# Patient Record
Sex: Female | Born: 1970
Health system: Southern US, Community
[De-identification: ages and names within clinical notes are randomized; demographics above are authoritative.]

## PROBLEM LIST (undated history)

## (undated) DIAGNOSIS — M722 Plantar fascial fibromatosis: Secondary | ICD-10-CM

## (undated) DIAGNOSIS — E669 Obesity, unspecified: Secondary | ICD-10-CM

## (undated) DIAGNOSIS — E161 Other hypoglycemia: Secondary | ICD-10-CM

## (undated) DIAGNOSIS — I10 Essential (primary) hypertension: Secondary | ICD-10-CM

## (undated) DIAGNOSIS — Z8742 Personal history of other diseases of the female genital tract: Secondary | ICD-10-CM

## (undated) DIAGNOSIS — N96 Recurrent pregnancy loss: Secondary | ICD-10-CM

## (undated) HISTORY — DX: Recurrent pregnancy loss: N96

## (undated) HISTORY — DX: Obesity, unspecified: E66.9

## (undated) HISTORY — DX: Essential (primary) hypertension: I10

## (undated) HISTORY — DX: Personal history of other diseases of the female genital tract: Z87.42

## (undated) HISTORY — DX: Plantar fascial fibromatosis: M72.2

## (undated) HISTORY — DX: Other hypoglycemia: E16.1

## (undated) HISTORY — PX: GALLBLADDER SURGERY: SHX652

---

## 2005-06-10 HISTORY — PX: CHOLECYSTECTOMY: SHX55

## 2005-09-03 ENCOUNTER — Emergency Department: Payer: Self-pay | Admitting: Emergency Medicine

## 2005-09-03 ENCOUNTER — Other Ambulatory Visit: Payer: Self-pay

## 2005-09-26 ENCOUNTER — Ambulatory Visit: Payer: Self-pay | Admitting: General Surgery

## 2008-10-16 LAB — HM PAP SMEAR: HM Pap smear: NORMAL

## 2009-04-10 ENCOUNTER — Ambulatory Visit: Payer: Self-pay | Admitting: Internal Medicine

## 2009-06-10 DIAGNOSIS — E161 Other hypoglycemia: Secondary | ICD-10-CM

## 2009-06-10 HISTORY — DX: Other hypoglycemia: E16.1

## 2011-07-05 ENCOUNTER — Other Ambulatory Visit: Payer: Self-pay | Admitting: *Deleted

## 2011-07-05 NOTE — Telephone Encounter (Signed)
Patient has not been seen in the office yet, but she does have an appt scheduled in a couple of weeks. She is asking if she can get this refilled until she is seen.

## 2011-07-10 ENCOUNTER — Encounter: Payer: Self-pay | Admitting: Internal Medicine

## 2011-07-11 MED ORDER — ZOLPIDEM TARTRATE 5 MG PO TABS: 5.0000 mg | ORAL_TABLET | Freq: Every evening | ORAL | Status: DC | PRN

## 2011-07-11 NOTE — Telephone Encounter (Signed)
Called to [facility]. 

## 2011-07-11 NOTE — Telephone Encounter (Signed)
Refill authorized.

## 2011-07-17 ENCOUNTER — Encounter: Payer: Self-pay | Admitting: Internal Medicine

## 2011-07-17 ENCOUNTER — Telehealth: Payer: Self-pay | Admitting: *Deleted

## 2011-07-17 ENCOUNTER — Ambulatory Visit (INDEPENDENT_AMBULATORY_CARE_PROVIDER_SITE_OTHER): Payer: PRIVATE HEALTH INSURANCE | Admitting: Internal Medicine

## 2011-07-17 DIAGNOSIS — I10 Essential (primary) hypertension: Secondary | ICD-10-CM | POA: Insufficient documentation

## 2011-07-17 DIAGNOSIS — Z6841 Body Mass Index (BMI) 40.0 and over, adult: Secondary | ICD-10-CM | POA: Insufficient documentation

## 2011-07-17 DIAGNOSIS — E669 Obesity, unspecified: Secondary | ICD-10-CM

## 2011-07-17 DIAGNOSIS — H6690 Otitis media, unspecified, unspecified ear: Secondary | ICD-10-CM

## 2011-07-17 MED ORDER — HYDROCORTISONE 0.5 % EX OINT: TOPICAL_OINTMENT | Freq: Two times a day (BID) | CUTANEOUS | Status: AC

## 2011-07-17 NOTE — Progress Notes (Signed)
  Subjective:    Patient ID: Melanie Chavez, female    DOB: [birth year], 41 y.o.   MRN: [id number]  HPI  Melanie Chavez is a 41 year old African American female with a history of reactive hypoglycemia secondary to diet, morbid obesity, migraine headaches, and hypertension who presents for followup. She was last seen one year ago by me at [facility]. Her chief complaint today is obesity. She has made some effort to restrict her diet by cutting back on fast food however she continues to snack on pretzels and crackers albeit whole-wheat, and has not begun exercising regularly yet. She and her 17 year old mother are talking about starting an exercise program but had not actually started. She has lost 5 pounds over the last year.   Past Medical History  Diagnosis Date  . Hypertension   . Obesity (BMI 30-39.9)   . H/O pelvic inflammatory disease   . Plantar fasciitis   . Reactive hypoglycemia 2011  . History of recurrent miscarriages, not currently pregnant     Past Surgical History  Procedure Date  . Cholecystectomy 2007    [facility]nett    Review of Systems  Constitutional: Negative for fever, chills and unexpected weight change.  HENT: Negative for hearing loss, ear pain, nosebleeds, congestion, sore throat, facial swelling, rhinorrhea, sneezing, mouth sores, trouble swallowing, neck pain, neck stiffness, voice change, postnasal drip, sinus pressure, tinnitus and ear discharge.   Eyes: Negative for pain, discharge, redness and visual disturbance.  Respiratory: Negative for cough, chest tightness, shortness of breath, wheezing and stridor.   Cardiovascular: Negative for chest pain, palpitations and leg swelling.  Musculoskeletal: Negative for myalgias and arthralgias.  Skin: Negative for color change and rash.  Neurological: Negative for dizziness, weakness, light-headedness and headaches.  Hematological: Negative for adenopathy.    BP 146/100  Pulse 84  Temp(Src) 98.4 F  (36.9 C) (Oral)  Ht 5' 8.25" (1.734 m)  Wt 261 lb (118.389 kg)  BMI 39.39 kg/m2  SpO2 100%  LMP 06/30/2011      Objective:   Physical Exam  Constitutional: She is oriented to person, place, and time. She appears well-developed and well-nourished.  HENT:  Mouth/Throat: Oropharynx is clear and moist.  Eyes: EOM are normal. Pupils are equal, round, and reactive to light. No scleral icterus.  Neck: Normal range of motion. Neck supple. No JVD present. No thyromegaly present.  Cardiovascular: Normal rate, regular rhythm, normal heart sounds and intact distal pulses.   Pulmonary/Chest: Effort normal and breath sounds normal.  Abdominal: Soft. Bowel sounds are normal. She exhibits no mass. There is no tenderness.  Musculoskeletal: Normal range of motion. She exhibits no edema.  Lymphadenopathy:    She has no cervical adenopathy.  Neurological: She is alert and oriented to person, place, and time.  Skin: Skin is warm and dry.  Psychiatric: She has a normal mood and affect.      Assessment & Plan:

## 2011-07-17 NOTE — Patient Instructions (Signed)
Go out and get the Dr. Vickey Sages New Diet Revolution book to read about the low carbohydrate diet.   Eat every 3 hours while awake to keep your metabolism going.    Consider almonds or roasted peanuts as a snack    Consider a lower carb bread; here are some choices available at [facility] and [employer]  Double fiber whole Wheat Sandwich thins;  They toast well. Jamse Belfast makes a pita bread and a flat bread that are 4 net carbs Mission makes a 6 net carb whole wheat tortilla  Consider the Atkins or the EAS Carb Control protein shakes as a breakfast or snack choice. ( [employer])  Use the Atkins or Fiber  one or Special K bars as your 3 hr in  between snack.  (or 1or 2 oz of nuts)

## 2011-07-17 NOTE — Telephone Encounter (Signed)
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

## 2011-07-17 NOTE — Telephone Encounter (Signed)
Patient is asking if you want her to return for labs before her cpx in may. If so what labs would you like for me to draw.

## 2011-07-18 ENCOUNTER — Encounter: Payer: Self-pay | Admitting: Internal Medicine

## 2011-07-18 DIAGNOSIS — M7672 Peroneal tendinitis, left leg: Secondary | ICD-10-CM | POA: Insufficient documentation

## 2011-07-18 DIAGNOSIS — E161 Other hypoglycemia: Secondary | ICD-10-CM | POA: Insufficient documentation

## 2011-07-18 DIAGNOSIS — N96 Recurrent pregnancy loss: Secondary | ICD-10-CM | POA: Insufficient documentation

## 2011-07-18 DIAGNOSIS — Z8742 Personal history of other diseases of the female genital tract: Secondary | ICD-10-CM | POA: Insufficient documentation

## 2011-07-18 NOTE — Telephone Encounter (Signed)
Left message asking patient to call me back

## 2011-07-18 NOTE — Assessment & Plan Note (Signed)
Her major obstacles to managing her obesity are lack of education about proper diet and lack of daily exercise. She is clearly motivated. I spent 30 minutes today discussing her diet with her and helping her choose a low carbohydrate diet gave given her history of of reactive hypoglycemia and excess carbohydrate consumption. I recommended that she obtain the Akin's new diet resolution paper back book for guidance and meal  planning and we'll see her back in 3 months for her annual physical. We will repeat a hemoglobin A1c fasting lipids and a cement prior to that visit.

## 2011-07-18 NOTE — Assessment & Plan Note (Addendum)
No controlled on current regimen. She has been asked to keep your blood pressure checked by employee health several times over the next month and to fax me the record the results. She's currently taking metoprolol 50 mg twice daily and lisinopril 20 mg daily we will consider increasing either medication based on the results of her repeat testing. She has normal renal function.

## 2011-07-22 NOTE — Telephone Encounter (Signed)
Left another message asking patient to call me back. 

## 2011-08-01 NOTE — Telephone Encounter (Signed)
Left message asking patient to call the office for lab appt or if she wants order for the hospital.

## 2011-08-12 ENCOUNTER — Other Ambulatory Visit: Payer: Self-pay | Admitting: Internal Medicine

## 2011-08-13 MED ORDER — ZOLPIDEM TARTRATE 5 MG PO TABS: 5.0000 mg | ORAL_TABLET | Freq: Every evening | ORAL | Status: DC | PRN

## 2011-10-17 ENCOUNTER — Encounter: Payer: Self-pay | Admitting: Internal Medicine

## 2011-10-17 ENCOUNTER — Ambulatory Visit (INDEPENDENT_AMBULATORY_CARE_PROVIDER_SITE_OTHER): Payer: PRIVATE HEALTH INSURANCE | Admitting: Internal Medicine

## 2011-10-17 ENCOUNTER — Telehealth: Payer: Self-pay | Admitting: Internal Medicine

## 2011-10-17 VITALS — BP 136/86 | HR 75 | Temp 98.3°F | Resp 16 | Wt 254.8 lb

## 2011-10-17 DIAGNOSIS — Z0000 Encounter for general adult medical examination without abnormal findings: Secondary | ICD-10-CM

## 2011-10-17 DIAGNOSIS — E669 Obesity, unspecified: Secondary | ICD-10-CM

## 2011-10-17 DIAGNOSIS — E162 Hypoglycemia, unspecified: Secondary | ICD-10-CM

## 2011-10-17 DIAGNOSIS — E161 Other hypoglycemia: Secondary | ICD-10-CM

## 2011-10-17 DIAGNOSIS — I10 Essential (primary) hypertension: Secondary | ICD-10-CM

## 2011-10-17 DIAGNOSIS — Z124 Encounter for screening for malignant neoplasm of cervix: Secondary | ICD-10-CM

## 2011-10-17 MED ORDER — METOPROLOL TARTRATE 50 MG PO TABS: 50.0000 mg | ORAL_TABLET | Freq: Two times a day (BID) | ORAL | Status: DC

## 2011-10-17 MED ORDER — LISINOPRIL 20 MG PO TABS: 20.0000 mg | ORAL_TABLET | Freq: Two times a day (BID) | ORAL | Status: DC

## 2011-10-17 MED ORDER — NORETHIN-ETH ESTRAD-FE BIPHAS 1 MG-10 MCG / 10 MCG PO TABS: 1.0000 | ORAL_TABLET | Freq: Every day | ORAL | Status: DC

## 2011-10-17 MED ORDER — ZOLPIDEM TARTRATE 5 MG PO TABS: 5.0000 mg | ORAL_TABLET | Freq: Every evening | ORAL | Status: DC | PRN

## 2011-10-17 NOTE — Assessment & Plan Note (Signed)
Repeat a1c ordered.

## 2011-10-17 NOTE — Assessment & Plan Note (Addendum)
Breast , pelvic/PAP done.  Mammogram ordered,  Her first.  No first degrees realtives with CA.  Vaginal culture for yeast was sent as  she had white dc ,  No history of sexual encounters in years.

## 2011-10-17 NOTE — Telephone Encounter (Signed)
Mammogram scheduled for 10/24/11 @ 7:30 arrive @7 :15  Left message on phone # [phone]

## 2011-10-17 NOTE — Progress Notes (Signed)
Patient ID: Melanie Chavez, female   DOB: [birth year], 41 y.o.   MRN: [id number]  Patient Active Problem List  Diagnoses  . Obesity (BMI 30-39.9)  . Hypertension  . H/O pelvic inflammatory disease  . Plantar fasciitis  . Reactive hypoglycemia  . History of recurrent miscarriages, not currently pregnant  . Routine general medical examination at a health care facility    Subjective:  CC:   Chief Complaint  Patient presents with  . Gynecologic Exam    HPI:   Melanie Randolphis a 41 y.o. female who presents For her Annual PE.  Her Last PAP smear was normal in 2010 by [place] and she is not sexually active.  She has been losing weight intentionally by exercising and reducing her caloric intake. Her entire family is involved, Her menstrual periods are regular but heavy and lasting a full 7 days .  She is wearing her seatbelts whenever driving or at passenger in a car. She is getting 7-8 hours sleep per night. She does not smoke. She does not drink alcohol. She has no history of current physical or domestic violence   Past Medical History  Diagnosis Date  . Hypertension   . Obesity (BMI 30-39.9)   . H/O pelvic inflammatory disease   . Plantar fasciitis   . Reactive hypoglycemia 2011  . History of recurrent miscarriages, not currently pregnant     Past Surgical History  Procedure Date  . Cholecystectomy 2007    [facility]nett         The following portions of the patient's history were reviewed and updated as appropriate: Allergies, current medications, and problem list.    Review of Systems:   12 Pt  review of systems was negative except those addressed in the HPI,     History   Social History  . Marital Status: Single    Spouse Name: N/A    Number of Children: N/A  . Years of Education: N/A   Occupational History  . Not on file.   Social History Main Topics  . Smoking status: Never Smoker   . Smokeless tobacco: Never Used  . Alcohol Use: Not on file  .  Drug Use: Not on file  . Sexually Active: Not on file   Other Topics Concern  . Not on file   Social History Narrative  . No narrative on file    Objective:  BP 136/86  Pulse 75  Temp(Src) 98.3 F (36.8 C) (Oral)  Resp 16  Wt 254 lb 12 oz (115.554 kg)  SpO2 96%  LMP 08/26/2011  General appearance: alert, cooperative and appears stated age Ears: normal TM's and external ear canals both ears Throat: lips, mucosa, and tongue normal; teeth and gums normal Neck: no adenopathy, no carotid bruit, supple, symmetrical, trachea midline and thyroid not enlarged, symmetric, no tenderness/mass/nodules Back: symmetric, no curvature. ROM normal. No CVA tenderness. Lungs: clear to auscultation bilaterally Heart: regular rate and rhythm, S1, S2 normal, no murmur, click, rub or gallop Abdomen: soft, non-tender; bowel sounds normal; no masses,  no organomegaly Pulses: 2+ and symmetric Skin: Skin color, texture, turgor normal. No rashes or lesions Lymph nodes: Cervical, supraclavicular, and axillary nodes normal.  Assessment and Plan:  Reactive hypoglycemia Repeat a1c ordered.   Routine general medical examination at a health care facility Breast , pelvic/PAP done.  Mammogram ordered,  Her first.  No first degrees realtives with CA.  Vaginal culture for yeast was sent as  she had white dc ,  No  history of sexual encounters in years.   Obesity (BMI 30-39.9) I congratulated her on her success in losing a few pounds over the last several months. We reviewed a low glycemic index diet again especially in light of her episodes of hypoglycemia which were occurring as of last year.  Hypertension Well controlled on current medications.  No changes today.     Updated Medication List Outpatient Encounter Prescriptions as of 10/17/2011  Medication Sig Dispense Refill  . hydrocortisone ointment 0.5 % Apply topically 2 (two) times daily.  30 g  0  . lisinopril (PRINIVIL,ZESTRIL) 20 MG tablet Take 1  tablet (20 mg total) by mouth 2 (two) times daily.  60 tablet  6  . metoprolol (LOPRESSOR) 50 MG tablet Take 1 tablet (50 mg total) by mouth 2 (two) times daily.  60 tablet  6  . zolpidem (AMBIEN) 5 MG tablet Take 1 tablet (5 mg total) by mouth at bedtime as needed for sleep.  30 tablet  3  . DISCONTD: lisinopril (PRINIVIL,ZESTRIL) 20 MG tablet Take 20 mg by mouth 2 (two) times daily.      [name] DISCONTD: metoprolol (LOPRESSOR) 50 MG tablet Take 50 mg by mouth 2 (two) times daily.      [name] DISCONTD: zolpidem (AMBIEN) 5 MG tablet Take 1 tablet (5 mg total) by mouth at bedtime as needed for sleep.  30 tablet  3  . Norethindrone-Ethinyl Estradiol-Fe Biphas (LO LOESTRIN FE) 1 MG-10 MCG / 10 MCG tablet Take 1 tablet by mouth daily.  1 Package  11     Orders Placed This Encounter  Procedures  . HM PAP SMEAR    No Follow-up on file.

## 2011-10-18 ENCOUNTER — Other Ambulatory Visit (HOSPITAL_COMMUNITY)
Admission: RE | Admit: 2011-10-18 | Discharge: 2011-10-18 | Disposition: A | Discharge status: Home / Self Care | Payer: PRIVATE HEALTH INSURANCE | Source: Ambulatory Visit | Attending: Internal Medicine | Admitting: Internal Medicine

## 2011-10-18 DIAGNOSIS — Z01419 Encounter for gynecological examination (general) (routine) without abnormal findings: Secondary | ICD-10-CM | POA: Insufficient documentation

## 2011-10-18 DIAGNOSIS — Z1159 Encounter for screening for other viral diseases: Secondary | ICD-10-CM | POA: Insufficient documentation

## 2011-10-20 NOTE — Assessment & Plan Note (Signed)
I congratulated her on her success in losing a few pounds over the last several months. We reviewed a low glycemic index diet again especially in light of her episodes of hypoglycemia which were occurring as of last year.

## 2011-10-20 NOTE — Assessment & Plan Note (Signed)
Well controlled on current medications.  No changes today. 

## 2011-10-21 LAB — HM PAP SMEAR: HM Pap smear: NORMAL

## 2011-10-31 ENCOUNTER — Ambulatory Visit: Payer: Self-pay | Admitting: Internal Medicine

## 2011-11-01 ENCOUNTER — Telehealth: Payer: Self-pay | Admitting: *Deleted

## 2011-11-01 NOTE — Telephone Encounter (Signed)
opened in error

## 2011-11-18 ENCOUNTER — Telehealth: Payer: Self-pay | Admitting: Internal Medicine

## 2011-11-18 DIAGNOSIS — N924 Excessive bleeding in the premenopausal period: Secondary | ICD-10-CM

## 2011-11-18 NOTE — Telephone Encounter (Signed)
I would like her to see a gynecologist.  Does she have a preference?

## 2011-11-18 NOTE — Telephone Encounter (Signed)
Patient called and stated she has been on her period for 2 weeks now, she stated you prescribed her a birth control pill but it is not working.  She wanted to know what she needed to do now.  Please advise.

## 2011-11-19 NOTE — Telephone Encounter (Signed)
Patient notified, she stated she does not have a preference.  Please advise.

## 2011-11-19 NOTE — Telephone Encounter (Signed)
I have made a referral to Dr. Patton Salles at [facility]

## 2011-12-04 ENCOUNTER — Other Ambulatory Visit: Payer: Self-pay | Admitting: Internal Medicine

## 2011-12-05 MED ORDER — ZOLPIDEM TARTRATE 5 MG PO TABS: 5.0000 mg | ORAL_TABLET | Freq: Every evening | ORAL | Status: DC | PRN

## 2011-12-05 NOTE — Telephone Encounter (Signed)
Rx called to pharmacy

## 2012-04-08 ENCOUNTER — Telehealth: Payer: Self-pay | Admitting: Internal Medicine

## 2012-04-08 MED ORDER — NORETHIN ACE-ETH ESTRAD-FE 1.5-30 MG-MCG PO TABS: 1.0000 | ORAL_TABLET | Freq: Every day | ORAL | Status: DC

## 2012-04-08 NOTE — Telephone Encounter (Signed)
Done, sent to [facility]

## 2012-04-08 NOTE — Telephone Encounter (Signed)
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

## 2012-04-09 NOTE — Telephone Encounter (Signed)
Notified patient that Rx has been changed and sent to [facility] pharmacy.

## 2012-04-10 ENCOUNTER — Other Ambulatory Visit: Payer: Self-pay | Admitting: Internal Medicine

## 2012-04-10 ENCOUNTER — Telehealth: Payer: Self-pay | Admitting: Internal Medicine

## 2012-04-10 MED ORDER — ZOLPIDEM TARTRATE 5 MG PO TABS: 5.0000 mg | ORAL_TABLET | Freq: Every evening | ORAL | Status: DC | PRN

## 2012-04-10 NOTE — Telephone Encounter (Signed)
Keep it at 5 mg.  thanks

## 2012-04-10 NOTE — Telephone Encounter (Signed)
Approved, please call in.

## 2012-04-10 NOTE — Telephone Encounter (Signed)
Request for controlled substance zolpidemtartrate 10 mg  30 tab Sig: take one tablet by mouth at bedtime as needed

## 2012-04-10 NOTE — Telephone Encounter (Signed)
Do you want to change the dose to 10 mg or leave it at 5 mg. Because under her medication list it says 5 mg.

## 2012-04-13 ENCOUNTER — Telehealth: Payer: Self-pay | Admitting: Internal Medicine

## 2012-04-13 NOTE — Telephone Encounter (Signed)
Pt is calling back and saying that her pharmacy never received the refill.

## 2012-04-13 NOTE — Telephone Encounter (Signed)
Error

## 2012-04-16 NOTE — Telephone Encounter (Signed)
Called [facility] pharmacy 11/6 and gave prescription over phone. Patient was notified.

## 2012-04-16 NOTE — Telephone Encounter (Signed)
[facility] said they received the fax for the patient's rx and it was faxed to the wrong place and wrong number ???

## 2012-08-12 ENCOUNTER — Telehealth: Payer: Self-pay | Admitting: General Practice

## 2012-08-12 MED ORDER — ZOLPIDEM TARTRATE 5 MG PO TABS: 5.0000 mg | ORAL_TABLET | Freq: Every evening | ORAL | Status: DC | PRN

## 2012-08-12 NOTE — Telephone Encounter (Signed)
Called stating she needs her Remus Loffler refilled there ar no more refills on her Rx. Pt uses [facility].

## 2012-08-12 NOTE — Telephone Encounter (Signed)
Ok to refill,  Authorized in epic 

## 2012-08-13 NOTE — Telephone Encounter (Signed)
Medication was faxed on 08/13/12 to [facility].

## 2012-10-29 ENCOUNTER — Ambulatory Visit (INDEPENDENT_AMBULATORY_CARE_PROVIDER_SITE_OTHER): Payer: 59 | Admitting: Internal Medicine

## 2012-10-29 ENCOUNTER — Encounter: Payer: Self-pay | Admitting: Internal Medicine

## 2012-10-29 VITALS — BP 132/82 | HR 75 | Temp 98.9°F | Resp 16 | Ht 65.0 in | Wt 246.8 lb

## 2012-10-29 DIAGNOSIS — Z13220 Encounter for screening for lipoid disorders: Secondary | ICD-10-CM

## 2012-10-29 DIAGNOSIS — E669 Obesity, unspecified: Secondary | ICD-10-CM

## 2012-10-29 DIAGNOSIS — Z0000 Encounter for general adult medical examination without abnormal findings: Secondary | ICD-10-CM

## 2012-10-29 DIAGNOSIS — R5381 Other malaise: Secondary | ICD-10-CM

## 2012-10-29 NOTE — Patient Instructions (Signed)
Come by ASAP for fasting bloodwork  Nystatin ointment for the rash ,  Use twice daily until resolved,  Then ok to use gold bond in the groin but not the vaginal fold.   Refills on meds once i check  your kidney function,  Etc    This is  my version of a  "Low GI"  Diet:  It is not ultra low carb, but will still lower your blood sugars and allow you to lose 5 to 10 lbs per month if you follow it carefully. All of the foods can be found at grocery stores and in bulk at [employer].  The Atkins protein bars and shakes are available in more varieties at [employer], [employer] and [employer].     7 AM Breakfast:  Low carbohydrate Protein  Shakes (I recommend the EAS AdvantEdge "Carb Control" shakes  Or the low carb shakes by Atkins.   Both are available everywhere:  In  cases at [employer]Js  Or in 4 packs at grocery stores and pharmacies  2.5 carbs  (Alternative is  a toasted Arnold's Sandwhich Thin w/ peanut butter, a "Bagel Thin" with cream cheese and salmon) or  a scrambled egg burrito made with a low carb tortilla .  Avoid cereal and bananas, oatmeal too unless you are cooking the old fashioned kind that takes 30-40 minutes to prepare.  the rest is overly processed, has minimal fiber, and is loaded with carbohydrates!   10 AM: Protein bar by Atkins (the snack size, under 200 cal).  There are many varieties , available widely again or in bulk in limited varieties at [employer]Js)  Other so called "protein bars" tend to be loaded with carbohydrates.  Remember, in food advertising, the word "energy" is synonymous for " carbohydrate."  Lunch: sandwich of [place], (or any lunchmeat, grilled meat or canned tuna), fresh avocado, mayonnaise  and cheese on a lower carbohydrate pita bread, flatbread, or tortilla . Ok to use regular mayonnaise. The bread is the only source or carbohydrate that can be decreased (Joseph's makes a pita bread and a flat bread that are 50 cal and 4 net carbs ; Toufayan makes a low carb flatbread that's  100 cal and 9 net carbs  and  Mission makes a low carb whole wheat tortilla  That is 210 cal and 6 net carbs)  3 PM:  Mid day :  Another protein bar,  Or a  cheese stick (100 cal, 0 carbs),  Or 1 ounce of  almonds, walnuts, pistachios, pecans, peanuts,  Macadamia nuts. Or a Dannon light n Fit greek yogurt, 80 cal 8 net carbs . Avoid "granola"; the dried cranberries and raisins are loaded with carbohydrates. Mixed nuts ok if no raisins or cranberries or dried fruit.      6 PM  Dinner:  "mean and green:"  Meat/chicken/fish or a high protein legume; , with a green salad, and a low GI  Veggie (broccoli, cauliflower, green beans, spinach, brussel sprouts. Lima beans) : Avoid "Low fat dressings, as well as Reyne Dumas and [address]! They are loaded with sugar! Instead use ranch, vinagrette,  Blue cheese, etc.  There is a low carb pasta by Dreamfield's available at [facility] that is acceptable and tastes great. Try Michel Angel's chicken piccata over low carb pasta. The chicken dish is 0 carbs, and can be found in frozen section at BJs and [employer]'s. Also try [employer] "Carnitas" (pulled pork, no sauce,  0 carbs) and his  pot roast.   both are in the refrigerated section at BJs   Dreamfield's makes a low carb pasta only 5 g/serving.  Available at all grocery stores,  And tastes like normal pasta  9 PM snack : Breyer's "low carb" fudgsicle or  ice cream bar (Carb Smart line), or  Weight Watcher's ice cream bar , or another "no sugar added" ice cream;a serving of fresh berries/cherries with whipped cream (Avoid bananas, pineapple, grapes  and watermelon on a regular basis because they are high in sugar)   Remember that snack Substitutions should be less than 10 carbs per serving and meals < 20 carbs. Remember to subtract fiber grams and sugar alcohols to get the "net carbs."

## 2012-11-01 NOTE — Progress Notes (Signed)
Subjective:     Melanie Chavez is a 42 y.o. female and is here for a comprehensive physical exam. The patient reports no problems.  History   Social History  . Marital Status: Single    Spouse Name: N/A    Number of Children: N/A  . Years of Education: N/A   Occupational History  . Not on file.   Social History Main Topics  . Smoking status: Never Smoker   . Smokeless tobacco: Never Used  . Alcohol Use: Not on file  . Drug Use: Not on file  . Sexually Active: Not on file   Other Topics Concern  . Not on file   Social History Narrative  . No narrative on file   Health Maintenance  Topic Date Due  . Tetanus/tdap  03/16/1991  . Influenza Vaccine  02/08/2013  . Pap Smear  10/18/2014    The following portions of the patient's history were reviewed and updated as appropriate: allergies, current medications, past family history, past medical history, past social history, past surgical history and problem list.  Review of Systems A comprehensive review of systems was negative.   Objective:   BP 132/82  Pulse 75  Temp(Src) 98.9 F (37.2 C) (Oral)  Resp 16  Ht 5\' 5"  (1.651 m)  Wt 246 lb 12 oz (111.925 kg)  BMI 41.06 kg/m2  SpO2 99%  LMP 10/01/2012  General Appearance:    Alert, obese, no distress, appears stated age  Head:    Normocephalic, without obvious abnormality, atraumatic  Eyes:    PERRL, conjunctiva/corneas clear, EOM's intact, fundi    benign, both eyes  Ears:    Normal TM's and external ear canals, both ears  Nose:   Nares normal, septum midline, mucosa normal, no drainage    or sinus tenderness  Throat:   Lips, mucosa, and tongue normal; teeth and gums normal  Neck:   Supple, symmetrical, trachea midline, no adenopathy;    thyroid:  no enlargement/tenderness/nodules; no carotid   bruit or JVD  Back:     Symmetric, no curvature, ROM normal, no CVA tenderness  Lungs:     Clear to auscultation bilaterally, respirations unlabored  Chest Wall:    No  tenderness or deformity   Heart:    Regular rate and rhythm, S1 and S2 normal, no murmur, rub   or gallop  Breast Exam:    Pendulous without  tenderness, masses, or nipple abnormality  Abdomen:     Soft, non-tender, bowel sounds active all four quadrants,    no masses, no organomegaly  Genitalia:    Pelvic: cervix normal in appearance, external genitalia normal, no adnexal masses or tenderness, no cervical motion tenderness, rectovaginal septum normal, uterus normal size, shape, and consistency and vagina normal without discharge  Extremities:   Extremities normal, atraumatic, no cyanosis or edema  Pulses:   2+ and symmetric all extremities  Skin:   Skin color, texture, turgor normal, no rashes or lesions  Lymph nodes:   Cervical, supraclavicular, and axillary nodes normal  Neurologic:   CNII-XII intact, normal strength, sensation and reflexes    throughout    Assessment:   Routine general medical examination at a health care facility Annual comprehensive exam was done including breast, pelvic and PAP smear. All screenings have been addressed .   Obesity (BMI 30-39.9) I have addressed  BMI and recommended a low glycemic index diet utilizing smaller more frequent meals to increase metabolism.  I have also recommended that patient start  exercising with a goal of 30 minutes of aerobic exercise a minimum of 5 days per week.   Updated Medication List Outpatient Encounter Prescriptions as of 10/29/2012  Medication Sig Dispense Refill  . lisinopril (PRINIVIL,ZESTRIL) 20 MG tablet Take 1 tablet (20 mg total) by mouth 2 (two) times daily.  60 tablet  6  . metoprolol (LOPRESSOR) 50 MG tablet Take 1 tablet (50 mg total) by mouth 2 (two) times daily.  60 tablet  6  . norethindrone-ethinyl estradiol-iron (MICROGESTIN FE 1.5/30) 1.5-30 MG-MCG tablet Take 1 tablet by mouth daily.  1 Package  11  . zolpidem (AMBIEN) 5 MG tablet Take 1 tablet (5 mg total) by mouth at bedtime as needed for sleep.  30  tablet  3   No facility-administered encounter medications on file as of 10/29/2012.

## 2012-11-01 NOTE — Assessment & Plan Note (Signed)
I have addressed  BMI and recommended a low glycemic index diet utilizing smaller more frequent meals to increase metabolism.  I have also recommended that patient start exercising with a goal of 30 minutes of aerobic exercise a minimum of 5 days per week.  

## 2012-11-01 NOTE — Assessment & Plan Note (Signed)
Annual comprehensive exam was done including breast, pelvic and PAP smear. All screenings have been addressed .  

## 2012-11-03 ENCOUNTER — Telehealth: Payer: Self-pay

## 2012-11-03 MED ORDER — NYSTATIN 100000 UNIT/GM EX OINT: TOPICAL_OINTMENT | Freq: Two times a day (BID) | CUTANEOUS | Status: DC

## 2012-11-03 NOTE — Telephone Encounter (Signed)
Patient stated that she wanted to know was she suppose to get her Nystatin cream over the counter or will it be prescribed for her itching.

## 2012-11-03 NOTE — Telephone Encounter (Signed)
Patient notified that her prescription is sent to [facility]

## 2012-11-03 NOTE — Telephone Encounter (Signed)
My apologies.  It is prescribed and has been sent to [facility] this morning.

## 2012-11-12 ENCOUNTER — Other Ambulatory Visit: Payer: Self-pay | Admitting: *Deleted

## 2012-11-12 MED ORDER — METOPROLOL TARTRATE 50 MG PO TABS: 50.0000 mg | ORAL_TABLET | Freq: Two times a day (BID) | ORAL | Status: DC

## 2012-11-18 ENCOUNTER — Other Ambulatory Visit (INDEPENDENT_AMBULATORY_CARE_PROVIDER_SITE_OTHER): Payer: 59

## 2012-11-18 DIAGNOSIS — Z13220 Encounter for screening for lipoid disorders: Secondary | ICD-10-CM

## 2012-11-18 DIAGNOSIS — E669 Obesity, unspecified: Secondary | ICD-10-CM

## 2012-11-18 DIAGNOSIS — R5381 Other malaise: Secondary | ICD-10-CM

## 2012-11-18 LAB — CBC WITH DIFFERENTIAL/PLATELET
Basophils Absolute: 0 10*3/uL (ref 0.0–0.1)
Eosinophils Absolute: 0.1 10*3/uL (ref 0.0–0.7)
Lymphocytes Relative: 35.3 % (ref 12.0–46.0)
MCHC: 34.1 g/dL (ref 30.0–36.0)
Monocytes Absolute: 0.4 10*3/uL (ref 0.1–1.0)
Neutrophils Relative %: 53.7 % (ref 43.0–77.0)
Platelets: 317 10*3/uL (ref 150.0–400.0)
RBC: 3.95 Mil/uL (ref 3.87–5.11)
RDW: 12.9 % (ref 11.5–14.6)

## 2012-11-18 LAB — COMPREHENSIVE METABOLIC PANEL
ALT: 12 U/L (ref 0–35)
AST: 17 U/L (ref 0–37)
Albumin: 3.7 g/dL (ref 3.5–5.2)
Alkaline Phosphatase: 37 U/L — ABNORMAL LOW (ref 39–117)
Calcium: 8.8 mg/dL (ref 8.4–10.5)
Chloride: 108 mEq/L (ref 96–112)
Potassium: 3.8 mEq/L (ref 3.5–5.1)
Sodium: 138 mEq/L (ref 135–145)

## 2012-11-18 LAB — LIPID PANEL
Cholesterol: 125 mg/dL (ref 0–200)
HDL: 37.5 mg/dL — ABNORMAL LOW (ref >39.00)
LDL Cholesterol: 80 mg/dL (ref 0–99)
Triglycerides: 38 mg/dL (ref 0.0–149.0)
VLDL: 7.6 mg/dL (ref 0.0–40.0)

## 2012-12-07 ENCOUNTER — Other Ambulatory Visit: Payer: Self-pay | Admitting: *Deleted

## 2012-12-07 NOTE — Telephone Encounter (Signed)
Last OV 10/29/12, refill?

## 2012-12-08 MED ORDER — ZOLPIDEM TARTRATE 5 MG PO TABS: 5.0000 mg | ORAL_TABLET | Freq: Every evening | ORAL | Status: DC | PRN

## 2012-12-08 NOTE — Telephone Encounter (Signed)
Rx faxed to pharmacy  

## 2012-12-30 ENCOUNTER — Other Ambulatory Visit: Payer: Self-pay | Admitting: *Deleted

## 2012-12-30 MED ORDER — LISINOPRIL 20 MG PO TABS: 20.0000 mg | ORAL_TABLET | Freq: Two times a day (BID) | ORAL | Status: DC

## 2013-02-25 ENCOUNTER — Telehealth: Payer: Self-pay | Admitting: *Deleted

## 2013-02-25 NOTE — Telephone Encounter (Signed)
Refill Request  Junel FE 1.5-30 tablet   #84  Take one tablet by mouth daily

## 2013-03-05 MED ORDER — NORETHIN ACE-ETH ESTRAD-FE 1.5-30 MG-MCG PO TABS: 1.0000 | ORAL_TABLET | Freq: Every day | ORAL | Status: DC

## 2013-03-05 NOTE — Telephone Encounter (Signed)
Script sent  

## 2013-04-08 ENCOUNTER — Other Ambulatory Visit: Payer: Self-pay | Admitting: *Deleted

## 2013-04-08 MED ORDER — ZOLPIDEM TARTRATE 5 MG PO TABS: 5.0000 mg | ORAL_TABLET | Freq: Every evening | ORAL | Status: DC | PRN

## 2013-04-08 NOTE — Telephone Encounter (Signed)
Pt notified and verbalized understanding.

## 2013-04-08 NOTE — Telephone Encounter (Signed)
30 DAYS ONLY  SINCE SHE WILL BE DUE FOR 6 MONTH FOLLOW UP . PLEASE NOTOFY PATIENT

## 2013-04-08 NOTE — Telephone Encounter (Signed)
Last visit 10/29/12, refill?

## 2013-04-30 ENCOUNTER — Encounter: Payer: Self-pay | Admitting: *Deleted

## 2013-05-03 ENCOUNTER — Encounter: Payer: Self-pay | Admitting: Internal Medicine

## 2013-05-03 ENCOUNTER — Telehealth: Payer: Self-pay | Admitting: Internal Medicine

## 2013-05-03 ENCOUNTER — Ambulatory Visit (INDEPENDENT_AMBULATORY_CARE_PROVIDER_SITE_OTHER): Payer: 59 | Admitting: Internal Medicine

## 2013-05-03 ENCOUNTER — Encounter (INDEPENDENT_AMBULATORY_CARE_PROVIDER_SITE_OTHER): Payer: Self-pay

## 2013-05-03 VITALS — BP 150/92 | HR 68 | Temp 98.6°F | Resp 12 | Ht 65.0 in | Wt 250.8 lb

## 2013-05-03 DIAGNOSIS — Z6841 Body Mass Index (BMI) 40.0 and over, adult: Secondary | ICD-10-CM

## 2013-05-03 DIAGNOSIS — I10 Essential (primary) hypertension: Secondary | ICD-10-CM

## 2013-05-03 DIAGNOSIS — R0683 Snoring: Secondary | ICD-10-CM

## 2013-05-03 DIAGNOSIS — R0609 Other forms of dyspnea: Secondary | ICD-10-CM

## 2013-05-03 MED ORDER — LOSARTAN POTASSIUM 50 MG PO TABS: 50.0000 mg | ORAL_TABLET | Freq: Every day | ORAL | Status: DC

## 2013-05-03 MED ORDER — ZOLPIDEM TARTRATE 5 MG PO TABS: 5.0000 mg | ORAL_TABLET | Freq: Every evening | ORAL | Status: DC | PRN

## 2013-05-03 MED ORDER — METOPROLOL SUCCINATE ER 50 MG PO TB24: 50.0000 mg | ORAL_TABLET | Freq: Every day | ORAL | Status: DC

## 2013-05-03 NOTE — Progress Notes (Signed)
Pre-visit discussion using our clinic review tool. No additional management support is needed unless otherwise documented below in the visit note.  

## 2013-05-03 NOTE — Assessment & Plan Note (Addendum)
Not well controlled on current regimen. Changing meds to toprol XL and losartan 50 mg daily.

## 2013-05-03 NOTE — Patient Instructions (Signed)
Your blood pressure is elevated today I am changing your metoprolol to once daily , at  same dose, and I am changing your  lisinopril to once daily losartan 50 mg . (let me know if either is too expensive)  Please get your blood pressure rechecked after a week or so.  Goal is 130/80 or less   You need fasting labs done in December.  You can return here for them  I am referring you for a sleep study   Hypertension As your heart beats, it forces blood through your arteries. This force is your blood pressure. If the pressure is too high, it is called hypertension (HTN) or high blood pressure. HTN is dangerous because you may have it and not know it. High blood pressure may mean that your heart has to work harder to pump blood. Your arteries may be narrow or stiff. The extra work puts you at risk for heart disease, stroke, and other problems.  Blood pressure consists of two numbers, a higher number over a lower, 110/72, for example. It is stated as "110 over 72." The ideal is below 120 for the top number (systolic) and under 80 for the bottom (diastolic). Write down your blood pressure today. You should pay close attention to your blood pressure if you have certain conditions such as:  Heart failure.  Prior heart attack.  Diabetes  Chronic kidney disease.  Prior stroke.  Multiple risk factors for heart disease. To see if you have HTN, your blood pressure should be measured while you are seated with your arm held at the level of the heart. It should be measured at least twice. A one-time elevated blood pressure reading (especially in the Emergency Department) does not mean that you need treatment. There may be conditions in which the blood pressure is different between your right and left arms. It is important to see your caregiver soon for a recheck. Most people have essential hypertension which means that there is not a specific cause. This type of high blood pressure may be lowered by changing  lifestyle factors such as:  Stress.  Smoking.  Lack of exercise.  Excessive weight.  Drug/tobacco/alcohol use.  Eating less salt. Most people do not have symptoms from high blood pressure until it has caused damage to the body. Effective treatment can often prevent, delay or reduce that damage. TREATMENT  When a cause has been identified, treatment for high blood pressure is directed at the cause. There are a large number of medications to treat HTN. These fall into several categories, and your caregiver will help you select the medicines that are best for you. Medications may have side effects. You should review side effects with your caregiver. If your blood pressure stays high after you have made lifestyle changes or started on medicines,   Your medication(s) may need to be changed.  Other problems may need to be addressed.  Be certain you understand your prescriptions, and know how and when to take your medicine.  Be sure to follow up with your caregiver within the time frame advised (usually within two weeks) to have your blood pressure rechecked and to review your medications.  If you are taking more than one medicine to lower your blood pressure, make sure you know how and at what times they should be taken. Taking two medicines at the same time can result in blood pressure that is too low. SEEK IMMEDIATE MEDICAL CARE IF:  You develop a severe headache, blurred or  changing vision, or confusion.  You have unusual weakness or numbness, or a faint feeling.  You have severe chest or abdominal pain, vomiting, or breathing problems. MAKE SURE YOU:   Understand these instructions.  Will watch your condition.  Will get help right away if you are not doing well or get worse. Document Released: 05/27/2005 Document Revised: 08/19/2011 Document Reviewed: 01/15/2008 [facility] Patient Information 2014 [employer] [place].

## 2013-05-03 NOTE — Progress Notes (Signed)
Patient ID: Melanie Chavez, female   DOB: [birth year], 42 y.o.   MRN: [id number]  Patient Active Problem List   Diagnosis Date Noted  . Snoring disorder 05/03/2013  . Routine general medical examination at a health care facility 10/17/2011  . H/O pelvic inflammatory disease   . Plantar fasciitis   . Reactive hypoglycemia   . History of recurrent miscarriages, not currently pregnant   . Morbid obesity with BMI of 40.0-44.9, adult 07/17/2011  . Hypertension 07/17/2011    Subjective:  CC:   Chief Complaint  Patient presents with  . Follow-up    medication refills    HPI:   Melanie Chavez a 42 y.o. female who presents for a  6 month follow up on hypertension, obesity and snoring disorder.  Taking her medications as directed.  Walking regularly but has gained 4 lb since May .  Reports loud snoring reported by daughter . Some daytime hypersomnolence in the mid afternoon.      Past Medical History  Diagnosis Date  . Hypertension   . Obesity (BMI 30-39.9)   . H/O pelvic inflammatory disease   . Plantar fasciitis   . Reactive hypoglycemia 2011  . History of recurrent miscarriages, not currently pregnant     Past Surgical History  Procedure Laterality Date  . Cholecystectomy  2007    [place]       The following portions of the patient's history were reviewed and updated as appropriate: Allergies, current medications, and problem list.    Review of Systems:   12 Pt  review of systems was negative except those addressed in the HPI,     History   Social History  . Marital Status: Single    Spouse Name: N/A    Number of Children: N/A  . Years of Education: N/A   Occupational History  . Not on file.   Social History Main Topics  . Smoking status: Never Smoker   . Smokeless tobacco: Never Used  . Alcohol Use: Not on file  . Drug Use: Not on file  . Sexual Activity: Not on file   Other Topics Concern  . Not on file   Social History Narrative  . No  narrative on file    Objective:  Filed Vitals:   05/03/13 0820  BP: 150/92  Pulse: 68  Temp: 98.6 F (37 C)  Resp: 12     General appearance: alert, cooperative and appears stated age Ears: normal TM's and external ear canals both ears Throat: lips, mucosa, and tongue normal; teeth and gums normal Neck: no adenopathy, no carotid bruit, supple, symmetrical, trachea midline and thyroid not enlarged, symmetric, no tenderness/mass/nodules Back: symmetric, no curvature. ROM normal. No CVA tenderness. Lungs: clear to auscultation bilaterally Heart: regular rate and rhythm, S1, S2 normal, no murmur, click, rub or gallop Abdomen: soft, non-tender; bowel sounds normal; no masses,  no organomegaly Pulses: 2+ and symmetric Skin: Skin color, texture, turgor normal. No rashes or lesions Lymph nodes: Cervical, supraclavicular, and axillary nodes normal.  Assessment and Plan:  Hypertension Not well controlled on current regimen. Changing meds to toprol XL and losartan 50 mg daily.   Morbid obesity with BMI of 40.0-44.9, adult I have addressed  BMI and recommended wt loss of 10% of body weigh over the next 6 months using a low glycemic index diet and regular exercise a minimum of 5 days per week.    Snoring disorder Given her morbid obesity, snoring disorder and worsening hypertension,  Referral to  Sleep Center to rule out OSA.   Updated Medication List Outpatient Encounter Prescriptions as of 05/03/2013  Medication Sig  . norethindrone-ethinyl estradiol-iron (MICROGESTIN FE 1.5/30) 1.5-30 MG-MCG tablet Take 1 tablet by mouth daily.  [name] nystatin ointment (MYCOSTATIN) Apply topically 2 (two) times daily. Until rash resolves  . zolpidem (AMBIEN) 5 MG tablet Take 1 tablet (5 mg total) by mouth at bedtime as needed for sleep.  . [DISCONTINUED] lisinopril (PRINIVIL,ZESTRIL) 20 MG tablet Take 1 tablet (20 mg total) by mouth 2 (two) times daily.  . [DISCONTINUED] metoprolol (LOPRESSOR) 50 MG  tablet Take 1 tablet (50 mg total) by mouth 2 (two) times daily.  . [DISCONTINUED] zolpidem (AMBIEN) 5 MG tablet Take 1 tablet (5 mg total) by mouth at bedtime as needed for sleep.  [name] losartan (COZAAR) 50 MG tablet Take 1 tablet (50 mg total) by mouth daily.  . metoprolol succinate (TOPROL-XL) 50 MG 24 hr tablet Take 1 tablet (50 mg total) by mouth daily. Take with or immediately following a meal.     Orders Placed This Encounter  Procedures  . Ambulatory referral to Sleep Studies    No Follow-up on file.

## 2013-05-03 NOTE — Assessment & Plan Note (Signed)
Given her morbid obesity, snoring disorder and worsening hypertension,  Referral to Sleep Center to rule out OSA.

## 2013-05-03 NOTE — Assessment & Plan Note (Signed)
I have addressed  BMI and recommended wt loss of 10% of body weigh over the next 6 months using a low glycemic index diet and regular exercise a minimum of 5 days per week.   

## 2013-05-18 ENCOUNTER — Encounter: Payer: Self-pay | Admitting: Emergency Medicine

## 2013-05-21 ENCOUNTER — Telehealth: Payer: Self-pay | Admitting: Internal Medicine

## 2013-05-21 MED ORDER — LOSARTAN POTASSIUM-HCTZ 50-12.5 MG PO TABS: 1.0000 | ORAL_TABLET | Freq: Every day | ORAL | Status: DC

## 2013-05-21 NOTE — Telephone Encounter (Signed)
Last week in the afternoon 150/90 and patient takes Bp meds in the am and Losartan 50 mg was added in November. Please advise if changes needed,

## 2013-05-21 NOTE — Telephone Encounter (Signed)
Yes stop the losartan 50 mg and start the losartan hct that I just Called in to pharmacy .  If bp is still > 130/80 after one week on new pill,  Add the old losartan50 mg to the new one (take at same time)

## 2013-05-21 NOTE — Telephone Encounter (Signed)
Wanting to make Dr. Darrick Huntsman aware that she is still having high BP with the recent medication change made in November.

## 2013-05-24 NOTE — Telephone Encounter (Signed)
Left message for patient to call office.  

## 2013-05-27 ENCOUNTER — Encounter: Payer: Self-pay | Admitting: Internal Medicine

## 2013-05-28 NOTE — Telephone Encounter (Signed)
Pt notified and verbalized understanding.

## 2013-06-24 ENCOUNTER — Telehealth: Payer: Self-pay | Admitting: Internal Medicine

## 2013-06-24 DIAGNOSIS — E669 Obesity, unspecified: Secondary | ICD-10-CM

## 2013-06-24 DIAGNOSIS — R5383 Other fatigue: Secondary | ICD-10-CM

## 2013-06-24 DIAGNOSIS — E785 Hyperlipidemia, unspecified: Secondary | ICD-10-CM

## 2013-06-24 DIAGNOSIS — Z8639 Personal history of other endocrine, nutritional and metabolic disease: Secondary | ICD-10-CM

## 2013-06-24 DIAGNOSIS — R5381 Other malaise: Secondary | ICD-10-CM

## 2013-06-24 NOTE — Telephone Encounter (Signed)
Pt states she needs to come in for fasting blood work.  No orders in.  Pt would like a call when orders are in so she can schedule.  States she was to have come in December for fasting blood work, so is late.  No lab appt scheduled at this time.

## 2013-06-24 NOTE — Telephone Encounter (Signed)
Labs ordered,  Make appt

## 2013-06-24 NOTE — Telephone Encounter (Signed)
Please advise 

## 2013-06-25 NOTE — Telephone Encounter (Signed)
Patient notified and stated she will call back for an appointment for labs and follow up.

## 2013-11-03 ENCOUNTER — Other Ambulatory Visit: Payer: Self-pay | Admitting: Internal Medicine

## 2013-11-03 NOTE — Telephone Encounter (Signed)
Last visit 05/03/13

## 2013-11-03 NOTE — Telephone Encounter (Signed)
Ok to refill,  printed rx  

## 2013-11-18 ENCOUNTER — Encounter: Payer: Self-pay | Admitting: Internal Medicine

## 2013-12-17 ENCOUNTER — Other Ambulatory Visit: Payer: 59

## 2013-12-20 ENCOUNTER — Encounter: Payer: Self-pay | Admitting: Internal Medicine

## 2013-12-20 ENCOUNTER — Other Ambulatory Visit (INDEPENDENT_AMBULATORY_CARE_PROVIDER_SITE_OTHER): Payer: 59

## 2013-12-20 ENCOUNTER — Ambulatory Visit (INDEPENDENT_AMBULATORY_CARE_PROVIDER_SITE_OTHER): Payer: 59 | Admitting: Internal Medicine

## 2013-12-20 VITALS — BP 148/90 | HR 99 | Temp 98.2°F | Resp 18 | Ht 65.0 in | Wt 267.8 lb

## 2013-12-20 DIAGNOSIS — R5383 Other fatigue: Principal | ICD-10-CM

## 2013-12-20 DIAGNOSIS — E785 Hyperlipidemia, unspecified: Secondary | ICD-10-CM

## 2013-12-20 DIAGNOSIS — Z8639 Personal history of other endocrine, nutritional and metabolic disease: Secondary | ICD-10-CM

## 2013-12-20 DIAGNOSIS — Z1239 Encounter for other screening for malignant neoplasm of breast: Secondary | ICD-10-CM

## 2013-12-20 DIAGNOSIS — R5381 Other malaise: Secondary | ICD-10-CM

## 2013-12-20 DIAGNOSIS — Z0000 Encounter for general adult medical examination without abnormal findings: Secondary | ICD-10-CM

## 2013-12-20 DIAGNOSIS — R0989 Other specified symptoms and signs involving the circulatory and respiratory systems: Secondary | ICD-10-CM

## 2013-12-20 DIAGNOSIS — I10 Essential (primary) hypertension: Secondary | ICD-10-CM

## 2013-12-20 DIAGNOSIS — R0683 Snoring: Secondary | ICD-10-CM

## 2013-12-20 DIAGNOSIS — Z862 Personal history of diseases of the blood and blood-forming organs and certain disorders involving the immune mechanism: Secondary | ICD-10-CM

## 2013-12-20 DIAGNOSIS — Z6841 Body Mass Index (BMI) 40.0 and over, adult: Secondary | ICD-10-CM

## 2013-12-20 DIAGNOSIS — R0609 Other forms of dyspnea: Secondary | ICD-10-CM

## 2013-12-20 LAB — CBC WITH DIFFERENTIAL/PLATELET
BASOS PCT: 1.4 % (ref 0.0–3.0)
Basophils Absolute: 0.1 10*3/uL (ref 0.0–0.1)
EOS PCT: 3 % (ref 0.0–5.0)
Eosinophils Absolute: 0.1 10*3/uL (ref 0.0–0.7)
HEMATOCRIT: 38 % (ref 36.0–46.0)
Hemoglobin: 12.3 g/dL (ref 12.0–15.0)
LYMPHS ABS: 1 10*3/uL (ref 0.7–4.0)
Lymphocytes Relative: 20.5 % (ref 12.0–46.0)
MCHC: 32.3 g/dL (ref 30.0–36.0)
MCV: 95 fl (ref 78.0–100.0)
MONO ABS: 0.5 10*3/uL (ref 0.1–1.0)
Monocytes Relative: 10.2 % (ref 3.0–12.0)
Neutro Abs: 3.2 10*3/uL (ref 1.4–7.7)
Neutrophils Relative %: 64.9 % (ref 43.0–77.0)
Platelets: 348 10*3/uL (ref 150.0–400.0)
RBC: 3.99 Mil/uL (ref 3.87–5.11)
RDW: 13.4 % (ref 11.5–15.5)
WBC: 5 10*3/uL (ref 4.0–10.5)

## 2013-12-20 LAB — COMPREHENSIVE METABOLIC PANEL
ALK PHOS: 39 U/L (ref 39–117)
ALT: 14 U/L (ref 0–35)
AST: 27 U/L (ref 0–37)
Albumin: 3.6 g/dL (ref 3.5–5.2)
BILIRUBIN TOTAL: 0.5 mg/dL (ref 0.2–1.2)
BUN: 10 mg/dL (ref 6–23)
CO2: 24 mEq/L (ref 19–32)
Calcium: 8.6 mg/dL (ref 8.4–10.5)
Chloride: 110 mEq/L (ref 96–112)
Creatinine, Ser: 0.7 mg/dL (ref 0.4–1.2)
GFR: 122.17 mL/min (ref >60.00)
GLUCOSE: 84 mg/dL (ref 70–99)
Potassium: 4.1 mEq/L (ref 3.5–5.1)
Sodium: 139 mEq/L (ref 135–145)
Total Protein: 7.2 g/dL (ref 6.0–8.3)

## 2013-12-20 LAB — LIPID PANEL
Cholesterol: 112 mg/dL (ref 0–200)
HDL: 36.8 mg/dL — AB (ref >39.00)
LDL Cholesterol: 67 mg/dL (ref 0–99)
NonHDL: 75.2
Total CHOL/HDL Ratio: 3
Triglycerides: 43 mg/dL (ref 0.0–149.0)
VLDL: 8.6 mg/dL (ref 0.0–40.0)

## 2013-12-20 LAB — TSH: TSH: 0.99 u[IU]/mL (ref 0.35–4.50)

## 2013-12-20 LAB — HEMOGLOBIN A1C: Hgb A1c MFr Bld: 5.5 % (ref 4.6–6.5)

## 2013-12-20 MED ORDER — METOPROLOL SUCCINATE ER 50 MG PO TB24: 50.0000 mg | ORAL_TABLET | Freq: Every day | ORAL | Status: DC

## 2013-12-20 MED ORDER — ZOLPIDEM TARTRATE 5 MG PO TABS: ORAL_TABLET | ORAL | Status: DC

## 2013-12-20 NOTE — Progress Notes (Signed)
Patient ID: Melanie Chavez, female   DOB: 11/01/1970, 43 y.o.   MRN: [id number]  Annual exam with CPE   Obesity;  She has had a Wt gain :  17 lbs since last visit   Started a new job working for  [employer] .  Happy to be no longer commuting to [place].  Works  In their office from 8 TO 5 ,  Then works another part time job from 5 to 48.  Daughter mallory is 23, patient's mother watches her after school.  Patient averages 8 hrs sleep , in bed by 10,  Out by 6.   Diet reviewed.  Haphazard, not following any particular regimen,.  Skips meals a lot.  Has reduced sweet tea consumption  . Skips two meals on Sunday ,  Skips breakfast often.  Doesn't have a big appetite but still drinking a lot of sodas and tea.   Insomnia:  Uses ambien every other night.,   Subjective:     Melanie Chavez is a 43 y.o. female and is here for a comprehensive physical exam. The patient reports problems - as follows:[name]  History   Social History  . Marital Status: Single    Spouse Name: N/A    Number of Children: N/A  . Years of Education: N/A   Occupational History  . Not on file.   Social History Main Topics  . Smoking status: Never Smoker   . Smokeless tobacco: Never Used  . Alcohol Use: Not on file  . Drug Use: Not on file  . Sexual Activity: Not on file   Other Topics Concern  . Not on file   Social History Narrative  . No narrative on file   Health Maintenance  Topic Date Due  . Tetanus/tdap  03/16/1991  . Influenza Vaccine  01/08/2014  . Pap Smear  10/21/2014    The following portions of the patient's history were reviewed and updated as appropriate: allergies, current medications, past family history, past medical history, past social history, past surgical history and problem list.  Review of Systems A comprehensive review of systems was negative.   Objective:   BP 148/90  Pulse 99  Temp(Src) 98.2 F (36.8 C) (Oral)  Resp 18  Ht 5\' 5"  (1.651 m)  Wt 267 lb 12 oz  (121.451 kg)  BMI 44.56 kg/m2  SpO2 98%  LMP 12/06/2013  General appearance: alert, cooperative and appears stated age Head: Normocephalic, without obvious abnormality, atraumatic Eyes: conjunctivae/corneas clear. PERRL, EOM's intact. Fundi benign. Ears: normal TM's and external ear canals both ears Nose: Nares normal. Septum midline. Mucosa normal. No drainage or sinus tenderness. Throat: lips, mucosa, and tongue normal; teeth and gums normal Neck: no adenopathy, no carotid bruit, no JVD, supple, symmetrical, trachea midline and thyroid not enlarged, symmetric, no tenderness/mass/nodules Lungs: clear to auscultation bilaterally Breasts: very pendulous breasts,  no masses or tenderness Heart: regular rate and rhythm, S1, S2 normal, no murmur, click, rub or gallop Abdomen: soft, non-tender; bowel sounds normal; no masses,  no organomegaly Extremities: extremities normal, atraumatic, no cyanosis or edema Pulses: 2+ and symmetric Skin: Skin color, texture, turgor normal ecept for hypopigemented areas under both breasts.  Neurologic: Alert and oriented X 3, normal strength and tone. Normal symmetric reflexes. Normal coordination and gait.   Assessment and Plan:   Routine general medical examination at a health care facility Annual comprehensive exam was done including breast, excluding pelvic and PAP smear. She has not been sexually active  for years.  All screenings have been addressed .   Morbid obesity with BMI of 40.0-44.9, adult I have addressed  BMI and recommended wt loss of 10% of body weigh over the next 6 months using a low glycemic index diet and regular exercise a minimum of 5 days per week. Discussed trial of phentermine when she has resumed a diet and exercise regimen.    Snoring Given her morbid obesity, snoring disorder and worsening hypertension,  She was referred to Sleep Center to rule out OSA last November but failed to keep the appt for sleep study.  She declines  rescheduling at this time    Hypertension Elevated today.  Reviewed list of meds, patient is not taking OTC meds that could be causing,. It.  Have asked patient to recheck bp at work of 5 and call reading to office for adjustment of medications.  Given her resting HR of 99 would recommend increasing metoprolol first.     Updated Medication List Outpatient Encounter Prescriptions as of 12/20/2013  Medication Sig  . losartan-hydrochlorothiazide (HYZAAR) 50-12.5 MG per tablet Take 1 tablet by mouth daily.  . metoprolol succinate (TOPROL-XL) 50 MG 24 hr tablet Take 1 tablet (50 mg total) by mouth daily. Take with or immediately following a meal.  . norethindrone-ethinyl estradiol-iron (MICROGESTIN FE 1.5/30) 1.5-30 MG-MCG tablet Take 1 tablet by mouth daily.  [name] zolpidem (AMBIEN) 5 MG tablet TAKE ONE TABLET BY MOUTH AT BEDTIME AS NEEDED FOR SLEEP  . [DISCONTINUED] metoprolol succinate (TOPROL-XL) 50 MG 24 hr tablet Take 1 tablet (50 mg total) by mouth daily. Take with or immediately following a meal.  . [DISCONTINUED] zolpidem (AMBIEN) 5 MG tablet TAKE ONE TABLET BY MOUTH AT BEDTIME AS NEEDED FOR SLEEP  . nystatin ointment (MYCOSTATIN) Apply topically 2 (two) times daily. Until rash resolves  . [DISCONTINUED] losartan (COZAAR) 50 MG tablet Take 1 tablet (50 mg total) by mouth daily.

## 2013-12-20 NOTE — Progress Notes (Signed)
Pre-visit discussion using our clinic review tool. No additional management support is needed unless otherwise documented below in the visit note.  

## 2013-12-20 NOTE — Patient Instructions (Signed)
Please get your BP rechecked at work ASAP and let me know the results  We may have to increase the metoprolol or the losartan dose.  I want you to start dancing or exercising for 30 minutes  Days per week and following a low glycemic index diet  You need to eat 6 smaller meals daily to stimulate your metabolism  This is  MY version of a  "Low GI"  Diet:  It will  allow you to lose 4 to 8  lbs  per month if you follow it carefully.  Your goal with exercise is a minimum of 30 minutes of aerobic exercise 5 days per week (Walking does not count once it becomes easy!)    All of the foods can be found at grocery stores and in bulk at [employer].  The Atkins protein bars and shakes are available in more varieties at Target, [employer] and [place].     7 AM Breakfast:  Choose from the following:  Low carbohydrate Protein  Shakes (I recommend the EAS AdvantEdge "Carb Control" shakes  Or the low carb shakes by Atkins.    2.5 carbs   Arnold's "Sandwhich Thin"toasted  w/ peanut butter (no jelly: about 20 net carbs  "Bagel Thin" with cream cheese and salmon: about 20 carbs   a scrambled egg/bacon/cheese burrito made with Mission's "carb balance" whole wheat tortilla  (about 10 net carbs )   Avoid cereal and bananas, oatmeal and cream of wheat and grits. They are loaded with carbohydrates!   10 AM: high protein snack  Protein bar by Atkins (the snack size, under 200 cal, usually < 6 net carbs).    A stick of cheese:  Around 1 carb,  100 cal     Dannon Light n Fit [place] Yogurt  (80 cal, 8 carbs)  Other so called "protein bars" and Greek yogurts tend to be loaded with carbohydrates.  Remember, in food advertising, the word "energy" is synonymous for " carbohydrate."  Lunch:   A Sandwich using the bread choices listed, Can use any  Eggs,  lunchmeat, grilled meat or canned tuna), avocado, regular mayo/mustard  and cheese.  A Salad using blue cheese, ranch,  Goddess or vinagrette,  No croutons or  "confetti" and no "candied nuts" but regular nuts OK.   No pretzels or chips.  Pickles and miniature sweet peppers are a good low carb alternative that provide a "crunch"  The bread is the only source of carbohydrate in a sandwich and  can be decreased by trying some of these alternatives to traditional loaf bread  Joseph's makes a pita bread and a flat bread that are 50 cal and 4 net carbs available at [place] and [place].  This can be toasted to use with hummous as well  Toufayan makes a low carb flatbread that's 100 cal and 9 net carbs available at [employer] and [employer]J's makes 2 sizes of  Low carb whole wheat tortilla  (The large one is 210 cal and 6 net carbs) Avoid "Low fat dressings, as well as Barry Brunner and Elkins dressings They are loaded with sugar!   3 PM/ Mid day  Snack:  Consider  1 ounce of  almonds, walnuts, pistachios, pecans, peanuts,  Macadamia nuts or a nut medley.  Avoid "granola"; the dried cranberries and raisins are loaded with carbohydrates. Mixed nuts as long as there are no raisins,  cranberries or dried fruit.    Try the prosciutto/mozzarella cheese  sticks by Fiorruci  In deli /backery section   High protein      6 PM  Dinner:     Meat/fowl/fish with a green salad, and either broccoli, cauliflower, green beans, spinach, brussel sprouts or  Lima beans. DO NOT BREAD THE PROTEIN!!      There is a low carb pasta by Dreamfield's that is acceptable and tastes great: only 5 digestible carbs/serving.( All grocery stores but BJs carry it )  Try Hurley Cisco Angelo's chicken piccata or chicken or eggplant parm over low carb pasta.(Lowes and BJs)   Marjory Lies Sanchez's "Carnitas" (pulled pork, no sauce,  0 carbs) or his beef pot roast to make a dinner burrito (at BJ's)  Pesto over low carb pasta (bj's sells a good quality pesto in the center refrigerated section of the deli   Try satueeing  Cheral Marker with mushroooms  Whole wheat pasta is still full of digestible carbs and  Not as  low in glycemic index as Dreamfield's.   Brown rice is still rice,  So skip the rice and noodles if you eat [place] or [place] (or at least limit to 1/2 cup)  9 PM snack :   Breyer's "low carb" fudgsicle or  ice cream bar (Carb Smart line), or  Weight Watcher's ice cream bar , or another "no sugar added" ice cream;  a serving of fresh berries/cherries with whipped cream   Cheese or DANNON'S LlGHT N FIT G[place] YOGURT  8 ounces of [place] unsweetened almond/cococunut milk    Avoid bananas, pineapple, grapes  and watermelon on a regular basis because they are high in sugar.  THINK OF THEM AS DESSERT  Remember that snack Substitutions should be less than 10 NET carbs per serving and meals < 20 carbs. Remember to subtract fiber grams to get the "net carbs."

## 2013-12-21 ENCOUNTER — Telehealth: Payer: Self-pay | Admitting: Internal Medicine

## 2013-12-21 NOTE — Telephone Encounter (Signed)
Relevant patient education mailed to patient.  

## 2013-12-21 NOTE — Assessment & Plan Note (Signed)
Elevated today.  Reviewed list of meds, patient is not taking OTC meds that could be causing,. It.  Have asked patient to recheck bp at work of 5 and call reading to office for adjustment of medications.  Given her resting HR of 99 would recommend increasing metoprolol first.

## 2013-12-21 NOTE — Assessment & Plan Note (Signed)
Annual comprehensive exam was done including breast, excluding pelvic and PAP smear. She has not been sexually active for years.  All screenings have been addressed .

## 2013-12-21 NOTE — Assessment & Plan Note (Signed)
I have addressed  BMI and recommended wt loss of 10% of body weigh over the next 6 months using a low glycemic index diet and regular exercise a minimum of 5 days per week. Discussed trial of phentermine when she has resumed a diet and exercise regimen.

## 2013-12-21 NOTE — Assessment & Plan Note (Addendum)
Given her morbid obesity, snoring disorder and worsening hypertension,  She was referred to Sleep Center to rule out OSA last November but failed to keep the appt for sleep study.  She declines rescheduling at this time

## 2013-12-22 ENCOUNTER — Encounter: Payer: Self-pay | Admitting: Internal Medicine

## 2013-12-26 ENCOUNTER — Telehealth: Payer: Self-pay | Admitting: Internal Medicine

## 2013-12-26 NOTE — Telephone Encounter (Signed)
I received her immunization record from [facility] .  She got her TDap in 2012.  She got the first Hep B vaccine dose October 02 2013,  Needs to finish that schedule this year

## 2013-12-27 NOTE — Telephone Encounter (Signed)
Left message for patient to return call to office. 

## 2013-12-28 NOTE — Telephone Encounter (Signed)
Left patient detailed message per DPR  

## 2014-01-25 ENCOUNTER — Other Ambulatory Visit: Payer: Self-pay | Admitting: Internal Medicine

## 2014-06-14 ENCOUNTER — Other Ambulatory Visit: Payer: Self-pay | Admitting: Internal Medicine

## 2014-06-14 NOTE — Telephone Encounter (Signed)
rx faxed

## 2014-06-14 NOTE — Telephone Encounter (Signed)
Needs office visit in February , refill until then printed

## 2014-06-14 NOTE — Telephone Encounter (Signed)
Last OV 7.31.15.  Please advise refill

## 2014-06-14 NOTE — Telephone Encounter (Signed)
Faxed to pharmacy. Sent mychart on need for appt in February

## 2014-07-20 ENCOUNTER — Ambulatory Visit (INDEPENDENT_AMBULATORY_CARE_PROVIDER_SITE_OTHER): Payer: 59 | Admitting: Internal Medicine

## 2014-07-20 ENCOUNTER — Encounter: Payer: Self-pay | Admitting: Internal Medicine

## 2014-07-20 VITALS — BP 128/78 | HR 85 | Temp 98.6°F | Resp 16 | Ht 65.75 in | Wt 270.2 lb

## 2014-07-20 DIAGNOSIS — E161 Other hypoglycemia: Secondary | ICD-10-CM

## 2014-07-20 DIAGNOSIS — I10 Essential (primary) hypertension: Secondary | ICD-10-CM

## 2014-07-20 DIAGNOSIS — G4700 Insomnia, unspecified: Secondary | ICD-10-CM

## 2014-07-20 DIAGNOSIS — E162 Hypoglycemia, unspecified: Secondary | ICD-10-CM

## 2014-07-20 DIAGNOSIS — Z6841 Body Mass Index (BMI) 40.0 and over, adult: Secondary | ICD-10-CM

## 2014-07-20 DIAGNOSIS — K5900 Constipation, unspecified: Secondary | ICD-10-CM

## 2014-07-20 LAB — COMPREHENSIVE METABOLIC PANEL
ALBUMIN: 4.2 g/dL (ref 3.5–5.2)
ALT: 32 U/L (ref 0–35)
AST: 39 U/L — AB (ref 0–37)
Alkaline Phosphatase: 46 U/L (ref 39–117)
BUN: 13 mg/dL (ref 6–23)
CO2: 26 mEq/L (ref 19–32)
CREATININE: 0.82 mg/dL (ref 0.40–1.20)
Calcium: 9.4 mg/dL (ref 8.4–10.5)
Chloride: 101 mEq/L (ref 96–112)
GFR: 98.16 mL/min (ref >60.00)
GLUCOSE: 106 mg/dL — AB (ref 70–99)
POTASSIUM: 3.5 meq/L (ref 3.5–5.1)
SODIUM: 136 meq/L (ref 135–145)
Total Bilirubin: 0.6 mg/dL (ref 0.2–1.2)
Total Protein: 7.8 g/dL (ref 6.0–8.3)

## 2014-07-20 MED ORDER — NORETHIN ACE-ETH ESTRAD-FE 1.5-30 MG-MCG PO TABS: 1.0000 | ORAL_TABLET | Freq: Every day | ORAL | Status: DC

## 2014-07-20 MED ORDER — METOPROLOL SUCCINATE ER 50 MG PO TB24: 50.0000 mg | ORAL_TABLET | Freq: Every day | ORAL | Status: DC

## 2014-07-20 MED ORDER — LOSARTAN POTASSIUM-HCTZ 50-12.5 MG PO TABS: 1.0000 | ORAL_TABLET | Freq: Every day | ORAL | Status: DC

## 2014-07-20 MED ORDER — ZOLPIDEM TARTRATE 5 MG PO TABS: 5.0000 mg | ORAL_TABLET | Freq: Every evening | ORAL | Status: DC | PRN

## 2014-07-20 NOTE — Patient Instructions (Addendum)
You are having some constipation issues due to change in diet.   Miralax,  Metamucil, citrucel or benefiber or Fibercon are laxatives that can be used daily  Save the sennakot or dulcolax to not more than every 3  Days, only if you have not moved bowels in 3 days  Drink 3 10 ounce servings of water on noncaffeinated beverage daily    Goal is 30 minutes of exercise 5 days per week ( work your way up to this )   Goal wt  Loss 4- 5 lbs/month with our without phentermine

## 2014-07-22 ENCOUNTER — Encounter: Payer: Self-pay | Admitting: Internal Medicine

## 2014-07-22 DIAGNOSIS — G4700 Insomnia, unspecified: Secondary | ICD-10-CM | POA: Insufficient documentation

## 2014-07-22 DIAGNOSIS — K5900 Constipation, unspecified: Secondary | ICD-10-CM | POA: Insufficient documentation

## 2014-07-22 NOTE — Assessment & Plan Note (Signed)
Secondary to carbohydrate rich diet,  Now modified    Lab Results  Component Value Date   HGBA1C 5.5 12/20/2013

## 2014-07-22 NOTE — Assessment & Plan Note (Signed)
managed with ambien prn.

## 2014-07-22 NOTE — Assessment & Plan Note (Signed)
Recommended an increase in fiber intake to 25 to 35 grams daily.  Made several dietary suggestions of high fiber content including Mission low carb whole wheat tortillas which have 26 g fiber/serving, Toufayan flatbread which has around 10 g fiber,   Daily serving of any type of nuts,  and Atkins protein bars which have 8 to 10 g fiber.   Dispelled the popularly held notion that Quaker oats oatmeal, whole grain bread and most commericially made cereals have adequate fiber.  Finally I recommended daily use of Miralax., metamucil, citrucel, benefiber  or fibercon.  

## 2014-07-22 NOTE — Progress Notes (Signed)
Patient ID: Melanie Chavez, female   DOB: [birth year], 44 y.o.   MRN: [id number]  Patient Active Problem List   Diagnosis Date Noted  . Constipation 07/22/2014  . Insomnia 07/22/2014  . Snoring 05/03/2013  . Routine general medical examination at a health care facility 10/17/2011  . H/O pelvic inflammatory disease   . Plantar fasciitis   . Reactive hypoglycemia   . History of recurrent miscarriages, not currently pregnant   . Morbid obesity with BMI of 40.0-44.9, adult 07/17/2011  . Hypertension 07/17/2011    Subjective:  CC:   Chief Complaint  Patient presents with  . Follow-up    medication refills    HPI:   Melanie Chavez is a 44 y.o. female who presents for follow up on hypertension,  Impaired fasting glucose,  Obesity and insomnia.  [name] sHe feels generally well, was walking several times per week until the holidays,  And has made a plan to resume using a gym.  She has eliminated sodas from her diet.  Denies any recent hypoglyemic events.  Taking her medications as directed. Following a carbohydrate modified diet  Most days per week. Denies numbness, burning and tingling of extremities. Appetite is good.      Past Medical History  Diagnosis Date  . Hypertension   . Obesity (BMI 30-39.9)   . H/O pelvic inflammatory disease   . Plantar fasciitis   . Reactive hypoglycemia 2011  . History of recurrent miscarriages, not currently pregnant     Past Surgical History  Procedure Laterality Date  . Cholecystectomy  2007    [place]       The following portions of the patient's history were reviewed and updated as appropriate: Allergies, current medications, and problem list.    Review of Systems:   Patient denies headache, fevers, malaise, unintentional weight loss, skin rash, eye pain, sinus congestion and sinus pain, sore throat, dysphagia,  hemoptysis , cough, dyspnea, wheezing, chest pain, palpitations, orthopnea, edema, abdominal pain, nausea, melena,  diarrhea, constipation, flank pain, dysuria, hematuria, urinary  Frequency, nocturia, numbness, tingling, seizures,  Focal weakness, Loss of consciousness,  Tremor, insomnia, depression, anxiety, and suicidal ideation.     History   Social History  . Marital Status: Single    Spouse Name: N/A  . Number of Children: N/A  . Years of Education: N/A   Occupational History  . Not on file.   Social History Main Topics  . Smoking status: Never Smoker   . Smokeless tobacco: Never Used  . Alcohol Use: Not on file  . Drug Use: Not on file  . Sexual Activity: Not on file   Other Topics Concern  . Not on file   Social History Narrative    Objective:  Filed Vitals:   07/20/14 1329  BP: 128/78  Pulse: 85  Temp: 98.6 F (37 C)  Resp: 16     General appearance: alert, cooperative and appears stated age Ears: normal TM's and external ear canals both ears Throat: lips, mucosa, and tongue normal; teeth and gums normal Neck: no adenopathy, no carotid bruit, supple, symmetrical, trachea midline and thyroid not enlarged, symmetric, no tenderness/mass/nodules Back: symmetric, no curvature. ROM normal. No CVA tenderness. Lungs: clear to auscultation bilaterally Heart: regular rate and rhythm, S1, S2 normal, no murmur, click, rub or gallop Abdomen: soft, non-tender; bowel sounds normal; no masses,  no organomegaly Pulses: 2+ and symmetric Skin: Skin color, texture, turgor normal. No rashes or lesions Lymph nodes: Cervical, supraclavicular, and axillary  nodes normal.  Assessment and Plan:  Constipation Recommended an increase in fiber intake to 25 to 35 grams daily.  Made several dietary suggestions of high fiber content including [place] low carb whole wheat tortillas which have 26 g fiber/serving, [place] flatbread which has around 10 g fiber,   Daily serving of any type of nuts,  and Atkins protein bars which have 8 to 10 g fiber.   Dispelled the popularly held notion that [employer]  oatmeal, whole grain bread and most commericially made cereals have adequate fiber.  Finally I recommended daily use of Miralax., metamucil, citrucel, benefiber  or fibercon.    Morbid obesity with BMI of 40.0-44.9, adult I have addressed  BMI and recommended wt loss of 10% of body weigh over the next 6 months using a low glycemic index diet and regular exercise a minimum of 5 days per week.     Reactive hypoglycemia  Secondary to carbohydrate rich diet,  Now modified    Lab Results  Component Value Date   HGBA1C 5.5 12/20/2013       Insomnia managed with ambien prn.     Updated Medication List Outpatient Encounter Prescriptions as of 07/20/2014  Medication Sig  . losartan-hydrochlorothiazide (HYZAAR) 50-12.5 MG per tablet Take 1 tablet by mouth daily.  . metoprolol succinate (TOPROL-XL) 50 MG 24 hr tablet Take 1 tablet (50 mg total) by mouth daily. Take with or immediately following a meal.  . norethindrone-ethinyl estradiol-iron (LARIN FE 1.5/30) 1.5-30 MG-MCG tablet Take 1 tablet by mouth daily.  [name] nystatin ointment (MYCOSTATIN) Apply topically 2 (two) times daily. Until rash resolves  . zolpidem (AMBIEN) 5 MG tablet Take 1 tablet (5 mg total) by mouth at bedtime as needed. for sleep  . [DISCONTINUED] LARIN FE 1.5/30 1.5-30 MG-MCG tablet Take 1 tablet by mouth daily.  . [DISCONTINUED] losartan-hydrochlorothiazide (HYZAAR) 50-12.5 MG per tablet Take 1 tablet by mouth daily.  . [DISCONTINUED] metoprolol succinate (TOPROL-XL) 50 MG 24 hr tablet Take 1 tablet (50 mg total) by mouth daily. Take with or immediately following a meal.  . [DISCONTINUED] zolpidem (AMBIEN) 5 MG tablet TAKE ONE TABLET BY MOUTH AT BEDTIME AS NEEDED FOR SLEEP     Orders Placed This Encounter  Procedures  . Comp Met (CMET)    No Follow-up on file.

## 2014-07-22 NOTE — Assessment & Plan Note (Signed)
I have addressed  BMI and recommended wt loss of 10% of body weigh over the next 6 months using a low glycemic index diet and regular exercise a minimum of 5 days per week.   

## 2014-09-01 NOTE — Telephone Encounter (Signed)
Mailed unread message to pt  

## 2014-10-12 ENCOUNTER — Other Ambulatory Visit: Payer: Self-pay | Admitting: Internal Medicine

## 2014-10-12 NOTE — Telephone Encounter (Signed)
Last visit 07/20/14, ok refill?

## 2014-10-13 NOTE — Telephone Encounter (Signed)
Ok to refill,  Authorized in epic can phone in

## 2014-10-17 NOTE — Telephone Encounter (Signed)
Rx phoned into pharmacy.

## 2014-12-28 ENCOUNTER — Encounter: Payer: Self-pay | Admitting: Internal Medicine

## 2014-12-28 ENCOUNTER — Other Ambulatory Visit (HOSPITAL_COMMUNITY)
Admission: RE | Admit: 2014-12-28 | Discharge: 2014-12-28 | Disposition: A | Discharge status: Home / Self Care | Payer: 59 | Source: Ambulatory Visit | Attending: Internal Medicine | Admitting: Internal Medicine

## 2014-12-28 ENCOUNTER — Ambulatory Visit (INDEPENDENT_AMBULATORY_CARE_PROVIDER_SITE_OTHER): Payer: 59 | Admitting: Internal Medicine

## 2014-12-28 VITALS — BP 128/88 | HR 70 | Temp 98.4°F | Resp 14 | Ht 66.0 in | Wt 266.0 lb

## 2014-12-28 DIAGNOSIS — Z6841 Body Mass Index (BMI) 40.0 and over, adult: Secondary | ICD-10-CM

## 2014-12-28 DIAGNOSIS — Z1151 Encounter for screening for human papillomavirus (HPV): Secondary | ICD-10-CM | POA: Diagnosis present

## 2014-12-28 DIAGNOSIS — G4700 Insomnia, unspecified: Secondary | ICD-10-CM

## 2014-12-28 DIAGNOSIS — Z01419 Encounter for gynecological examination (general) (routine) without abnormal findings: Secondary | ICD-10-CM | POA: Diagnosis present

## 2014-12-28 DIAGNOSIS — E161 Other hypoglycemia: Secondary | ICD-10-CM

## 2014-12-28 DIAGNOSIS — I10 Essential (primary) hypertension: Secondary | ICD-10-CM

## 2014-12-28 DIAGNOSIS — Z1159 Encounter for screening for other viral diseases: Secondary | ICD-10-CM

## 2014-12-28 DIAGNOSIS — E162 Hypoglycemia, unspecified: Secondary | ICD-10-CM

## 2014-12-28 DIAGNOSIS — Z124 Encounter for screening for malignant neoplasm of cervix: Secondary | ICD-10-CM

## 2014-12-28 DIAGNOSIS — Z0000 Encounter for general adult medical examination without abnormal findings: Secondary | ICD-10-CM | POA: Diagnosis not present

## 2014-12-28 DIAGNOSIS — Z1239 Encounter for other screening for malignant neoplasm of breast: Secondary | ICD-10-CM

## 2014-12-28 DIAGNOSIS — E786 Lipoprotein deficiency: Secondary | ICD-10-CM | POA: Diagnosis not present

## 2014-12-28 LAB — COMPREHENSIVE METABOLIC PANEL
ALBUMIN: 4.1 g/dL (ref 3.5–5.2)
ALT: 29 U/L (ref 0–35)
AST: 28 U/L (ref 0–37)
Alkaline Phosphatase: 38 U/L — ABNORMAL LOW (ref 39–117)
BUN: 12 mg/dL (ref 6–23)
CO2: 27 meq/L (ref 19–32)
Calcium: 9.2 mg/dL (ref 8.4–10.5)
Chloride: 105 mEq/L (ref 96–112)
Creatinine, Ser: 0.77 mg/dL (ref 0.40–1.20)
GFR: 105.33 mL/min (ref >60.00)
Glucose, Bld: 83 mg/dL (ref 70–99)
Potassium: 4 mEq/L (ref 3.5–5.1)
SODIUM: 139 meq/L (ref 135–145)
Total Bilirubin: 0.7 mg/dL (ref 0.2–1.2)
Total Protein: 7.3 g/dL (ref 6.0–8.3)

## 2014-12-28 LAB — HEMOGLOBIN A1C: HEMOGLOBIN A1C: 5.3 % (ref 4.6–6.5)

## 2014-12-28 LAB — LIPID PANEL
CHOL/HDL RATIO: 3
Cholesterol: 109 mg/dL (ref 0–200)
HDL: 42.1 mg/dL (ref >39.00)
LDL CALC: 56 mg/dL (ref 0–99)
NonHDL: 66.9
Triglycerides: 55 mg/dL (ref 0.0–149.0)
VLDL: 11 mg/dL (ref 0.0–40.0)

## 2014-12-28 LAB — TSH: TSH: 1.33 u[IU]/mL (ref 0.35–4.50)

## 2014-12-28 NOTE — Progress Notes (Signed)
Pre-visit discussion using our clinic review tool. No additional management support is needed unless otherwise documented below in the visit note.  

## 2014-12-28 NOTE — Patient Instructions (Signed)
The  diet I discussed with you today is the 10 day Green Smoothie Cleansing /Detox Diet by Linden Dolin . available on [place] for around $10.  This is not a low carb or a weight loss diet,  It is fundamentally a "cleansing" low fat diet that eliminates sugar, gluten, caffeine, alcohol and dairy for 10 days .  What you add back after the initial ten days is entirely up to  you!  You can expect to lose 5 to 10 lbs depending on how strict you are.   I suggest drinking 2 smoothies daily and keeping one chewable meal (but keep it simple, like baked fish and salad, rice or bok choy) .  You snack primarily on fresh  fruit, egg whites and judicious quantities of nuts. You can add vegetable based protein powder (nothing with whey , since whey is dairy) in it.  [employer] has a [employer], political party .   It does require some form of a nutrient extractor (Vita Mix, a electric juicer,  Or a Nutribullet Rx) but you can try using your blender if you use frozen fruits   i have found that using frozen fruits is much more convenient and cost effective. You can even find plenty of organic fruit in the frozen fruit section of [employer].  Just thaw what you need for the following day the night before in the refrigerator (to avoid jamming up your machine)   Health Maintenance Adopting a healthy lifestyle and getting preventive care can go a long way to promote health and wellness. Talk with your health care provider about what schedule of regular examinations is right for you. This is a good chance for you to check in with your provider about disease prevention and staying healthy. In between checkups, there are plenty of things you can do on your own. Experts have done a lot of research about which lifestyle changes and preventive measures are most likely to keep you healthy. Ask your health care provider for more information. WEIGHT AND DIET  Eat a healthy diet  Be sure to include plenty of vegetables, fruits, low-fat dairy products,  and lean protein.  Do not eat a lot of foods high in solid fats, added sugars, or salt.  Get regular exercise. This is one of the most important things you can do for your health.  Most adults should exercise for at least 150 minutes each week. The exercise should increase your heart rate and make you sweat (moderate-intensity exercise).  Most adults should also do strengthening exercises at least twice a week. This is in addition to the moderate-intensity exercise.  Maintain a healthy weight  Body mass index (BMI) is a measurement that can be used to identify possible weight problems. It estimates body fat based on height and weight. Your health care provider can help determine your BMI and help you achieve or maintain a healthy weight.  For females 44 years of age and older:   A BMI below 18.5 is considered underweight.  A BMI of 18.5 to 24.9 is normal.  A BMI of 25 to 29.9 is considered overweight.  A BMI of 30 and above is considered obese.  Watch levels of cholesterol and blood lipids  You should start having your blood tested for lipids and cholesterol at 43 years of age, then have this test every 5 years.  You may need to have your cholesterol levels checked more often if:  Your lipid or cholesterol levels are high.  You are  older than 43 years of age.  You are at high risk for heart disease.  CANCER SCREENING   Lung Cancer  Lung cancer screening is recommended for adults 23-33 years old who are at high risk for lung cancer because of a history of smoking.  A yearly low-dose CT scan of the lungs is recommended for people who:  Currently smoke.  Have quit within the past 15 years.  Have at least a 30-pack-year history of smoking. A pack year is smoking an average of one pack of cigarettes a day for 1 year.  Yearly screening should continue until it has been 15 years since you quit.  Yearly screening should stop if you develop a health problem that would  prevent you from having lung cancer treatment.  Breast Cancer  Practice breast self-awareness. This means understanding how your breasts normally appear and feel.  It also means doing regular breast self-exams. Let your health care provider know about any changes, no matter how small.  If you are in your 20s or 30s, you should have a clinical breast exam (CBE) by a health care provider every 1-3 years as part of a regular health exam.  If you are 21 or older, have a CBE every year. Also consider having a breast X-ray (mammogram) every year.  If you have a family history of breast cancer, talk to your health care provider about genetic screening.  If you are at high risk for breast cancer, talk to your health care provider about having an MRI and a mammogram every year.  Breast cancer gene (BRCA) assessment is recommended for women who have family members with BRCA-related cancers. BRCA-related cancers include:  Breast.  Ovarian.  Tubal.  Peritoneal cancers.  Results of the assessment will determine the need for genetic counseling and BRCA1 and BRCA2 testing. Cervical Cancer Routine pelvic examinations to screen for cervical cancer are no longer recommended for nonpregnant women who are considered low risk for cancer of the pelvic organs (ovaries, uterus, and vagina) and who do not have symptoms. A pelvic examination may be necessary if you have symptoms including those associated with pelvic infections. Ask your health care provider if a screening pelvic exam is right for you.   The Pap test is the screening test for cervical cancer for women who are considered at risk.  If you had a hysterectomy for a problem that was not cancer or a condition that could lead to cancer, then you no longer need Pap tests.  If you are older than 65 years, and you have had normal Pap tests for the past 10 years, you no longer need to have Pap tests.  If you have had past treatment for cervical  cancer or a condition that could lead to cancer, you need Pap tests and screening for cancer for at least 20 years after your treatment.  If you no longer get a Pap test, assess your risk factors if they change (such as having a new sexual partner). This can affect whether you should start being screened again.  Some women have medical problems that increase their chance of getting cervical cancer. If this is the case for you, your health care provider may recommend more frequent screening and Pap tests.  The human papillomavirus (HPV) test is another test that may be used for cervical cancer screening. The HPV test looks for the virus that can cause cell changes in the cervix. The cells collected during the Pap test can be tested  for HPV.  The HPV test can be used to screen women 82 years of age and older. Getting tested for HPV can extend the interval between normal Pap tests from three to five years.  An HPV test also should be used to screen women of any age who have unclear Pap test results.  After 43 years of age, women should have HPV testing as often as Pap tests.  Colorectal Cancer  This type of cancer can be detected and often prevented.  Routine colorectal cancer screening usually begins at 43 years of age and continues through 43 years of age.  Your health care provider may recommend screening at an earlier age if you have risk factors for colon cancer.  Your health care provider may also recommend using home test kits to check for hidden blood in the stool.  A small camera at the end of a tube can be used to examine your colon directly (sigmoidoscopy or colonoscopy). This is done to check for the earliest forms of colorectal cancer.  Routine screening usually begins at age 15.  Direct examination of the colon should be repeated every 5-10 years through 43 years of age. However, you may need to be screened more often if early forms of precancerous polyps or small growths are  found. Skin Cancer  Check your skin from head to toe regularly.  Tell your health care provider about any new moles or changes in moles, especially if there is a change in a mole's shape or color.  Also tell your health care provider if you have a mole that is larger than the size of a pencil eraser.  Always use sunscreen. Apply sunscreen liberally and repeatedly throughout the day.  Protect yourself by wearing long sleeves, pants, a wide-brimmed hat, and sunglasses whenever you are outside. HEART DISEASE, DIABETES, AND HIGH BLOOD PRESSURE   Have your blood pressure checked at least every 1-2 years. High blood pressure causes heart disease and increases the risk of stroke.  If you are between 6 years and 29 years old, ask your health care provider if you should take aspirin to prevent strokes.  Have regular diabetes screenings. This involves taking a blood sample to check your fasting blood sugar level.  If you are at a normal weight and have a low risk for diabetes, have this test once every three years after 43 years of age.  If you are overweight and have a high risk for diabetes, consider being tested at a younger age or more often. PREVENTING INFECTION  Hepatitis B  If you have a higher risk for hepatitis B, you should be screened for this virus. You are considered at high risk for hepatitis B if:  You were born in a country where hepatitis B is common. Ask your health care provider which countries are considered high risk.  Your parents were born in a high-risk country, and you have not been immunized against hepatitis B (hepatitis B vaccine).  You have HIV or AIDS.  You use needles to inject street drugs.  You live with someone who has hepatitis B.  You have had sex with someone who has hepatitis B.  You get hemodialysis treatment.  You take certain medicines for conditions, including cancer, organ transplantation, and autoimmune conditions. Hepatitis C  Blood  testing is recommended for:  Everyone born from [birth year] through 1964.  Anyone with known risk factors for hepatitis C. Sexually transmitted infections (STIs)  You should be screened for sexually transmitted infections (  STIs) including gonorrhea and chlamydia if:  You are sexually active and are younger than 43 years of age.  You are older than 43 years of age and your health care provider tells you that you are at risk for this type of infection.  Your sexual activity has changed since you were last screened and you are at an increased risk for chlamydia or gonorrhea. Ask your health care provider if you are at risk.  If you do not have HIV, but are at risk, it may be recommended that you take a prescription medicine daily to prevent HIV infection. This is called pre-exposure prophylaxis (PrEP). You are considered at risk if:  You are sexually active and do not regularly use condoms or know the HIV status of your partner(s).  You take drugs by injection.  You are sexually active with a partner who has HIV. Talk with your health care provider about whether you are at high risk of being infected with HIV. If you choose to begin PrEP, you should first be tested for HIV. You should then be tested every 3 months for as long as you are taking PrEP.  PREGNANCY   If you are premenopausal and you may become pregnant, ask your health care provider about preconception counseling.  If you may become pregnant, take 400 to 800 micrograms (mcg) of folic acid every day.  If you want to prevent pregnancy, talk to your health care provider about birth control (contraception). OSTEOPOROSIS AND MENOPAUSE   Osteoporosis is a disease in which the bones lose minerals and strength with aging. This can result in serious bone fractures. Your risk for osteoporosis can be identified using a bone density scan.  If you are 49 years of age or older, or if you are at risk for osteoporosis and fractures, ask your  health care provider if you should be screened.  Ask your health care provider whether you should take a calcium or vitamin D supplement to lower your risk for osteoporosis.  Menopause may have certain physical symptoms and risks.  Hormone replacement therapy may reduce some of these symptoms and risks. Talk to your health care provider about whether hormone replacement therapy is right for you.  HOME CARE INSTRUCTIONS   Schedule regular health, dental, and eye exams.  Stay current with your immunizations.   Do not use any tobacco products including cigarettes, chewing tobacco, or electronic cigarettes.  If you are pregnant, do not drink alcohol.  If you are breastfeeding, limit how much and how often you drink alcohol.  Limit alcohol intake to no more than 1 drink per day for nonpregnant women. One drink equals 12 ounces of beer, 5 ounces of wine, or 1 ounces of hard liquor.  Do not use street drugs.  Do not share needles.  Ask your health care provider for help if you need support or information about quitting drugs.  Tell your health care provider if you often feel depressed.  Tell your health care provider if you have ever been abused or do not feel safe at home. Document Released: 12/10/2010 Document Revised: 10/11/2013 Document Reviewed: 04/28/2013 [facility] Patient Information 2015 [place], [place]. This information is not intended to replace advice given to you by your health care provider. Make sure you discuss any questions you have with your health care provider.

## 2014-12-28 NOTE — Progress Notes (Signed)
Patient ID: Melanie Chavez, female    DOB: [birth year]  Age: 44 y.o. MRN: [id number]  The patient is here for annual  wellness examination and management of other chronic and acute problems.   The risk factors are reflected in the social history.  The roster of all physicians providing medical care to patient - is listed in the Snapshot section of the chart.  Home safety : The patient has smoke detectors in the home. They wear seatbelts.  There are no firearms at home. There is no violence in the home.   There is no risks for hepatitis, STDs or HIV. There is no   history of blood transfusion. They have no travel history to infectious disease endemic areas of the world.  The patient has seen their dentist in the last six month. They have not  seen their eye doctor in the last year.  She does  not  have excessive sun exposure. Discussed the need for sun protection: hats, long sleeves and use of sunscreen if there is significant sun exposure.   Diet: the importance of a healthy diet is discussed. She does  have a healthy diet.  The benefits of regular aerobic exercise were discussed. She walks 4 times per week ,  20 minutes.   Depression screen: there are no signs or vegative symptoms of depression- irritability, change in appetite, anhedonia, sadness/tearfullness. The following portions of the patient's history were reviewed and updated as appropriate: allergies, current medications, past family history, past medical history,  past surgical history, past social history  and problem list.  Visual acuity was not assessed per patient preference since she has regular follow up with her ophthalmologist. Hearing and body mass index were assessed and reviewed.   During the course of the visit the patient was educated and counseled about appropriate screening and preventive services including : fall prevention , diabetes screening, nutrition counseling, colorectal cancer screening, and recommended  immunizations.    CC: The primary encounter diagnosis was Essential hypertension. Diagnoses of Morbid obesity with BMI of 40.0-44.9, adult, Reactive hypoglycemia, Screening for breast cancer, Need for hepatitis C screening test, Low HDL (under 40), Screening for cervical cancer, Visit for preventive health examination, and Insomnia were also pertinent to this visit.  History Melanie Chavez has a past medical history of Hypertension; Obesity (BMI 30-39.9); H/O pelvic inflammatory disease; Plantar fasciitis; Reactive hypoglycemia (2011); and History of recurrent miscarriages, not currently pregnant.   She has past surgical history that includes Cholecystectomy (2007).   Her family history includes Birth defects in her paternal uncle; Cancer in her maternal grandmother; Diabetes in her mother; Heart disease in her father, paternal aunt, paternal grandfather, and paternal uncle.She reports that she has never smoked. She has never used smokeless tobacco. Her alcohol and drug histories are not on file.  Outpatient Prescriptions Prior to Visit  Medication Sig Dispense Refill  . losartan-hydrochlorothiazide (HYZAAR) 50-12.5 MG per tablet Take 1 tablet by mouth daily. 90 tablet 3  . metoprolol succinate (TOPROL-XL) 50 MG 24 hr tablet Take 1 tablet (50 mg total) by mouth daily. Take with or immediately following a meal. 90 tablet 3  . norethindrone-ethinyl estradiol-iron (LARIN FE 1.5/30) 1.5-30 MG-MCG tablet Take 1 tablet by mouth daily. 84 tablet 3  . nystatin ointment (MYCOSTATIN) Apply topically 2 (two) times daily. Until rash resolves 30 g 1  . zolpidem (AMBIEN) 5 MG tablet TAKE 1 TABLET BY MOUTH NIGHTLY AT BEDTIME AS NEEDED FOR SLEEP 30 tablet 2   No  facility-administered medications prior to visit.    Review of Systems   Patient denies headache, fevers, malaise, unintentional weight loss, skin rash, eye pain, sinus congestion and sinus pain, sore throat, dysphagia,  hemoptysis , cough, dyspnea,  wheezing, chest pain, palpitations, orthopnea, edema, abdominal pain, nausea, melena, diarrhea, constipation, flank pain, dysuria, hematuria, urinary  Frequency, nocturia, numbness, tingling, seizures,  Focal weakness, Loss of consciousness,  Tremor, insomnia, depression, anxiety, and suicidal ideation.     Objective:  BP 128/88 mmHg  Pulse 70  Temp(Src) 98.4 F (36.9 C) (Oral)  Resp 14  Ht 5\' 6"  (1.676 m)  Wt 266 lb (120.657 kg)  BMI 42.95 kg/m2  SpO2 98%  LMP 12/07/2014 (Approximate)  Physical Exam    General Appearance:    Alert, cooperative, no distress, appears stated age  Head:    Normocephalic, without obvious abnormality, atraumatic  Eyes:    PERRL, conjunctiva/corneas clear, EOM's intact, fundi    benign, both eyes  Ears:    Normal TM's and external ear canals, both ears  Nose:   Nares normal, septum midline, mucosa normal, no drainage    or sinus tenderness  Throat:   Lips, mucosa, and tongue normal; teeth and gums normal  Neck:   Supple, symmetrical, trachea midline, no adenopathy;    thyroid:  no enlargement/tenderness/nodules; no carotid   bruit or JVD  Back:     Symmetric, no curvature, ROM normal, no CVA tenderness  Lungs:     Clear to auscultation bilaterally, respirations unlabored  Chest Wall:    No tenderness or deformity   Heart:    Regular rate and rhythm, S1 and S2 normal, no murmur, rub   or gallop  Breast Exam:    Pendulous, No tenderness, masses, or nipple abnormality  Abdomen:     Soft, non-tender, bowel sounds active all four quadrants,    no masses, no organomegaly  Genitalia:    Pelvic: cervix normal in appearance, external genitalia normal, no adnexal masses or tenderness, no cervical motion tenderness, rectovaginal septum normal, uterus normal size, shape, and consistency and vagina normal without discharge  Extremities:   Extremities normal, atraumatic, no cyanosis or edema  Pulses:   2+ and symmetric all extremities  Skin:   Skin color,  texture, turgor normal, no rashes or lesions  Lymph nodes:   Cervical, supraclavicular, and axillary nodes normal  Neurologic:   CNII-XII intact, normal strength, sensation and reflexes    throughout        Assessment & Plan:   Problem List Items Addressed This Visit      Unprioritized   Morbid obesity with BMI of 40.0-44.9, adult    I have addressed  BMI and recommended wt loss of 10% of body weigh over the next 6 months using a low glycemic index diet and regular exercise a minimum of 5 days per week.        Relevant Orders   TSH (Completed)   Hypertension - Primary    Well controlled on current regimen. Renal function stable, no changes today..  Recheck in 6 months.   Lab Results  Component Value Date   CREATININE 0.77 12/28/2014   Lab Results  Component Value Date   NA 139 12/28/2014   K 4.0 12/28/2014   CL 105 12/28/2014   CO2 27 12/28/2014         Relevant Orders   Comprehensive metabolic panel (Completed)   Reactive hypoglycemia     Secondary to carbohydrate rich diet,  Now modified   .  She has no signs of DM by A1c  Lab Results  Component Value Date   HGBA1C 5.3 12/28/2014            Relevant Orders   Hemoglobin A1c (Completed)   Visit for preventive health examination    Annual wellness  exam was done as well as a comprehensive physical exam and management of acute and chronic conditions .  During the course of the visit the patient was educated and counseled about appropriate screening and preventive services including :  diabetes screening, lipid analysis with projected  10 year  risk for CAD , nutrition counseling, colorectal cancer screening, and recommended immunizations.  Printed recommendations for health maintenance screenings was given.       Insomnia    managed with ambien prn. The risks and benefits of hypnotics were reviewed with patient today including excessive sedation leading to respiratory depression,  impaired thinking/driving,  and addiction.  Patient was advised to avoid concurrent use with alcohol, to use medication only as needed and not to share with others  .          Other Visit Diagnoses    Screening for breast cancer        Relevant Orders    MM DIGITAL SCREENING BILATERAL    Need for hepatitis C screening test        Relevant Orders    Hepatitis C antibody (Completed)    Low HDL (under 40)        Relevant Orders    Lipid panel (Completed)    Screening for cervical cancer        Relevant Orders    Cytology - PAP (Completed)       I am having Ms. Shingledecker maintain her nystatin ointment, norethindrone-ethinyl estradiol-iron, losartan-hydrochlorothiazide, metoprolol succinate, and zolpidem.  No orders of the defined types were placed in this encounter.    There are no discontinued medications.  Follow-up: Return in about 6 months (around 06/30/2015).   [name], MD

## 2014-12-29 LAB — HEPATITIS C ANTIBODY: HCV AB: NEGATIVE

## 2014-12-29 LAB — CYTOLOGY - PAP

## 2014-12-30 ENCOUNTER — Encounter: Payer: Self-pay | Admitting: Internal Medicine

## 2014-12-31 NOTE — Assessment & Plan Note (Signed)
I have addressed  BMI and recommended wt loss of 10% of body weigh over the next 6 months using a low glycemic index diet and regular exercise a minimum of 5 days per week.   

## 2014-12-31 NOTE — Assessment & Plan Note (Signed)
Secondary to carbohydrate rich diet,  Now modified   .  She has no signs of DM by A1c  Lab Results  Component Value Date   HGBA1C 5.3 12/28/2014

## 2014-12-31 NOTE — Addendum Note (Signed)
Addended by: [name] on: 12/31/2014 03:54 PM   Modules accepted: Miquel Dunn

## 2014-12-31 NOTE — Assessment & Plan Note (Signed)
Well controlled on current regimen. Renal function stable, no changes today..  Recheck in 6 months.   Lab Results  Component Value Date   CREATININE 0.77 12/28/2014   Lab Results  Component Value Date   NA 139 12/28/2014   K 4.0 12/28/2014   CL 105 12/28/2014   CO2 27 12/28/2014

## 2014-12-31 NOTE — Assessment & Plan Note (Signed)

## 2014-12-31 NOTE — Assessment & Plan Note (Signed)
managed with ambien prn. The risks and benefits of hypnotics were reviewed with patient today including excessive sedation leading to respiratory depression,  impaired thinking/driving, and addiction.  Patient was advised to avoid concurrent use with alcohol, to use medication only as needed and not to share with others  .      

## 2015-01-17 ENCOUNTER — Other Ambulatory Visit: Payer: Self-pay | Admitting: Internal Medicine

## 2015-01-17 NOTE — Telephone Encounter (Signed)
Last OV 7.20.16.  Please advise refill

## 2015-01-19 NOTE — Telephone Encounter (Signed)
Ok to refill,  Authorized in epic 

## 2015-01-23 NOTE — Telephone Encounter (Signed)
Rx faxed by Kathy 

## 2015-03-20 ENCOUNTER — Ambulatory Visit: Payer: Self-pay | Admitting: Physician Assistant

## 2015-03-20 ENCOUNTER — Encounter: Payer: Self-pay | Admitting: Physician Assistant

## 2015-03-20 VITALS — BP 134/100 | Temp 98.3°F

## 2015-03-20 DIAGNOSIS — H01006 Unspecified blepharitis left eye, unspecified eyelid: Secondary | ICD-10-CM

## 2015-03-20 MED ORDER — SULFACETAMIDE SODIUM 10 % OP SOLN: 1.0000 [drp] | OPHTHALMIC | Status: DC

## 2015-03-20 NOTE — Patient Instructions (Signed)
Blepharitis Blepharitis is inflammation of the eyelids. Blepharitis may happen with:  Reddish, scaly skin around the scalp and eyebrows.  Burning or itching of the eyelids.  Eye discharge at night that causes the eyelashes to stick together in the morning.  Eyelashes that fall out.  Sensitivity to light. HOME CARE INSTRUCTIONS Pay attention to any changes in how you look or feel. Follow these instructions to help with your condition: Keeping Clean  Wash your hands often.  Wash your eyelids with warm water or with warm water that is mixed with a small amount of baby shampoo. Do this two times per day or as often as needed.  Wash your face and eyebrows at least once a day.  Use a clean towel each time you dry your eyelids. Do not use this towel to clean or dry other areas of your body. Do not share your towel with anyone. General Instructions  Avoid wearing makeup until you get better. Do not share makeup with anyone.  Avoid rubbing your eyes.  Apply warm compresses to your eyes 2 times per day for 10 minutes at a time, or as told by your health care provider.  If you were prescribed an antibiotic ointment or steroid drops, apply or use the medicine as told by your health care provider. Do not stop using the medicine even if you feel better.  Keep all follow-up visits as told by your health care provider. This is important. SEEK MEDICAL CARE IF:  Your eyelids feel hot.  You have blisters or a rash on your eyelids.  The condition does not go away in 2-4 days.  The inflammation gets worse. SEEK IMMEDIATE MEDICAL CARE IF:  You have pain or redness that gets worse or spreads to other parts of your face.  Your vision changes.  You have pain when looking at lights or moving objects.  You have a fever.   This information is not intended to replace advice given to you by your health care provider. Make sure you discuss any questions you have with your health care  provider.   Document Released: 05/24/2000 Document Revised: 02/15/2015 Document Reviewed: 09/19/2014 Elsevier Interactive Patient Education 2016 [employer] Blepharitis Blepharitis is inflammation of the eyelids. Blepharitis may happen with:  Reddish, scaly skin around the scalp and eyebrows.  Burning or itching of the eyelids.  Eye discharge at night that causes the eyelashes to stick together in the morning.  Eyelashes that fall out.  Sensitivity to light. HOME CARE INSTRUCTIONS Pay attention to any changes in how you look or feel. Follow these instructions to help with your condition: Keeping Clean  Wash your hands often.  Wash your eyelids with warm water or with warm water that is mixed with a small amount of baby shampoo. Do this two times per day or as often as needed.  Wash your face and eyebrows at least once a day.  Use a clean towel each time you dry your eyelids. Do not use this towel to clean or dry other areas of your body. Do not share your towel with anyone. General Instructions  Avoid wearing makeup until you get better. Do not share makeup with anyone.  Avoid rubbing your eyes.  Apply warm compresses to your eyes 2 times per day for 10 minutes at a time, or as told by your health care provider.  If you were prescribed an antibiotic ointment or steroid drops, apply or use the medicine as told by your health care provider.  Do not stop using the medicine even if you feel better.  Keep all follow-up visits as told by your health care provider. This is important. SEEK MEDICAL CARE IF:  Your eyelids feel hot.  You have blisters or a rash on your eyelids.  The condition does not go away in 2-4 days.  The inflammation gets worse. SEEK IMMEDIATE MEDICAL CARE IF:  You have pain or redness that gets worse or spreads to other parts of your face.  Your vision changes.  You have pain when looking at lights or moving objects.  You have a fever.   This  information is not intended to replace advice given to you by your health care provider. Make sure you discuss any questions you have with your health care provider.   Document Released: 05/24/2000 Document Revised: 02/15/2015 Document Reviewed: 09/19/2014 Elsevier Interactive Patient Education 2016 Else[employer] Inc. Blepharitis Blepharitis is inflammation of the eyelids. Blepharitis may happen with:  Reddish, scaly skin around the scalp and eyebrows.  Burning or itching of the eyelids.  Eye discharge at night that causes the eyelashes to stick together in the morning.  Eyelashes that fall out.  Sensitivity to light. HOME CARE INSTRUCTIONS Pay attention to any changes in how you look or feel. Follow these instructions to help with your condition: Keeping Clean  Wash your hands often.  Wash your eyelids with warm water or with warm water that is mixed with a small amount of baby shampoo. Do this two times per day or as often as needed.  Wash your face and eyebrows at least once a day.  Use a clean towel each time you dry your eyelids. Do not use this towel to clean or dry other areas of your body. Do not share your towel with anyone. General Instructions  Avoid wearing makeup until you get better. Do not share makeup with anyone.  Avoid rubbing your eyes.  Apply warm compresses to your eyes 2 times per day for 10 minutes at a time, or as told by your health care provider.  If you were prescribed an antibiotic ointment or steroid drops, apply or use the medicine as told by your health care provider. Do not stop using the medicine even if you feel better.  Keep all follow-up visits as told by your health care provider. This is important. SEEK MEDICAL CARE IF:  Your eyelids feel hot.  You have blisters or a rash on your eyelids.  The condition does not go away in 2-4 days.  The inflammation gets worse. SEEK IMMEDIATE MEDICAL CARE IF:  You have pain or redness that gets worse  or spreads to other parts of your face.  Your vision changes.  You have pain when looking at lights or moving objects.  You have a fever.   This information is not intended to replace advice given to you by your health care provider. Make sure you discuss any questions you have with your health care provider.   Document Released: 05/24/2000 Document Revised: 02/15/2015 Document Reviewed: 09/19/2014 Elsevier Interactive Patient Education [employer].

## 2015-03-20 NOTE — Progress Notes (Signed)
S/ left upper eyelid swollen, tender and itching, was seen for viral illness last week , denies fever , Loss of vision, or drainage  O/ alert pleasant NAD,  Eyes Perrla , eoms full , left upper lid everted and with localised redness centrally with assoc tenderness and swelling  A/ Blepharitis  P/ sodium sulamyd 10 % one gtt to left eye qid x 5 days .Warm compresses. She has a zpack on hand from viral illness. If sxs not improving with topical she will start zpack  F/u prn any concerns or not improving.

## 2015-06-19 ENCOUNTER — Other Ambulatory Visit: Payer: Self-pay | Admitting: Internal Medicine

## 2015-07-10 ENCOUNTER — Encounter: Payer: Self-pay | Admitting: Physician Assistant

## 2015-07-10 ENCOUNTER — Ambulatory Visit: Payer: Self-pay | Admitting: Physician Assistant

## 2015-07-10 VITALS — BP 140/92 | HR 60 | Temp 98.8°F

## 2015-07-10 DIAGNOSIS — H6502 Acute serous otitis media, left ear: Secondary | ICD-10-CM

## 2015-07-10 MED ORDER — AMOXICILLIN 875 MG PO TABS: 875.0000 mg | ORAL_TABLET | Freq: Two times a day (BID) | ORAL | Status: DC

## 2015-07-10 MED ORDER — FLUCONAZOLE 150 MG PO TABS
150.0000 mg | ORAL_TABLET | Freq: Once | ORAL | Status: DC
Start: 2015-07-10 — End: 2015-08-07

## 2015-07-10 MED ORDER — PREDNISONE 10 MG PO TABS: 30.0000 mg | ORAL_TABLET | Freq: Every day | ORAL | Status: DC

## 2015-07-10 NOTE — Progress Notes (Signed)
S: c/o left ear pain, last week node on left side of neck was swollen, denies fever/chills/drainage/decreased hearing, sx for 3d  O: vitals wnl, nad, left tm dull and pink with serous fluid, nasal mucosa red and swollen, throat wnl, neck supple no lymph noted, lungs c t a, cv rrr  A: aom  P: amoxil , diflucan, pred 30mg  qd x 3d

## 2015-07-17 ENCOUNTER — Other Ambulatory Visit: Payer: Self-pay | Admitting: Internal Medicine

## 2015-07-17 NOTE — Telephone Encounter (Signed)
Last OV 7/16 please advise to refill.

## 2015-07-17 NOTE — Telephone Encounter (Signed)
Refill for 30 days only.  OFFICE VISIT NEEDED prior to any more refills 

## 2015-07-17 NOTE — Telephone Encounter (Signed)
Refill faxed patient aware of need for Appointment.

## 2015-08-07 ENCOUNTER — Ambulatory Visit (INDEPENDENT_AMBULATORY_CARE_PROVIDER_SITE_OTHER): Payer: 59 | Admitting: Internal Medicine

## 2015-08-07 ENCOUNTER — Encounter: Payer: Self-pay | Admitting: Internal Medicine

## 2015-08-07 VITALS — BP 140/88 | HR 68 | Temp 97.9°F | Resp 12 | Ht 66.0 in | Wt 274.5 lb

## 2015-08-07 DIAGNOSIS — G4700 Insomnia, unspecified: Secondary | ICD-10-CM | POA: Diagnosis not present

## 2015-08-07 DIAGNOSIS — I10 Essential (primary) hypertension: Secondary | ICD-10-CM

## 2015-08-07 DIAGNOSIS — E161 Other hypoglycemia: Secondary | ICD-10-CM

## 2015-08-07 DIAGNOSIS — Z6841 Body Mass Index (BMI) 40.0 and over, adult: Secondary | ICD-10-CM | POA: Diagnosis not present

## 2015-08-07 DIAGNOSIS — E162 Hypoglycemia, unspecified: Secondary | ICD-10-CM

## 2015-08-07 LAB — COMPREHENSIVE METABOLIC PANEL
ALT: 41 U/L — ABNORMAL HIGH (ref 0–35)
AST: 36 U/L (ref 0–37)
Albumin: 4.2 g/dL (ref 3.5–5.2)
Alkaline Phosphatase: 43 U/L (ref 39–117)
BILIRUBIN TOTAL: 0.6 mg/dL (ref 0.2–1.2)
BUN: 7 mg/dL (ref 6–23)
CALCIUM: 8.9 mg/dL (ref 8.4–10.5)
CHLORIDE: 108 meq/L (ref 96–112)
CO2: 23 meq/L (ref 19–32)
Creatinine, Ser: 0.61 mg/dL (ref 0.40–1.20)
GFR: 137.41 mL/min (ref >60.00)
GLUCOSE: 101 mg/dL — AB (ref 70–99)
Potassium: 3.7 mEq/L (ref 3.5–5.1)
Sodium: 138 mEq/L (ref 135–145)
Total Protein: 7.3 g/dL (ref 6.0–8.3)

## 2015-08-07 LAB — MICROALBUMIN / CREATININE URINE RATIO
CREATININE, U: 41 mg/dL
Microalb Creat Ratio: 1.7 mg/g (ref 0.0–30.0)
Microalb, Ur: <0.7 mg/dL (ref 0.0–1.9)

## 2015-08-07 LAB — HEMOGLOBIN A1C: HEMOGLOBIN A1C: 5.6 % (ref 4.6–6.5)

## 2015-08-07 LAB — TSH: TSH: 1.04 u[IU]/mL (ref 0.35–4.50)

## 2015-08-07 LAB — LDL CHOLESTEROL, DIRECT: Direct LDL: 54 mg/dL

## 2015-08-07 NOTE — Progress Notes (Signed)
Subjective:  Patient ID: [name], female    DOB: 1970-12-30  Age: 45 y.o. MRN: [id number]  CC: The primary encounter diagnosis was Reactive hypoglycemia. Diagnoses of Morbid obesity with BMI of 40.0-44.9, adult [facility]), Essential hypertension, and Insomnia were also pertinent to this visit.  HPI Melanie Chavez presents for follow up on hypertension,  Reactive hypoglycemia secondary to obesity and diet, insomnia and history of snoring . Last seen in July for annual exam. 8 lb weight gain noted since then.    Disgusted with her weight gain despite feeling as though she  has been  "Depriving herself"  Of potatoes,  Biscuits, etc.  Diet reviewe:   Oatmeal and boiled egg  In the morning Lunch is either a salad with grilled chicken .  Drinks a sweet tea  Dinner:  Bowl of cereal corn flakes with a spoon of sugar .  With reduced fat milk. Snacks on a cup of yogurt : regular food lion  Exercising 2 days a week to music with daughter.     Outpatient Prescriptions Prior to Visit  Medication Sig Dispense Refill  . LARIN FE 1.5/30 1.5-30 MG-MCG tablet TAKE 1 TABLET BY MOUTH DAILY. 84 tablet 3  . losartan-hydrochlorothiazide (HYZAAR) 50-12.5 MG per tablet Take 1 tablet by mouth daily. 90 tablet 3  . metoprolol succinate (TOPROL-XL) 50 MG 24 hr tablet Take 1 tablet (50 mg total) by mouth daily. Take with or immediately following a meal. 90 tablet 3  . zolpidem (AMBIEN) 5 MG tablet TAKE ONE TABLET BY MOUTH AT BEDTIME AS NEEDED 30 tablet 0  . nystatin ointment (MYCOSTATIN) Apply topically 2 (two) times daily. Until rash resolves (Patient not taking: Reported on 08/07/2015) 30 g 1  . sulfacetamide (BLEPH-10) 10 % ophthalmic solution Place 1 drop into the left eye every 4 (four) hours. X 5 days (Patient not taking: Reported on 07/10/2015) 15 mL 0  . amoxicillin (AMOXIL) 875 MG tablet Take 1 tablet (875 mg total) by mouth 2 (two) times daily. 20 tablet 0  . fluconazole (DIFLUCAN) 150 MG tablet  Take 1 tablet (150 mg total) by mouth once. 1 tablet 0  . predniSONE (DELTASONE) 10 MG tablet Take 3 tablets (30 mg total) by mouth daily with breakfast. 9 tablet 0   No facility-administered medications prior to visit.    Review of Systems;  Patient denies headache, fevers, malaise, unintentional weight loss, skin rash, eye pain, sinus congestion and sinus pain, sore throat, dysphagia,  hemoptysis , cough, dyspnea, wheezing, chest pain, palpitations, orthopnea, edema, abdominal pain, nausea, melena, diarrhea, constipation, flank pain, dysuria, hematuria, urinary  Frequency, nocturia, numbness, tingling, seizures,  Focal weakness, Loss of consciousness,  Tremor, insomnia, depression, anxiety, and suicidal ideation.      Objective:  BP 140/88 mmHg  Pulse 68  Temp(Src) 97.9 F (36.6 C) (Oral)  Resp 12  Ht 5\' 6"  (1.676 m)  Wt 274 lb 8 oz (124.512 kg)  BMI 44.33 kg/m2  SpO2 98%  BP Readings from Last 3 Encounters:  08/07/15 140/88  07/10/15 140/92  03/20/15 134/100    Wt Readings from Last 3 Encounters:  08/07/15 274 lb 8 oz (124.512 kg)  12/28/14 266 lb (120.657 kg)  07/20/14 270 lb 4 oz (122.585 kg)    General appearance: alert, cooperative and appears stated age Ears: normal TM's and external ear canals both ears Throat: lips, mucosa, and tongue normal; teeth and gums normal Neck: no adenopathy, no carotid bruit, supple, symmetrical, trachea midline  and thyroid not enlarged, symmetric, no tenderness/mass/nodules Back: symmetric, no curvature. ROM normal. No CVA tenderness. Lungs: clear to auscultation bilaterally Heart: regular rate and rhythm, S1, S2 normal, no murmur, click, rub or gallop Abdomen: soft, non-tender; bowel sounds normal; no masses,  no organomegaly Pulses: 2+ and symmetric Skin: Skin color, texture, turgor normal. No rashes or lesions Lymph nodes: Cervical, supraclavicular, and axillary nodes normal.  Lab Results  Component Value Date   HGBA1C 5.6  08/07/2015   HGBA1C 5.3 12/28/2014   HGBA1C 5.5 12/20/2013    Lab Results  Component Value Date   CREATININE 0.61 08/07/2015   CREATININE 0.77 12/28/2014   CREATININE 0.82 07/20/2014    Lab Results  Component Value Date   WBC 5.0 12/20/2013   HGB 12.3 12/20/2013   HCT 38.0 12/20/2013   PLT 348.0 12/20/2013   GLUCOSE 101* 08/07/2015   CHOL 109 12/28/2014   TRIG 55.0 12/28/2014   HDL 42.10 12/28/2014   LDLDIRECT 54.0 08/07/2015   LDLCALC 56 12/28/2014   ALT 41* 08/07/2015   AST 36 08/07/2015   NA 138 08/07/2015   K 3.7 08/07/2015   CL 108 08/07/2015   CREATININE 0.61 08/07/2015   BUN 7 08/07/2015   CO2 23 08/07/2015   TSH 1.04 08/07/2015   HGBA1C 5.6 08/07/2015   MICROALBUR <0.7 08/07/2015    No results found.  Assessment & Plan:   Problem List Items Addressed This Visit    Morbid obesity with BMI of 40.0-44.9, adult ([place])    I have addressed  BMI, reviewed her diet in detail  and recommended wt loss of 10% of body weight over the next 6 months using a low glycemic index diet and increasing her  exercise to a minimum of 5 days per week.        Relevant Orders   LDL cholesterol, direct (Completed)   TSH (Completed)   Hypertension    Well controlled on current regimen. Renal function stable, no changes today.  Lab Results  Component Value Date   CREATININE 0.61 08/07/2015   Lab Results  Component Value Date   NA 138 08/07/2015   K 3.7 08/07/2015   CL 108 08/07/2015   CO2 23 08/07/2015         Relevant Orders   Microalbumin / creatinine urine ratio (Completed)   Comprehensive metabolic panel (Completed)   Insomnia    managed with ambien prn. The risks and benefits of hypnotics were reviewed with patient today including excessive sedation leading to respiratory depression,  impaired thinking/driving, and addiction.  Patient was advised to avoid concurrent use with alcohol, to use medication only as needed and not to share with others  .             Reactive hypoglycemia - Primary   Relevant Orders   Hemoglobin A1c (Completed)      A total of 25 minutes of face to face time was spent with patient more than half of which was spent in counselling about the above mentioned conditions  and coordination of care   I have discontinued Ms. Swendsen's amoxicillin, predniSONE, and fluconazole. I am also having her maintain her nystatin ointment, losartan-hydrochlorothiazide, metoprolol succinate, sulfacetamide, LARIN FE 1.5/30, and zolpidem.  No orders of the defined types were placed in this encounter.    Medications Discontinued During This Encounter  Medication Reason  . amoxicillin (AMOXIL) 875 MG tablet Completed Course  . fluconazole (DIFLUCAN) 150 MG tablet Completed Course  . predniSONE (DELTASONE) 10  MG tablet Completed Course    Follow-up: Return in about 4 weeks (around 09/04/2015).   [name], MD

## 2015-08-07 NOTE — Patient Instructions (Signed)
I am increasing your losartan to 2 tablets daily  Your blood pressure will improve when you start exercising 5 days per week  I want you to dance for 30 minutes 5 DAYS PER WEEK!   DO NOT SKIP BREAKFAST!  HERE ARE A FEW BETTER CHOICES FOR BREAKFAST  1) Premier Protein low carb shake 2) [employer] breakfast frittata that can be microwaved in 2 minutes and is very low carb. Frittatas are similar to quiches without the crust   AVOID GRANOLA!   Use chopped nuts and whipped cream with your yogurt Try the [employer] Glightn n Fit Greek yogurt: less sugar,  More protein   CHANGE YOUR CEREAL! Try the Heritage Flakes cereal available at [facility] on the bottom shelf,  Try using vanilla flavored almond milk

## 2015-08-07 NOTE — Progress Notes (Signed)
Pre-visit discussion using our clinic review tool. No additional management support is needed unless otherwise documented below in the visit note.  

## 2015-08-08 ENCOUNTER — Encounter: Payer: Self-pay | Admitting: Internal Medicine

## 2015-08-08 NOTE — Assessment & Plan Note (Signed)
I have addressed  BMI, reviewed her diet in detail  and recommended wt loss of 10% of body weight over the next 6 months using a low glycemic index diet and increasing her  exercise to a minimum of 5 days per week.

## 2015-08-08 NOTE — Assessment & Plan Note (Signed)
Well controlled on current regimen. Renal function stable, no changes today.  Lab Results  Component Value Date   CREATININE 0.61 08/07/2015   Lab Results  Component Value Date   NA 138 08/07/2015   K 3.7 08/07/2015   CL 108 08/07/2015   CO2 23 08/07/2015

## 2015-08-08 NOTE — Assessment & Plan Note (Signed)
managed with ambien prn. The risks and benefits of hypnotics were reviewed with patient today including excessive sedation leading to respiratory depression,  impaired thinking/driving, and addiction.  Patient was advised to avoid concurrent use with alcohol, to use medication only as needed and not to share with others  .      

## 2015-08-14 ENCOUNTER — Other Ambulatory Visit: Payer: Self-pay | Admitting: Internal Medicine

## 2015-08-14 NOTE — Telephone Encounter (Signed)
Refilled 07/17/15. Last seen 08/07/15. Please advise?

## 2015-08-17 NOTE — Telephone Encounter (Signed)
refilled 

## 2015-08-18 ENCOUNTER — Other Ambulatory Visit: Payer: Self-pay | Admitting: Internal Medicine

## 2015-08-21 NOTE — Telephone Encounter (Signed)
Pt called to check the status of her refill.zolpidem (AMBIEN) 5 MG tablet Call pt @ [phone]. Thank you!

## 2015-08-22 NOTE — Telephone Encounter (Signed)
Mailed unread message

## 2015-09-25 ENCOUNTER — Encounter: Payer: Self-pay | Admitting: Physician Assistant

## 2015-09-25 ENCOUNTER — Ambulatory Visit: Payer: Self-pay | Admitting: Physician Assistant

## 2015-09-25 VITALS — BP 132/80 | HR 76 | Temp 98.5°F

## 2015-09-25 DIAGNOSIS — H6981 Other specified disorders of Eustachian tube, right ear: Secondary | ICD-10-CM

## 2015-09-25 MED ORDER — AMOXICILLIN 875 MG PO TABS
875.0000 mg | ORAL_TABLET | Freq: Two times a day (BID) | ORAL | Status: DC
Start: 2015-09-25 — End: 2015-11-28

## 2015-09-25 MED ORDER — FLUTICASONE PROPIONATE 50 MCG/ACT NA SUSP: 2.0000 | Freq: Every day | NASAL | Status: DC

## 2015-09-25 MED ORDER — PREDNISONE 10 MG PO TABS: 30.0000 mg | ORAL_TABLET | Freq: Every day | ORAL | Status: DC

## 2015-09-25 NOTE — Progress Notes (Signed)
S:  C/o ears popping and being stopped up, can't  Hear like normal, is muffled; no drainage from ears, no fever/chills, no cough or congestion, some sinus pressure, remainder ros neg Using otc meds without relief  O:  Vitals wnl, nad, tms dull b/l ; r tm is much more swollen and has some redness when compared to left; nasal mucosa swollen, throat wnl, neck supple no lymph, lungs c t a, cv rrr, neuro intact  A: acute eustachean tube dysfunction, AOM  P: flonase,  prednisone 30mg  qd x 3d, amoxil 875mg  bid x 10d, return if not improving in 3 to 5 days, return earlier if worsening

## 2015-11-13 ENCOUNTER — Encounter: Payer: Self-pay | Admitting: Internal Medicine

## 2015-11-28 ENCOUNTER — Ambulatory Visit: Payer: Self-pay | Admitting: Family

## 2015-11-28 ENCOUNTER — Encounter: Payer: Self-pay | Admitting: Family

## 2015-11-28 ENCOUNTER — Ambulatory Visit (INDEPENDENT_AMBULATORY_CARE_PROVIDER_SITE_OTHER): Payer: 59 | Admitting: Family

## 2015-11-28 ENCOUNTER — Ambulatory Visit (INDEPENDENT_AMBULATORY_CARE_PROVIDER_SITE_OTHER): Payer: 59

## 2015-11-28 VITALS — BP 150/100 | HR 64 | Temp 98.5°F | Ht 66.0 in | Wt 274.8 lb

## 2015-11-28 DIAGNOSIS — M79672 Pain in left foot: Secondary | ICD-10-CM | POA: Diagnosis not present

## 2015-11-28 DIAGNOSIS — M25572 Pain in left ankle and joints of left foot: Secondary | ICD-10-CM | POA: Diagnosis not present

## 2015-11-28 DIAGNOSIS — M7989 Other specified soft tissue disorders: Secondary | ICD-10-CM | POA: Diagnosis not present

## 2015-11-28 NOTE — Progress Notes (Signed)
Subjective:    Patient ID: Melanie Chavez, female    DOB: March 16, 1971, 45 y.o.   MRN: [id number]   Melanie Chavez is a 45 y.o. female who presents today for an acute visit.    HPI Comments: Patient here for evaluation of left lateral foot pain for the past couple weeks, worsening. Describes pain as crampy pain. Radiates up left calf. Pain worse with walking. Pain improves with rest and elevation. Tried tylenol and ibuprofen with some relief. No injury. Endorses ankle swelling and top of foot. No h/o gout, no h/o blood clots and no recent long car rides. No h/o fracture. Doesn't feel like plantar fasciitis. Wearing good shoe support.   Past Medical History  Diagnosis Date  . Hypertension   . Obesity (BMI 30-39.9)   . H/O pelvic inflammatory disease   . Plantar fasciitis   . Reactive hypoglycemia 2011  . History of recurrent miscarriages, not currently pregnant    Allergies: Review of patient's allergies indicates no known allergies. Current Outpatient Prescriptions on File Prior to Visit  Medication Sig Dispense Refill  . fluticasone (FLONASE) 50 MCG/ACT nasal spray Place 2 sprays into both nostrils daily. 16 g 6  . LARIN FE 1.5/30 1.5-30 MG-MCG tablet TAKE 1 TABLET BY MOUTH DAILY. 84 tablet 3  . losartan-hydrochlorothiazide (HYZAAR) 50-12.5 MG tablet TAKE 1 TABLET BY MOUTH DAILY. 90 tablet 2  . metoprolol succinate (TOPROL-XL) 50 MG 24 hr tablet TAKE 1 TABLET BY MOUTH DAILY. TAKE WITH OR IMMEDIATELY FOLLOWING A MEAL. 90 tablet 2  . nystatin ointment (MYCOSTATIN) Apply topically 2 (two) times daily. Until rash resolves 30 g 1  . sulfacetamide (BLEPH-10) 10 % ophthalmic solution Place 1 drop into the left eye every 4 (four) hours. X 5 days 15 mL 0  . zolpidem (AMBIEN) 5 MG tablet TAKE 1 TABLET BY MOUTH NIGHTLY AT BEDTIME AS NEEDED 30 tablet 5   No current facility-administered medications on file prior to visit.    Social History  Substance Use Topics  . Smoking status: Never  Smoker   . Smokeless tobacco: Never Used  . Alcohol Use: None    Review of Systems  Constitutional: Negative for fever and chills.  Respiratory: Negative for cough.   Cardiovascular: Negative for chest pain and palpitations.  Gastrointestinal: Negative for nausea and vomiting.  Musculoskeletal: Positive for joint swelling. Negative for arthralgias.      Objective:    BP 150/100 mmHg  Pulse 64  Temp(Src) 98.5 F (36.9 C)  Ht 5\' 6"  (1.676 m)  Wt 274 lb 12.8 oz (124.648 kg)  BMI 44.37 kg/m2  SpO2 98%  LMP 11/11/2015   Physical Exam  Constitutional: She appears well-developed and well-nourished.  Eyes: Conjunctivae are normal.  Cardiovascular: Normal rate, regular rhythm, normal heart sounds and normal pulses.   No palpable cords or masses. No swelling of calves. No erythema or increased warmth. Negative Homan sign bilaterally.  LE hair growth symmetric and present. No discoloration of varicosities noted. LE warm and palpable pedal pulses.   Pulmonary/Chest: Effort normal and breath sounds normal. She has no wheezes. She has no rhonchi. She has no rales.  Musculoskeletal:       Left ankle: She exhibits swelling. She exhibits normal range of motion and normal pulse. Tenderness. Lateral malleolus tenderness found.       Left foot: There is tenderness and swelling. There is normal range of motion, no deformity and no laceration.       Feet:  Trace swelling and tenderness with palpation over left lateral malleolus and left lateral foot. No increased warmth, rash. No pain with palpation of the plantar fascia. No pain with dorsiflexion or plantar flexion. No pain with palpation of great toe.  Neurological: She is alert.  Skin: Skin is warm and dry.  Psychiatric: She has a normal mood and affect. Her speech is normal and behavior is normal. Thought content normal.  Vitals reviewed.      Assessment & Plan:   1. Left ankle pain Suspect ankle sprain although patient does not  recall any injury. Low suspicion for gout as pain has improved with rest and worse after walking, standing. Patient and I agreed on conservative therapy using the Rice method. Pending x-rays for further evaluation for gouty arthritis, arthritis, or occult fracture.  - DG Ankle Complete Left; Future - DG Foot Complete Left; Future    I have discontinued Ms. Cornforth's amoxicillin and predniSONE. I am also having her maintain her nystatin ointment, sulfacetamide, LARIN FE 1.5/30, metoprolol succinate, losartan-hydrochlorothiazide, zolpidem, and fluticasone.   No orders of the defined types were placed in this encounter.     Start medications as prescribed and explained to patient on After Visit Summary ( AVS). Risks, benefits, and alternatives of the medications and treatment plan prescribed today were discussed, and patient expressed understanding.   Education regarding symptom management and diagnosis given to patient.   Follow-up:Plan follow-up and return precautions given if any worsening symptoms or change in condition.   Continue to follow with [name], MD for routine health maintenance.   Melanie Chavez and I agreed with plan.   Mable Paris, FNP

## 2015-11-28 NOTE — Patient Instructions (Signed)
Rest, ice, compression, elevation ( RICE).   If there is no improvement in your symptoms, or if there is any worsening of symptoms, or if you have any additional concerns, please return for re-evaluation; or, if we are closed, consider going to the Emergency Room for evaluation if symptoms urgent.

## 2015-11-29 ENCOUNTER — Other Ambulatory Visit: Payer: Self-pay | Admitting: Family

## 2015-11-29 DIAGNOSIS — M79672 Pain in left foot: Secondary | ICD-10-CM

## 2016-01-09 ENCOUNTER — Ambulatory Visit (INDEPENDENT_AMBULATORY_CARE_PROVIDER_SITE_OTHER): Payer: 59 | Admitting: Sports Medicine

## 2016-01-09 ENCOUNTER — Ambulatory Visit: Payer: Self-pay

## 2016-01-09 ENCOUNTER — Encounter: Payer: Self-pay | Admitting: Sports Medicine

## 2016-01-09 DIAGNOSIS — M779 Enthesopathy, unspecified: Secondary | ICD-10-CM

## 2016-01-09 DIAGNOSIS — R52 Pain, unspecified: Secondary | ICD-10-CM

## 2016-01-09 DIAGNOSIS — M2142 Flat foot [pes planus] (acquired), left foot: Secondary | ICD-10-CM | POA: Diagnosis not present

## 2016-01-09 DIAGNOSIS — M2141 Flat foot [pes planus] (acquired), right foot: Secondary | ICD-10-CM

## 2016-01-09 MED ORDER — METHYLPREDNISOLONE 4 MG PO TBPK: ORAL_TABLET | ORAL | 0 refills | Status: DC

## 2016-01-09 MED ORDER — DICLOFENAC SODIUM 75 MG PO TBEC: 75.0000 mg | DELAYED_RELEASE_TABLET | Freq: Two times a day (BID) | ORAL | 0 refills | Status: DC

## 2016-01-09 MED ORDER — TRIAMCINOLONE ACETONIDE 10 MG/ML IJ SUSP: 10.0000 mg | Freq: Once | INTRAMUSCULAR | Status: DC

## 2016-01-09 NOTE — Progress Notes (Signed)
Subjective: Melanie Chavez is a 45 y.o. female patient who presents to office for evaluation of Left foot pain. Patient complains of progressive pain especially over the last month in the left foot at the lateral foot that is worse with working or after being on her feet for long periods of time. Patient has tried icing and advil with no relief in symptoms. Went to PCP who xray the foot and recommended anti-inflammatories. Patient denies any other pedal complaints. Denies injury/trip/fall/sprain/any causative factors.   Patient Active Problem List   Diagnosis Date Noted  . Insomnia 07/22/2014  . Snoring 05/03/2013  . Visit for preventive health examination 10/17/2011  . H/O pelvic inflammatory disease   . Plantar fasciitis   . Reactive hypoglycemia   . History of recurrent miscarriages, not currently pregnant   . Morbid obesity with BMI of 40.0-44.9, adult ([place]) 07/17/2011  . Hypertension 07/17/2011    Current Outpatient Prescriptions on File Prior to Visit  Medication Sig Dispense Refill  . fluticasone (FLONASE) 50 MCG/ACT nasal spray Place 2 sprays into both nostrils daily. 16 g 6  . LARIN FE 1.5/30 1.5-30 MG-MCG tablet TAKE 1 TABLET BY MOUTH DAILY. 84 tablet 3  . losartan-hydrochlorothiazide (HYZAAR) 50-12.5 MG tablet TAKE 1 TABLET BY MOUTH DAILY. 90 tablet 2  . metoprolol succinate (TOPROL-XL) 50 MG 24 hr tablet TAKE 1 TABLET BY MOUTH DAILY. TAKE WITH OR IMMEDIATELY FOLLOWING A MEAL. 90 tablet 2  . nystatin ointment (MYCOSTATIN) Apply topically 2 (two) times daily. Until rash resolves 30 g 1  . sulfacetamide (BLEPH-10) 10 % ophthalmic solution Place 1 drop into the left eye every 4 (four) hours. X 5 days 15 mL 0  . zolpidem (AMBIEN) 5 MG tablet TAKE 1 TABLET BY MOUTH NIGHTLY AT BEDTIME AS NEEDED 30 tablet 5   No current facility-administered medications on file prior to visit.     No Known Allergies  Objective:  General: Alert and oriented x3 in no acute  distress  Dermatology: No open lesions bilateral lower extremities, no webspace macerations, no ecchymosis bilateral, all nails x 10 are well manicured.  Vascular: Dorsalis Pedis and Posterior Tibial pedal pulses palpable, Capillary Fill Time 3 seconds,(+) pedal hair growth bilateral, no edema bilateral lower extremities, Temperature gradient within normal limits.  Neurology: Johney Maine sensation intact via light touch bilateral. (- )Tinels sign bilateral.   Musculoskeletal: Mild tenderness with palpation at peroneal tendon at 5th met base worse with stressing midtarsal joint left foot,No pain with calf compression bilateral. There is decreased ankle rom with knee extending  vs flexed resembling gastroc equnius bilateral, Subtalar joint range of motion is within normal limits, there is no 1st ray hypermobility noted bilateral, Pes planus foot type. Strength within normal limits in all groups bilateral.    Assessment and Plan: Problem List Items Addressed This Visit    None    Visit Diagnoses    Pain    -  Primary   Relevant Medications   methylPREDNISolone (MEDROL DOSEPAK) 4 MG TBPK tablet   diclofenac (VOLTAREN) 75 MG EC tablet   Tendonitis       Peroneal   Relevant Medications   methylPREDNISolone (MEDROL DOSEPAK) 4 MG TBPK tablet   diclofenac (VOLTAREN) 75 MG EC tablet   triamcinolone acetonide (KENALOG) 10 MG/ML injection 10 mg (Start on 01/09/2016  9:00 AM)   Pes planus of both feet       Relevant Medications   methylPREDNISolone (MEDROL DOSEPAK) 4 MG TBPK tablet   diclofenac (VOLTAREN)  75 MG EC tablet     -Complete examination performed -Previous Xrays reviewed -Discussed treatement options for likely tendonitis secondary to foot type -After oral consent and aseptic prep, injected a mixture containing 1 ml of 2%  plain lidocaine, 1 ml 0.5% plain marcaine, 0.5 ml of kenalog 10 and 0.5 ml of dexamethasone phosphate into Left peroneal tendon sheath without complication. Post-injection  care discussed with patient.  -Rx Medrol dose pack and diclofenac and instructed on use -Dispensed fascial brace strap from medial to lateral -Recommend good supportive shoes and inserts for foot type -Recommend icing 1-2x daily  -Patient to return to office in 3 weeks or sooner if condition worsens.  Landis Martins, DPM

## 2016-01-30 ENCOUNTER — Ambulatory Visit (INDEPENDENT_AMBULATORY_CARE_PROVIDER_SITE_OTHER): Payer: 59 | Admitting: Internal Medicine

## 2016-01-30 ENCOUNTER — Encounter: Payer: Self-pay | Admitting: Internal Medicine

## 2016-01-30 VITALS — BP 140/90 | HR 67 | Temp 98.1°F | Resp 14 | Ht 65.5 in | Wt 277.5 lb

## 2016-01-30 DIAGNOSIS — Z6841 Body Mass Index (BMI) 40.0 and over, adult: Secondary | ICD-10-CM

## 2016-01-30 DIAGNOSIS — G4700 Insomnia, unspecified: Secondary | ICD-10-CM

## 2016-01-30 DIAGNOSIS — E162 Hypoglycemia, unspecified: Secondary | ICD-10-CM | POA: Diagnosis not present

## 2016-01-30 DIAGNOSIS — Z23 Encounter for immunization: Secondary | ICD-10-CM | POA: Diagnosis not present

## 2016-01-30 DIAGNOSIS — Z0000 Encounter for general adult medical examination without abnormal findings: Secondary | ICD-10-CM

## 2016-01-30 DIAGNOSIS — I10 Essential (primary) hypertension: Secondary | ICD-10-CM | POA: Diagnosis not present

## 2016-01-30 DIAGNOSIS — E161 Other hypoglycemia: Secondary | ICD-10-CM

## 2016-01-30 DIAGNOSIS — D62 Acute posthemorrhagic anemia: Secondary | ICD-10-CM

## 2016-01-30 MED ORDER — LOSARTAN POTASSIUM-HCTZ 100-12.5 MG PO TABS: 1.0000 | ORAL_TABLET | Freq: Every day | ORAL | 3 refills | Status: DC

## 2016-01-30 MED ORDER — ZOLPIDEM TARTRATE 5 MG PO TABS: ORAL_TABLET | ORAL | 5 refills | Status: DC

## 2016-01-30 NOTE — Progress Notes (Signed)
Pre-visit discussion using our clinic review tool. No additional management support is needed unless otherwise documented below in the visit note.  

## 2016-01-30 NOTE — Patient Instructions (Signed)
Stop eating oatmeal!  It is loaded with carbohydrates  Drink a protein shake in the morning:  Atkins,  EAS Advantedge or Premier Protein,  Or SlimFast  Eat something every 3 hours to stimulate your metabolism and keep your blood sugar from dropping   Mammogram ordered Health Maintenance, Female Adopting a healthy lifestyle and getting preventive care can go a long way to promote health and wellness. Talk with your health care provider about what schedule of regular examinations is right for you. This is a good chance for you to check in with your provider about disease prevention and staying healthy. In between checkups, there are plenty of things you can do on your own. Experts have done a lot of research about which lifestyle changes and preventive measures are most likely to keep you healthy. Ask your health care provider for more information. WEIGHT AND DIET  Eat a healthy diet  Be sure to include plenty of vegetables, fruits, low-fat dairy products, and lean protein.  Do not eat a lot of foods high in solid fats, added sugars, or salt.  Get regular exercise. This is one of the most important things you can do for your health.  Most adults should exercise for at least 150 minutes each week. The exercise should increase your heart rate and make you sweat (moderate-intensity exercise).  Most adults should also do strengthening exercises at least twice a week. This is in addition to the moderate-intensity exercise.  Maintain a healthy weight  Body mass index (BMI) is a measurement that can be used to identify possible weight problems. It estimates body fat based on height and weight. Your health care provider can help determine your BMI and help you achieve or maintain a healthy weight.  For females 58 years of age and older:   A BMI below 18.5 is considered underweight.  A BMI of 18.5 to 24.9 is normal.  A BMI of 25 to 29.9 is considered overweight.  A BMI of 30 and above is  considered obese.  Watch levels of cholesterol and blood lipids  You should start having your blood tested for lipids and cholesterol at 44 years of age, then have this test every 5 years.  You may need to have your cholesterol levels checked more often if:  Your lipid or cholesterol levels are high.  You are older than 44 years of age.  You are at high risk for heart disease.  CANCER SCREENING   Lung Cancer  Lung cancer screening is recommended for adults 65-68 years old who are at high risk for lung cancer because of a history of smoking.  A yearly low-dose CT scan of the lungs is recommended for people who:  Currently smoke.  Have quit within the past 15 years.  Have at least a 30-pack-year history of smoking. A pack year is smoking an average of one pack of cigarettes a day for 1 year.  Yearly screening should continue until it has been 15 years since you quit.  Yearly screening should stop if you develop a health problem that would prevent you from having lung cancer treatment.  Breast Cancer  Practice breast self-awareness. This means understanding how your breasts normally appear and feel.  It also means doing regular breast self-exams. Let your health care provider know about any changes, no matter how small.  If you are in your 20s or 30s, you should have a clinical breast exam (CBE) by a health care provider every 1-3 years as  part of a regular health exam.  If you are 40 or older, have a CBE every year. Also consider having a breast X-ray (mammogram) every year.  If you have a family history of breast cancer, talk to your health care provider about genetic screening.  If you are at high risk for breast cancer, talk to your health care provider about having an MRI and a mammogram every year.  Breast cancer gene (BRCA) assessment is recommended for women who have family members with BRCA-related cancers. BRCA-related cancers  include:  Breast.  Ovarian.  Tubal.  Peritoneal cancers.  Results of the assessment will determine the need for genetic counseling and BRCA1 and BRCA2 testing. Cervical Cancer Your health care provider may recommend that you be screened regularly for cancer of the pelvic organs (ovaries, uterus, and vagina). This screening involves a pelvic examination, including checking for microscopic changes to the surface of your cervix (Pap test). You may be encouraged to have this screening done every 3 years, beginning at age 21.  For women ages 30-65, health care providers may recommend pelvic exams and Pap testing every 3 years, or they may recommend the Pap and pelvic exam, combined with testing for human papilloma virus (HPV), every 5 years. Some types of HPV increase your risk of cervical cancer. Testing for HPV may also be done on women of any age with unclear Pap test results.  Other health care providers may not recommend any screening for nonpregnant women who are considered low risk for pelvic cancer and who do not have symptoms. Ask your health care provider if a screening pelvic exam is right for you.  If you have had past treatment for cervical cancer or a condition that could lead to cancer, you need Pap tests and screening for cancer for at least 20 years after your treatment. If Pap tests have been discontinued, your risk factors (such as having a new sexual partner) need to be reassessed to determine if screening should resume. Some women have medical problems that increase the chance of getting cervical cancer. In these cases, your health care provider may recommend more frequent screening and Pap tests. Colorectal Cancer  This type of cancer can be detected and often prevented.  Routine colorectal cancer screening usually begins at 44 years of age and continues through 44 years of age.  Your health care provider may recommend screening at an earlier age if you have risk factors for  colon cancer.  Your health care provider may also recommend using home test kits to check for hidden blood in the stool.  A small camera at the end of a tube can be used to examine your colon directly (sigmoidoscopy or colonoscopy). This is done to check for the earliest forms of colorectal cancer.  Routine screening usually begins at age 50.  Direct examination of the colon should be repeated every 5-10 years through 44 years of age. However, you may need to be screened more often if early forms of precancerous polyps or small growths are found. Skin Cancer  Check your skin from head to toe regularly.  Tell your health care provider about any new moles or changes in moles, especially if there is a change in a mole's shape or color.  Also tell your health care provider if you have a mole that is larger than the size of a pencil eraser.  Always use sunscreen. Apply sunscreen liberally and repeatedly throughout the day.  Protect yourself by wearing long sleeves, pants,   a wide-brimmed hat, and sunglasses whenever you are outside. HEART DISEASE, DIABETES, AND HIGH BLOOD PRESSURE   High blood pressure causes heart disease and increases the risk of stroke. High blood pressure is more likely to develop in:  People who have blood pressure in the high end of the normal range (130-139/85-89 mm Hg).  People who are overweight or obese.  People who are African American.  If you are 18-39 years of age, have your blood pressure checked every 3-5 years. If you are 40 years of age or older, have your blood pressure checked every year. You should have your blood pressure measured twice--once when you are at a hospital or clinic, and once when you are not at a hospital or clinic. Record the average of the two measurements. To check your blood pressure when you are not at a hospital or clinic, you can use:  An automated blood pressure machine at a pharmacy.  A home blood pressure monitor.  If you  are between 55 years and 79 years old, ask your health care provider if you should take aspirin to prevent strokes.  Have regular diabetes screenings. This involves taking a blood sample to check your fasting blood sugar level.  If you are at a normal weight and have a low risk for diabetes, have this test once every three years after 45 years of age.  If you are overweight and have a high risk for diabetes, consider being tested at a younger age or more often. PREVENTING INFECTION  Hepatitis B  If you have a higher risk for hepatitis B, you should be screened for this virus. You are considered at high risk for hepatitis B if:  You were born in a country where hepatitis B is common. Ask your health care provider which countries are considered high risk.  Your parents were born in a high-risk country, and you have not been immunized against hepatitis B (hepatitis B vaccine).  You have HIV or AIDS.  You use needles to inject street drugs.  You live with someone who has hepatitis B.  You have had sex with someone who has hepatitis B.  You get hemodialysis treatment.  You take certain medicines for conditions, including cancer, organ transplantation, and autoimmune conditions. Hepatitis C  Blood testing is recommended for:  Everyone born from 1945 through 1965.  Anyone with known risk factors for hepatitis C. Sexually transmitted infections (STIs)  You should be screened for sexually transmitted infections (STIs) including gonorrhea and chlamydia if:  You are sexually active and are younger than 44 years of age.  You are older than 44 years of age and your health care provider tells you that you are at risk for this type of infection.  Your sexual activity has changed since you were last screened and you are at an increased risk for chlamydia or gonorrhea. Ask your health care provider if you are at risk.  If you do not have HIV, but are at risk, it may be recommended that you  take a prescription medicine daily to prevent HIV infection. This is called pre-exposure prophylaxis (PrEP). You are considered at risk if:  You are sexually active and do not regularly use condoms or know the HIV status of your partner(s).  You take drugs by injection.  You are sexually active with a partner who has HIV. Talk with your health care provider about whether you are at high risk of being infected with HIV. If you choose to begin   PrEP, you should first be tested for HIV. You should then be tested every 3 months for as long as you are taking PrEP.  PREGNANCY   If you are premenopausal and you may become pregnant, ask your health care provider about preconception counseling.  If you may become pregnant, take 400 to 800 micrograms (mcg) of folic acid every day.  If you want to prevent pregnancy, talk to your health care provider about birth control (contraception). OSTEOPOROSIS AND MENOPAUSE   Osteoporosis is a disease in which the bones lose minerals and strength with aging. This can result in serious bone fractures. Your risk for osteoporosis can be identified using a bone density scan.  If you are 41 years of age or older, or if you are at risk for osteoporosis and fractures, ask your health care provider if you should be screened.  Ask your health care provider whether you should take a calcium or vitamin D supplement to lower your risk for osteoporosis.  Menopause may have certain physical symptoms and risks.  Hormone replacement therapy may reduce some of these symptoms and risks. Talk to your health care provider about whether hormone replacement therapy is right for you.  HOME CARE INSTRUCTIONS   Schedule regular health, dental, and eye exams.  Stay current with your immunizations.   Do not use any tobacco products including cigarettes, chewing tobacco, or electronic cigarettes.  If you are pregnant, do not drink alcohol.  If you are breastfeeding, limit how  much and how often you drink alcohol.  Limit alcohol intake to no more than 1 drink per day for nonpregnant women. One drink equals 12 ounces of beer, 5 ounces of wine, or 1 ounces of hard liquor.  Do not use street drugs.  Do not share needles.  Ask your health care provider for help if you need support or information about quitting drugs.  Tell your health care provider if you often feel depressed.  Tell your health care provider if you have ever been abused or do not feel safe at home.   This information is not intended to replace advice given to you by your health care provider. Make sure you discuss any questions you have with your health care provider.   Document Released: 12/10/2010 Document Revised: 06/17/2014 Document Reviewed: 04/28/2013 Elsevier Interactive Patient Education [employer].

## 2016-01-30 NOTE — Progress Notes (Signed)
Patient ID: RAVENSYMONE SCHEURING, female    DOB: [birth year]  Age: 45 y.o. MRN: [id number]  The patient is here for annual physical examination and management of other chronic and acute problems.  PAP normal 2016 Mammogram ordered July 2016 but not done    The risk factors are reflected in the social history.  The roster of all physicians providing medical care to patient - is listed in the Snapshot section of the chart.  Home safety : The patient has smoke detectors in the home. They wear seatbelts.  There are no firearms at home. There is no violence in the home.   There is no risks for hepatitis, STDs or HIV. There is no   history of blood transfusion. They have no travel history to infectious disease endemic areas of the world.   They do not  have excessive sun exposure. Discussed the need for sun protection: hats, long sleeves and use of sunscreen if there is significant sun exposure.   Diet: the importance of a healthy diet is discussed. She is not following a diet intended for weight loss.  The benefits of regular aerobic exercise were discussed. She has not been walking due to peroneal tendonitis. utes.   Depression screen: there are no signs or vegative symptoms of depression- irritability, change in appetite, anhedonia, sadness/tearfullness.  The following portions of the patient's history were reviewed and updated as appropriate: allergies, current medications, past family history, past medical history,  past surgical history, past social history  and problem list.  Visual acuity was not assessed per patient preference since she has regular follow up with her ophthalmologist. Hearing and body mass index were assessed and reviewed.   During the course of the visit the patient was educated and counseled about appropriate screening and preventive services including : fall prevention , diabetes screening, nutrition counseling, colorectal cancer screening, and recommended immunizations.     CC: There were no encounter diagnoses.  History Zanayah has a past medical history of H/O pelvic inflammatory disease; History of recurrent miscarriages, not currently pregnant; Hypertension; Obesity (BMI 30-39.9); Plantar fasciitis; and Reactive hypoglycemia (2011).   She has a past surgical history that includes Cholecystectomy (2007) and Gallbladder surgery.   Her family history includes Birth defects in her paternal uncle; Cancer in her maternal grandmother; Diabetes in her mother; Heart disease in her father, paternal aunt, paternal grandfather, and paternal uncle.She reports that she has never smoked. She has never used smokeless tobacco. Her alcohol and drug histories are not on file.  Outpatient Medications Prior to Visit  Medication Sig Dispense Refill  . LARIN FE 1.5/30 1.5-30 MG-MCG tablet TAKE 1 TABLET BY MOUTH DAILY. 84 tablet 3  . losartan-hydrochlorothiazide (HYZAAR) 50-12.5 MG tablet TAKE 1 TABLET BY MOUTH DAILY. 90 tablet 2  . metoprolol succinate (TOPROL-XL) 50 MG 24 hr tablet TAKE 1 TABLET BY MOUTH DAILY. TAKE WITH OR IMMEDIATELY FOLLOWING A MEAL. 90 tablet 2  . zolpidem (AMBIEN) 5 MG tablet TAKE 1 TABLET BY MOUTH NIGHTLY AT BEDTIME AS NEEDED 30 tablet 5  . diclofenac (VOLTAREN) 75 MG EC tablet Take 1 tablet (75 mg total) by mouth 2 (two) times daily. (Patient not taking: Reported on 01/30/2016) 30 tablet 0  . fluticasone (FLONASE) 50 MCG/ACT nasal spray Place 2 sprays into both nostrils daily. (Patient not taking: Reported on 01/30/2016) 16 g 6  . methylPREDNISolone (MEDROL DOSEPAK) 4 MG TBPK tablet Take first as instructed (Patient not taking: Reported on 01/30/2016) 21 tablet 0  .  nystatin ointment (MYCOSTATIN) Apply topically 2 (two) times daily. Until rash resolves (Patient not taking: Reported on 01/30/2016) 30 g 1  . sulfacetamide (BLEPH-10) 10 % ophthalmic solution Place 1 drop into the left eye every 4 (four) hours. X 5 days (Patient not taking: Reported on 01/30/2016)  15 mL 0   Facility-Administered Medications Prior to Visit  Medication Dose Route Frequency Provider Last Rate Last Dose  . triamcinolone acetonide (KENALOG) 10 MG/ML injection 10 mg  10 mg Other Once Landis Martins, DPM        Review of Systems  Objective:  BP 140/90   Pulse 67   Temp 98.1 F (36.7 C) (Oral)   Resp 14   Ht 5' 5.5" (1.664 m)   Wt 277 lb 8 oz (125.9 kg)   LMP 10/23/2015 (Approximate)   SpO2 98%   BMI 45.48 kg/m   Physical Exam    Assessment & Plan:   Problem List Items Addressed This Visit    None    Visit Diagnoses   None.     I am having Ms. Fripp maintain her nystatin ointment, sulfacetamide, LARIN FE 1.5/30, metoprolol succinate, losartan-hydrochlorothiazide, zolpidem, fluticasone, methylPREDNISolone, and diclofenac. We will continue to administer triamcinolone acetonide.  No orders of the defined types were placed in this encounter.   There are no discontinued medications.  Follow-up: No Follow-up on file.   [name], MD

## 2016-01-31 LAB — CBC WITH DIFFERENTIAL/PLATELET
BASOS ABS: 0.1 10*3/uL (ref 0.0–0.1)
Basophils Relative: 1.2 % (ref 0.0–3.0)
Eosinophils Absolute: 0.1 10*3/uL (ref 0.0–0.7)
Eosinophils Relative: 2.5 % (ref 0.0–5.0)
HEMATOCRIT: 38.1 % (ref 36.0–46.0)
HEMOGLOBIN: 13 g/dL (ref 12.0–15.0)
LYMPHS ABS: 2.5 10*3/uL (ref 0.7–4.0)
LYMPHS PCT: 42.4 % (ref 12.0–46.0)
MCHC: 34.1 g/dL (ref 30.0–36.0)
MCV: 93.2 fl (ref 78.0–100.0)
MONOS PCT: 9.2 % (ref 3.0–12.0)
Monocytes Absolute: 0.5 10*3/uL (ref 0.1–1.0)
NEUTROS PCT: 44.7 % (ref 43.0–77.0)
Neutro Abs: 2.6 10*3/uL (ref 1.4–7.7)
Platelets: 329 10*3/uL (ref 150.0–400.0)
RBC: 4.09 Mil/uL (ref 3.87–5.11)
RDW: 12.9 % (ref 11.5–15.5)
WBC: 5.9 10*3/uL (ref 4.0–10.5)

## 2016-01-31 LAB — COMPREHENSIVE METABOLIC PANEL
ALBUMIN: 4.2 g/dL (ref 3.5–5.2)
ALK PHOS: 40 U/L (ref 39–117)
ALT: 30 U/L (ref 0–35)
AST: 31 U/L (ref 0–37)
BILIRUBIN TOTAL: 0.4 mg/dL (ref 0.2–1.2)
BUN: 9 mg/dL (ref 6–23)
CALCIUM: 8.9 mg/dL (ref 8.4–10.5)
CO2: 28 mEq/L (ref 19–32)
Chloride: 103 mEq/L (ref 96–112)
Creatinine, Ser: 0.81 mg/dL (ref 0.40–1.20)
GFR: 98.84 mL/min (ref >60.00)
Glucose, Bld: 75 mg/dL (ref 70–99)
POTASSIUM: 3.9 meq/L (ref 3.5–5.1)
Sodium: 138 mEq/L (ref 135–145)
TOTAL PROTEIN: 7.4 g/dL (ref 6.0–8.3)

## 2016-01-31 LAB — LIPID PANEL
CHOLESTEROL: 119 mg/dL (ref 0–200)
HDL: 47.9 mg/dL (ref >39.00)
LDL CALC: 58 mg/dL (ref 0–99)
NONHDL: 71.58
Total CHOL/HDL Ratio: 2
Triglycerides: 70 mg/dL (ref 0.0–149.0)
VLDL: 14 mg/dL (ref 0.0–40.0)

## 2016-01-31 LAB — HEMOGLOBIN A1C: HEMOGLOBIN A1C: 5.6 % (ref 4.6–6.5)

## 2016-02-01 ENCOUNTER — Encounter: Payer: Self-pay | Admitting: Internal Medicine

## 2016-02-01 NOTE — Assessment & Plan Note (Signed)
managed with ambien prn. The risks and benefits of hypnotics were reviewed with patient today including excessive sedation leading to respiratory depression,  impaired thinking/driving, and addiction.  Patient was advised to avoid concurrent use with alcohol, to use medication only as needed and not to share with others  .      

## 2016-02-01 NOTE — Assessment & Plan Note (Signed)
Well controlled on current regimen. Renal function stable, no changes today.  Lab Results  Component Value Date   NA 138 01/30/2016   K 3.9 01/30/2016   CL 103 01/30/2016   CO2 28 01/30/2016   Lab Results  Component Value Date   CREATININE 0.81 01/30/2016

## 2016-02-01 NOTE — Assessment & Plan Note (Signed)
I have addressed  BMI and recommended a low glycemic index diet utilizing smaller more frequent meals to increase metabolism.  I have also recommended that patient start exercising with a goal of 30 minutes of aerobic exercise a minimum of 5 days per week. Screening for lipid disorders, thyroid and diabetes to be done today.   

## 2016-02-01 NOTE — Assessment & Plan Note (Signed)
Secondary to carbohydrate rich diet,  Now modified   .  She has no signs of DM by A1c  Lab Results  Component Value Date   HGBA1C 5.6 01/30/2016

## 2016-02-01 NOTE — Assessment & Plan Note (Signed)
Annual comprehensive preventive exam was done as well as an evaluation and management of chronic conditions .  During the course of the visit the patient was educated and counseled about appropriate screening and preventive services including :  diabetes screening, lipid analysis with projected  10 year  risk for CAD , nutrition counseling, breast, cervical and colorectal cancer screening, and recommended immunizations.  Printed recommendations for health maintenance screenings was given 

## 2016-02-02 ENCOUNTER — Ambulatory Visit (INDEPENDENT_AMBULATORY_CARE_PROVIDER_SITE_OTHER): Payer: 59 | Admitting: Sports Medicine

## 2016-02-02 ENCOUNTER — Encounter: Payer: Self-pay | Admitting: Sports Medicine

## 2016-02-02 DIAGNOSIS — M779 Enthesopathy, unspecified: Secondary | ICD-10-CM

## 2016-02-02 DIAGNOSIS — M79671 Pain in right foot: Secondary | ICD-10-CM

## 2016-02-02 DIAGNOSIS — M2141 Flat foot [pes planus] (acquired), right foot: Secondary | ICD-10-CM

## 2016-02-02 DIAGNOSIS — M2142 Flat foot [pes planus] (acquired), left foot: Secondary | ICD-10-CM

## 2016-02-02 DIAGNOSIS — M79672 Pain in left foot: Secondary | ICD-10-CM

## 2016-02-02 MED ORDER — TRIAMCINOLONE ACETONIDE 40 MG/ML IJ SUSP: 20.0000 mg | Freq: Once | INTRAMUSCULAR | Status: DC

## 2016-02-02 NOTE — Progress Notes (Signed)
Subjective: Melanie Chavez is a 45 y.o. female patient who returns to office for evaluation of Left foot pain. Patient states that after injection, She feels about 80% better. States that the pain happens every now and again, especially after long periods of walking. Admits that she has not iced like she was instructed. However, has been wearing fascial brace and has completed all medications with no problems.  Patient Active Problem List   Diagnosis Date Noted  . Insomnia 07/22/2014  . Snoring 05/03/2013  . Visit for preventive health examination 10/17/2011  . H/O pelvic inflammatory disease   . Peroneal tendonitis of left lower extremity   . Reactive hypoglycemia   . History of recurrent miscarriages, not currently pregnant   . Morbid obesity with BMI of 40.0-44.9, adult ([place]) 07/17/2011  . Hypertension 07/17/2011    Current Outpatient Prescriptions on File Prior to Visit  Medication Sig Dispense Refill  . diclofenac (VOLTAREN) 75 MG EC tablet Take 1 tablet (75 mg total) by mouth 2 (two) times daily. (Patient not taking: Reported on 01/30/2016) 30 tablet 0  . fluticasone (FLONASE) 50 MCG/ACT nasal spray Place 2 sprays into both nostrils daily. (Patient not taking: Reported on 01/30/2016) 16 g 6  . LARIN FE 1.5/30 1.5-30 MG-MCG tablet TAKE 1 TABLET BY MOUTH DAILY. 84 tablet 3  . losartan-hydrochlorothiazide (HYZAAR) 100-12.5 MG tablet Take 1 tablet by mouth daily. 90 tablet 3  . losartan-hydrochlorothiazide (HYZAAR) 50-12.5 MG tablet TAKE 1 TABLET BY MOUTH DAILY. 90 tablet 2  . methylPREDNISolone (MEDROL DOSEPAK) 4 MG TBPK tablet Take first as instructed (Patient not taking: Reported on 01/30/2016) 21 tablet 0  . metoprolol succinate (TOPROL-XL) 50 MG 24 hr tablet TAKE 1 TABLET BY MOUTH DAILY. TAKE WITH OR IMMEDIATELY FOLLOWING A MEAL. 90 tablet 2  . nystatin ointment (MYCOSTATIN) Apply topically 2 (two) times daily. Until rash resolves (Patient not taking: Reported on 01/30/2016) 30 g 1   . sulfacetamide (BLEPH-10) 10 % ophthalmic solution Place 1 drop into the left eye every 4 (four) hours. X 5 days (Patient not taking: Reported on 01/30/2016) 15 mL 0  . zolpidem (AMBIEN) 5 MG tablet TAKE 1 TABLET BY MOUTH NIGHTLY AT BEDTIME AS NEEDED 30 tablet 5   Current Facility-Administered Medications on File Prior to Visit  Medication Dose Route Frequency Provider Last Rate Last Dose  . triamcinolone acetonide (KENALOG) 10 MG/ML injection 10 mg  10 mg Other Once [employer], DPM        No Known Allergies  Objective:  General: Alert and oriented x3 in no acute distress  Dermatology: No open lesions bilateral lower extremities, no webspace macerations, no ecchymosis bilateral, all nails x 10 are well manicured.  Vascular: Dorsalis Pedis and Posterior Tibial pedal pulses palpable, Capillary Fill Time 3 seconds,(+) pedal hair growth bilateral, no edema bilateral lower extremities, Temperature gradient within normal limits.  Neurology: Johney Maine sensation intact via light touch bilateral. (- )Tinels sign bilateral.   Musculoskeletal: Decreased tenderness with palpation at peroneal tendon at 5th met base worse with stressing midtarsal joint left foot,No pain with calf compression bilateral. There is decreased ankle rom with knee extending  vs flexed resembling gastroc equnius bilateral, Subtalar joint range of motion is within normal limits, there is no 1st ray hypermobility noted bilateral, Pes planus foot type. Strength within normal limits in all groups bilateral.    Assessment and Plan: Problem List Items Addressed This Visit    None    Visit Diagnoses    Tendonitis    -  Primary   Relevant Medications   triamcinolone acetonide (KENALOG-40) injection 20 mg   Pes planus of both feet       Foot pain, bilateral         -Complete examination performed -Previous Xrays reviewed -Discussed Continued care for tendonitis secondary to foot type -After oral consent and aseptic prep,  injected a mixture containing 1 ml of 2%  plain lidocaine, 1 ml 0.5% plain marcaine, 0.5 ml of kenalog 40 and 0.5 ml of dexamethasone phosphate into Left peroneal tendon sheath without complication. Post-injection care discussed with patient.  -Continue with left fascial brace strap from medial to lateral and slowly wean from use once symptoms are better -Recommend good supportive shoes and inserts for foot type and dispense padding and instructed on use -Recommend icing 1-2x daily  -Patient to return to office as needed or sooner if condition worsens.  Landis Martins, DPM

## 2016-05-13 ENCOUNTER — Other Ambulatory Visit: Payer: Self-pay | Admitting: Internal Medicine

## 2016-06-21 ENCOUNTER — Ambulatory Visit: Payer: Self-pay | Admitting: Physician Assistant

## 2016-06-21 ENCOUNTER — Telehealth: Payer: Self-pay | Admitting: Internal Medicine

## 2016-06-21 ENCOUNTER — Encounter: Payer: Self-pay | Admitting: Physician Assistant

## 2016-06-21 VITALS — BP 150/100 | HR 88 | Temp 99.3°F

## 2016-06-21 DIAGNOSIS — J111 Influenza due to unidentified influenza virus with other respiratory manifestations: Secondary | ICD-10-CM

## 2016-06-21 DIAGNOSIS — R509 Fever, unspecified: Secondary | ICD-10-CM

## 2016-06-21 LAB — POCT INFLUENZA A/B
INFLUENZA A, POC: NEGATIVE
INFLUENZA B, POC: NEGATIVE

## 2016-06-21 MED ORDER — OSELTAMIVIR PHOSPHATE 75 MG PO CAPS: 75.0000 mg | ORAL_CAPSULE | Freq: Two times a day (BID) | ORAL | 0 refills | Status: DC

## 2016-06-21 NOTE — Telephone Encounter (Signed)
Reason for call: ? Flu Symptoms:sore throat, cough non productive , fever 99, body aches,  Duration yesterday Medications: Patient has appointment with employee health this afternoon to be evaluated , she will keep appointment there.  works in [employer] office , had flu shot injection

## 2016-06-21 NOTE — Progress Notes (Signed)
S: C/o runny nose and congestion with dry cough for 1 days, + fever, chills, denies cp/sob, v/d; mucus was clear this am, cough is sporadic, no v/d, some body aches, works Engineer, petroleum at [employer] meds: robitussin  O: PE: vitals wnl, nad,  perrl eomi, normocephalic, tms dull, nasal mucosa red and swollen, throat injected, neck supple no lymph, lungs c t a, cv rrr, neuro intact, flu swab neg  A:  Acute flu like illness   P: drink fluids, continue regular meds , use otc meds of choice, return if not improving in 5 days, return earlier if worsening , due to pt's exposure and sx , sent rx for tamiflu to pharmacy, work note given

## 2016-06-21 NOTE — Telephone Encounter (Signed)
Pt called c/o sore throat, body aches, and headaches. No available appts today, offered STC - refused. Please advise, thank you!   Call pt @ [phone]

## 2016-08-06 ENCOUNTER — Other Ambulatory Visit: Payer: Self-pay | Admitting: Internal Medicine

## 2016-08-06 NOTE — Telephone Encounter (Signed)
Refilled on 01/30/2016 with 5 refills. Last office visit 01/30/2016. Pt does not have a follow up appt scheduled. Please advise.

## 2016-08-07 ENCOUNTER — Other Ambulatory Visit: Payer: Self-pay | Admitting: Internal Medicine

## 2016-08-07 NOTE — Telephone Encounter (Signed)
Refill for 30 days only.  OFFICE VISIT NEEDED prior to any more refills. Please schedule appt

## 2016-08-08 NOTE — Telephone Encounter (Signed)
Rx has been printed and faxed. Pt is aware and scheduled a follow up appt for 09/09/2016 with Dr. Derrel Nip.

## 2016-09-09 ENCOUNTER — Encounter: Payer: Self-pay | Admitting: Internal Medicine

## 2016-09-09 ENCOUNTER — Ambulatory Visit (INDEPENDENT_AMBULATORY_CARE_PROVIDER_SITE_OTHER): Payer: 59 | Admitting: Internal Medicine

## 2016-09-09 VITALS — BP 142/98 | HR 78 | Temp 98.1°F | Resp 16 | Wt 275.5 lb

## 2016-09-09 DIAGNOSIS — Z6841 Body Mass Index (BMI) 40.0 and over, adult: Secondary | ICD-10-CM | POA: Diagnosis not present

## 2016-09-09 DIAGNOSIS — E161 Other hypoglycemia: Secondary | ICD-10-CM | POA: Diagnosis not present

## 2016-09-09 DIAGNOSIS — F5101 Primary insomnia: Secondary | ICD-10-CM

## 2016-09-09 DIAGNOSIS — R0683 Snoring: Secondary | ICD-10-CM | POA: Diagnosis not present

## 2016-09-09 DIAGNOSIS — M7672 Peroneal tendinitis, left leg: Secondary | ICD-10-CM | POA: Diagnosis not present

## 2016-09-09 DIAGNOSIS — I10 Essential (primary) hypertension: Secondary | ICD-10-CM

## 2016-09-09 LAB — COMPREHENSIVE METABOLIC PANEL
ALBUMIN: 4.2 g/dL (ref 3.5–5.2)
ALK PHOS: 56 U/L (ref 39–117)
ALT: 26 U/L (ref 0–35)
AST: 27 U/L (ref 0–37)
BUN: 6 mg/dL (ref 6–23)
CHLORIDE: 103 meq/L (ref 96–112)
CO2: 29 mEq/L (ref 19–32)
CREATININE: 0.74 mg/dL (ref 0.40–1.20)
Calcium: 9.1 mg/dL (ref 8.4–10.5)
GFR: 109.4 mL/min (ref >60.00)
GLUCOSE: 108 mg/dL — AB (ref 70–99)
Potassium: 3.8 mEq/L (ref 3.5–5.1)
SODIUM: 138 meq/L (ref 135–145)
TOTAL PROTEIN: 7.4 g/dL (ref 6.0–8.3)
Total Bilirubin: 0.5 mg/dL (ref 0.2–1.2)

## 2016-09-09 LAB — HEMOGLOBIN A1C: HEMOGLOBIN A1C: 5.5 % (ref 4.6–6.5)

## 2016-09-09 MED ORDER — LOSARTAN POTASSIUM-HCTZ 50-12.5 MG PO TABS: 2.0000 | ORAL_TABLET | Freq: Every day | ORAL | 5 refills | Status: DC

## 2016-09-09 NOTE — Patient Instructions (Addendum)
Your blood pressure is UP today.  Please increase your losartan to 2 daily in the morning   Sleep study has been ordered to make sure that sleep apnea is not present and aggravating your blood pressure  I want you to lose 12 lbs over the next 3 months throught ditary changes and exercise   Breakfast options: (OATMEAL IS NOT HELPING YOU)  2 eggs any way,  With whole wheat toast (sugar free jelly )  Or   Danton Clap now makes a frozen breakfast frittata that can be microwaved in 2 minutes and is very low carb. Frittatas are similar to quiches without the crust  OR  Try a premixed protein drink called Premier Protein shake for breakfast or late night snack . It is great tasting,   very low sugar and available of < $2 serving at [facility] and  In bulk for $1.50/serving at [employer] and [employer]  .   Nutritional analysis :  160 cal  30 g protein  1 g sugar 50% calcium needs   [employer] and [employer]   BE ACTIVE FOR 30 MINUTES 5 DAYS PER WEEK    RETURN 3 MONTHS

## 2016-09-09 NOTE — Progress Notes (Signed)
Subjective:  Patient ID: Melanie Chavez, female    DOB: May 12, 1971  Age: 46 y.o. MRN: [id number]  CC: The primary encounter diagnosis was Essential hypertension. Diagnoses of Snoring, Morbid obesity ([place]), Reactive hypoglycemia, Peroneal tendonitis of left lower extremity, Morbid obesity with BMI of 40.0-44.9, adult ([place]), and Primary insomnia were also pertinent to this visit.  HPI SHONTE BEUTLER presents for follow up on hypertension,  chronic Insomnia  and morbid obesity  Patient is taking her medications as prescribed and notes no adverse effects.  Home BP readings have been done about once per week and are  generally  > 130/80   Declined sleep study several  years ago desite history of snoring .    Not losing weight,  DUE TO PERSISTENT orthopedic issues.  She continues to suffer from  left lateral foot pain diagnosed by podiatry as  peroneal tenodnitiits. Still wearing ankle brace.  Using motrin 3/7 days  Personal best in terms of managing her weight was a nadir of  246 lbs in May 2014 .  Her mother does a lot of the meal preparation,  Diet discussed today and referral to Dr. Leafy Ro offered, but deferred at this tie due to cost   dtr to be 16 in June  Good student.  driving test tomorrow   Outpatient Medications Prior to Visit  Medication Sig Dispense Refill  . LARIN FE 1.5/30 1.5-30 MG-MCG tablet TAKE 1 TABLET BY MOUTH DAILY. 84 tablet 3  . metoprolol succinate (TOPROL-XL) 50 MG 24 hr tablet TAKE 1 TABLET BY MOUTH DAILY. TAKE WITH OR IMMEDIATELY FOLLOWING A MEAL. 90 tablet 2  . zolpidem (AMBIEN) 5 MG tablet TAKE 1 TABLET BY MOUTH NIGHTLY AT BEDTIME AS NEEDED 30 tablet 0  . losartan-hydrochlorothiazide (HYZAAR) 50-12.5 MG tablet TAKE 1 TABLET BY MOUTH DAILY. 90 tablet 2  . diclofenac (VOLTAREN) 75 MG EC tablet Take 1 tablet (75 mg total) by mouth 2 (two) times daily. (Patient not taking: Reported on 09/09/2016) 30 tablet 0  . fluticasone (FLONASE) 50 MCG/ACT nasal spray Place 2  sprays into both nostrils daily. (Patient not taking: Reported on 09/09/2016) 16 g 6  . losartan-hydrochlorothiazide (HYZAAR) 100-12.5 MG tablet Take 1 tablet by mouth daily. (Patient not taking: Reported on 09/09/2016) 90 tablet 3  . methylPREDNISolone (MEDROL DOSEPAK) 4 MG TBPK tablet Take first as instructed (Patient not taking: Reported on 06/21/2016) 21 tablet 0  . nystatin ointment (MYCOSTATIN) Apply topically 2 (two) times daily. Until rash resolves (Patient not taking: Reported on 09/09/2016) 30 g 1  . oseltamivir (TAMIFLU) 75 MG capsule Take 1 capsule (75 mg total) by mouth 2 (two) times daily. (Patient not taking: Reported on 09/09/2016) 14 capsule 0  . sulfacetamide (BLEPH-10) 10 % ophthalmic solution Place 1 drop into the left eye every 4 (four) hours. X 5 days (Patient not taking: Reported on 09/09/2016) 15 mL 0   Facility-Administered Medications Prior to Visit  Medication Dose Route Frequency Provider Last Rate Last Dose  . triamcinolone acetonide (KENALOG) 10 MG/ML injection 10 mg  10 mg Other Once [employer], DPM      . triamcinolone acetonide (KENALOG-40) injection 20 mg  20 mg Other Once Landis Martins, DPM        Review of Systems;  Patient denies headache, fevers, malaise, unintentional weight loss, skin rash, eye pain, sinus congestion and sinus pain, sore throat, dysphagia,  hemoptysis , cough, dyspnea, wheezing, chest pain, palpitations, orthopnea, edema, abdominal pain, nausea, melena, diarrhea, constipation, flank  pain, dysuria, hematuria, urinary  Frequency, nocturia, numbness, tingling, seizures,  Focal weakness, Loss of consciousness,  Tremor, insomnia, depression, anxiety, and suicidal ideation.      Objective:  BP (!) 142/98 (BP Location: Right Arm, Patient Position: Sitting, Cuff Size: Large)   Pulse 78   Temp 98.1 F (36.7 C) (Oral)   Resp 16   Wt 275 lb 8 oz (125 kg)   SpO2 98%   BMI 45.15 kg/m   BP Readings from Last 3 Encounters:  09/09/16 (!) 142/98    06/21/16 (!) 150/100  01/30/16 140/90    Wt Readings from Last 3 Encounters:  09/09/16 275 lb 8 oz (125 kg)  01/30/16 277 lb 8 oz (125.9 kg)  11/28/15 274 lb 12.8 oz (124.6 kg)    General appearance: alert, cooperative and appears stated age Ears: normal TM's and external ear canals both ears Throat: lips, mucosa, and tongue normal; teeth and gums normal Neck: no adenopathy, no carotid bruit, supple, symmetrical, trachea midline and thyroid not enlarged, symmetric, no tenderness/mass/nodules Back: symmetric, no curvature. ROM normal. No CVA tenderness. Lungs: clear to auscultation bilaterally Heart: regular rate and rhythm, S1, S2 normal, no murmur, click, rub or gallop Abdomen: soft, non-tender; bowel sounds normal; no masses,  no organomegaly Pulses: 2+ and symmetric Skin: Skin color, texture, turgor normal. No rashes or lesions Lymph nodes: Cervical, supraclavicular, and axillary nodes normal.  Lab Results  Component Value Date   HGBA1C 5.5 09/09/2016   HGBA1C 5.6 01/30/2016   HGBA1C 5.6 08/07/2015    Lab Results  Component Value Date   CREATININE 0.74 09/09/2016   CREATININE 0.81 01/30/2016   CREATININE 0.61 08/07/2015    Lab Results  Component Value Date   WBC 5.9 01/30/2016   HGB 13.0 01/30/2016   HCT 38.1 01/30/2016   PLT 329.0 01/30/2016   GLUCOSE 108 (H) 09/09/2016   CHOL 119 01/30/2016   TRIG 70.0 01/30/2016   HDL 47.90 01/30/2016   LDLDIRECT 54.0 08/07/2015   LDLCALC 58 01/30/2016   ALT 26 09/09/2016   AST 27 09/09/2016   NA 138 09/09/2016   K 3.8 09/09/2016   CL 103 09/09/2016   CREATININE 0.74 09/09/2016   BUN 6 09/09/2016   CO2 29 09/09/2016   TSH 1.04 08/07/2015   HGBA1C 5.5 09/09/2016   MICROALBUR <0.7 08/07/2015    No results found.  Assessment & Plan:   Problem List Items Addressed This Visit    Hypertension - Primary    Not at goal.   Increased losartan/hct to 100/125 . Labs normal . Weill consider ruling out secondary causes if  bp continues to escalate.   Lab Results  Component Value Date   CREATININE 0.74 09/09/2016   Lab Results  Component Value Date   NA 138 09/09/2016   K 3.8 09/09/2016   CL 103 09/09/2016   CO2 29 09/09/2016         Relevant Medications   losartan-hydrochlorothiazide (HYZAAR) 50-12.5 MG tablet   Other Relevant Orders   Nocturnal polysomnography   Comprehensive metabolic panel (Completed)   Insomnia    managed with ambien prn. The risks and benefits of hypnotics were reviewed with patient today including excessive sedation leading to respiratory depression,  impaired thinking/driving, and addiction.  Patient was advised to avoid concurrent use with alcohol, to use medication only as needed and not to share with others  .           Morbid obesity with BMI of 40.0-44.9, adult (  [place])    I have again addressed  BMI and recommended a low glycemic index diet utilizing smaller more frequent meals to increase metabolism.  I have also recommended that patient start exercising with a goal of 30 minutes of aerobic exercise a minimum of 5 days per week. Screening for lipid disorders, thyroid and diabetes is done annually.      Peroneal tendonitis of left lower extremity    Encouraged to find an exercise that she can continue in spite of her tendonitis.       Reactive hypoglycemia     Secondary to carbohydrate rich diet,  Now modified   .  She has no signs of DM by A1c  Lab Results  Component Value Date   HGBA1C 5.5 09/09/2016            Snoring    Sleep study declined last year,  Suggested /recommended again today       Relevant Orders   Nocturnal polysomnography    Other Visit Diagnoses    Morbid obesity ([place])       Relevant Orders   Nocturnal polysomnography   Hemoglobin A1c (Completed)     A total of 25 minutes of face to face time was spent with patient more than half of which was spent in counselling about the above mentioned conditions  and coordination of care    I have changed Ms. Lupton's losartan-hydrochlorothiazide. I am also having her maintain her nystatin ointment, sulfacetamide, metoprolol succinate, fluticasone, methylPREDNISolone, diclofenac, losartan-hydrochlorothiazide, LARIN FE 1.5/30, oseltamivir, and zolpidem. We will continue to administer triamcinolone acetonide and triamcinolone acetonide.  Meds ordered this encounter  Medications  . losartan-hydrochlorothiazide (HYZAAR) 50-12.5 MG tablet    Sig: Take 2 tablets by mouth daily.    Dispense:  60 tablet    Refill:  5    NOTE DOSE INCREASE TO 2 DAILY    Medications Discontinued During This Encounter  Medication Reason  . losartan-hydrochlorothiazide (HYZAAR) 50-12.5 MG tablet Reorder    Follow-up: Return in about 3 months (around 12/09/2016).   [name], MD

## 2016-09-09 NOTE — Progress Notes (Signed)
Pre visit review using our clinic review tool, if applicable. No additional management support is needed unless otherwise documented below in the visit note. 

## 2016-09-10 NOTE — Assessment & Plan Note (Signed)
Secondary to carbohydrate rich diet,  Now modified   .  She has no signs of DM by A1c  Lab Results  Component Value Date   HGBA1C 5.5 09/09/2016

## 2016-09-10 NOTE — Assessment & Plan Note (Signed)
managed with ambien prn. The risks and benefits of hypnotics were reviewed with patient today including excessive sedation leading to respiratory depression,  impaired thinking/driving, and addiction.  Patient was advised to avoid concurrent use with alcohol, to use medication only as needed and not to share with others  .      

## 2016-09-10 NOTE — Assessment & Plan Note (Signed)
Encouraged to find an exercise that she can continue in spite of her tendonitis.

## 2016-09-10 NOTE — Assessment & Plan Note (Addendum)
Not at goal.   Increased losartan/hct to 100/125 . Labs normal . Weill consider ruling out secondary causes if bp continues to escalate.   Lab Results  Component Value Date   CREATININE 0.74 09/09/2016   Lab Results  Component Value Date   NA 138 09/09/2016   K 3.8 09/09/2016   CL 103 09/09/2016   CO2 29 09/09/2016

## 2016-09-10 NOTE — Assessment & Plan Note (Signed)
Sleep study declined last year,  Suggested /recommended again today  

## 2016-09-10 NOTE — Assessment & Plan Note (Addendum)
I have again addressed  BMI and recommended a low glycemic index diet utilizing smaller more frequent meals to increase metabolism.  I have also recommended that patient start exercising with a goal of 30 minutes of aerobic exercise a minimum of 5 days per week. Screening for lipid disorders, thyroid and diabetes is done annually.

## 2016-09-11 ENCOUNTER — Encounter: Payer: Self-pay | Admitting: Internal Medicine

## 2016-09-13 ENCOUNTER — Other Ambulatory Visit: Payer: Self-pay | Admitting: Internal Medicine

## 2016-09-13 MED ORDER — METOPROLOL SUCCINATE ER 50 MG PO TB24: ORAL_TABLET | ORAL | 0 refills | Status: DC

## 2016-09-13 NOTE — Telephone Encounter (Signed)
Refilled: 08/07/2016 Last OV: 09/09/2016 Next OV: not scheduled.

## 2016-09-13 NOTE — Telephone Encounter (Signed)
PT called requesting this medication along with her metoprolol succinate (TOPROL-XL) 50 MG 24 hr tablet. Please advise, thank you!  Pharmacy - [place], [place]

## 2016-09-13 NOTE — Telephone Encounter (Signed)
Pt called to follow up on medication listed. Thank you!

## 2016-09-15 ENCOUNTER — Telehealth: Payer: Self-pay | Admitting: Internal Medicine

## 2016-09-15 NOTE — Telephone Encounter (Signed)
zolpidem refill authorized,  Please print

## 2016-09-16 MED ORDER — ZOLPIDEM TARTRATE 5 MG PO TABS: ORAL_TABLET | ORAL | 5 refills | Status: DC

## 2016-09-16 NOTE — Telephone Encounter (Signed)
Printed and placed in quick sign folder 

## 2016-09-16 NOTE — Telephone Encounter (Signed)
Signed and faxed

## 2017-02-28 ENCOUNTER — Other Ambulatory Visit: Payer: Self-pay | Admitting: Internal Medicine

## 2017-03-05 NOTE — Telephone Encounter (Signed)
Error

## 2017-03-10 ENCOUNTER — Other Ambulatory Visit: Payer: Self-pay | Admitting: Internal Medicine

## 2017-03-19 ENCOUNTER — Encounter: Payer: Self-pay | Admitting: Internal Medicine

## 2017-03-19 ENCOUNTER — Ambulatory Visit (INDEPENDENT_AMBULATORY_CARE_PROVIDER_SITE_OTHER): Payer: 59 | Admitting: Internal Medicine

## 2017-03-19 VITALS — BP 156/94 | HR 69 | Temp 98.4°F | Resp 15 | Ht 65.5 in | Wt 271.8 lb

## 2017-03-19 DIAGNOSIS — F5101 Primary insomnia: Secondary | ICD-10-CM | POA: Diagnosis not present

## 2017-03-19 DIAGNOSIS — I10 Essential (primary) hypertension: Secondary | ICD-10-CM

## 2017-03-19 DIAGNOSIS — Z1231 Encounter for screening mammogram for malignant neoplasm of breast: Secondary | ICD-10-CM

## 2017-03-19 DIAGNOSIS — Z6841 Body Mass Index (BMI) 40.0 and over, adult: Secondary | ICD-10-CM

## 2017-03-19 DIAGNOSIS — R0683 Snoring: Secondary | ICD-10-CM | POA: Diagnosis not present

## 2017-03-19 DIAGNOSIS — Z1239 Encounter for other screening for malignant neoplasm of breast: Secondary | ICD-10-CM

## 2017-03-19 LAB — POCT URINE PREGNANCY: PREG TEST UR: NEGATIVE

## 2017-03-19 MED ORDER — ZOLPIDEM TARTRATE 5 MG PO TABS: ORAL_TABLET | ORAL | 5 refills | Status: DC

## 2017-03-19 NOTE — Patient Instructions (Signed)
Please get your blood pressure checked once or twice per month and send me the readings   Mammogram ordered   I'm glad you are still losing weight!!    See you in 6 months

## 2017-03-19 NOTE — Progress Notes (Signed)
Subjective:  Patient ID: Melanie Chavez, female    DOB: 05/03/1971  Age: 46 y.o. MRN: [id number]  CC: The primary encounter diagnosis was Essential hypertension. Diagnoses of Breast cancer screening, White coat syndrome with hypertension, Morbid obesity with BMI of 40.0-44.9, adult ([place]), Primary insomnia, and Snoring were also pertinent to this visit.  HPI Melanie Chavez presents for medication refill and follow up on hypertension, obesity  and insomnia.    Patient is taking her medications as prescribed and notes no adverse effects.  Home BP readings have  been done  Periodically and are at goal .  She is avoiding added salt in her diet and dancing to an exercise DC at night at home with her teenage daughter [facility] for exercise.   She has been Working longer days because she has added a part time job for  2 hours daily at the [employer].   Away from home from 7AM  to 7:30 PM  Home BP was 120/81   She is no longer taking OCPs to regulate periods  Skipping every other month . Denies hot flashes.  Not sexually active,  Does not smoke.     Lab Results  Component Value Date   HGBA1C 5.5 09/09/2016     Outpatient Medications Prior to Visit  Medication Sig Dispense Refill  . losartan-hydrochlorothiazide (HYZAAR) 100-12.5 MG tablet TAKE 1 TABLET BY MOUTH DAILY. 90 tablet 3  . metoprolol succinate (TOPROL-XL) 50 MG 24 hr tablet TAKE 1 TABLET BY MOUTH DAILY. TAKE WITH OR IMMEDIATELY FOLLOWING A MEAL. 90 tablet 0  . zolpidem (AMBIEN) 5 MG tablet TAKE 1 TABLET BY MOUTH NIGHTLY AT BEDTIME AS NEEDED 30 tablet 0  . diclofenac (VOLTAREN) 75 MG EC tablet Take 1 tablet (75 mg total) by mouth 2 (two) times daily. (Patient not taking: Reported on 09/09/2016) 30 tablet 0  . fluticasone (FLONASE) 50 MCG/ACT nasal spray Place 2 sprays into both nostrils daily. (Patient not taking: Reported on 09/09/2016) 16 g 6  . LARIN FE 1.5/30 1.5-30 MG-MCG tablet TAKE 1 TABLET BY MOUTH DAILY.  (Patient not taking: Reported on 03/19/2017) 84 tablet 3  . losartan-hydrochlorothiazide (HYZAAR) 50-12.5 MG tablet Take 2 tablets by mouth daily. (Patient not taking: Reported on 03/19/2017) 60 tablet 5  . methylPREDNISolone (MEDROL DOSEPAK) 4 MG TBPK tablet Take first as instructed (Patient not taking: Reported on 06/21/2016) 21 tablet 0  . metoprolol succinate (TOPROL-XL) 50 MG 24 hr tablet TAKE 1 TABLET BY MOUTH DAILY. TAKE WITH OR IMMEDIATELY FOLLOWING A MEAL. (Patient not taking: Reported on 03/19/2017) 90 tablet 0  . nystatin ointment (MYCOSTATIN) Apply topically 2 (two) times daily. Until rash resolves (Patient not taking: Reported on 09/09/2016) 30 g 1  . oseltamivir (TAMIFLU) 75 MG capsule Take 1 capsule (75 mg total) by mouth 2 (two) times daily. (Patient not taking: Reported on 09/09/2016) 14 capsule 0  . sulfacetamide (BLEPH-10) 10 % ophthalmic solution Place 1 drop into the left eye every 4 (four) hours. X 5 days (Patient not taking: Reported on 09/09/2016) 15 mL 0   Facility-Administered Medications Prior to Visit  Medication Dose Route Frequency Provider Last Rate Last Dose  . triamcinolone acetonide (KENALOG) 10 MG/ML injection 10 mg  10 mg Other Once [employer], Titorya, DPM      . triamcinolone acetonide (KENALOG-40) injection 20 mg  20 mg Other Once Landis Martins, DPM        Review of Systems;  Patient denies headache, fevers, malaise, unintentional  weight loss, skin rash, eye pain, sinus congestion and sinus pain, sore throat, dysphagia,  hemoptysis , cough, dyspnea, wheezing, chest pain, palpitations, orthopnea, edema, abdominal pain, nausea, melena, diarrhea, constipation, flank pain, dysuria, hematuria, urinary  Frequency, nocturia, numbness, tingling, seizures,  Focal weakness, Loss of consciousness,  Tremor, insomnia, depression, anxiety, and suicidal ideation.      Objective:  BP (!) 156/94 (BP Location: Left Arm, Patient Position: Sitting, Cuff Size: Large)   Pulse 69   Temp  98.4 F (36.9 C) (Oral)   Resp 15   Ht 5' 5.5" (1.664 m)   Wt 271 lb 12.8 oz (123.3 kg)   SpO2 97%   BMI 44.54 kg/m   BP Readings from Last 3 Encounters:  03/19/17 (!) 156/94  09/09/16 (!) 142/98  06/21/16 (!) 150/100    Wt Readings from Last 3 Encounters:  03/19/17 271 lb 12.8 oz (123.3 kg)  09/09/16 275 lb 8 oz (125 kg)  01/30/16 277 lb 8 oz (125.9 kg)    General appearance: alert, cooperative and appears stated age Ears: normal TM's and external ear canals both ears Throat: lips, mucosa, and tongue normal; teeth and gums normal Neck: no adenopathy, no carotid bruit, supple, symmetrical, trachea midline and thyroid not enlarged, symmetric, no tenderness/mass/nodules Back: symmetric, no curvature. ROM normal. No CVA tenderness. Lungs: clear to auscultation bilaterally Heart: regular rate and rhythm, S1, S2 normal, no murmur, click, rub or gallop Abdomen: soft, non-tender; bowel sounds normal; no masses,  no organomegaly Pulses: 2+ and symmetric Skin: Skin color, texture, turgor normal. No rashes or lesions Lymph nodes: Cervical, supraclavicular, and axillary nodes normal.  Lab Results  Component Value Date   HGBA1C 5.5 09/09/2016   HGBA1C 5.6 01/30/2016   HGBA1C 5.6 08/07/2015    Lab Results  Component Value Date   CREATININE 0.92 03/19/2017   CREATININE 0.74 09/09/2016   CREATININE 0.81 01/30/2016    Lab Results  Component Value Date   WBC 5.9 01/30/2016   HGB 13.0 01/30/2016   HCT 38.1 01/30/2016   PLT 329.0 01/30/2016   GLUCOSE 80 03/19/2017   CHOL 123 03/19/2017   TRIG 103.0 03/19/2017   HDL 44.50 03/19/2017   LDLDIRECT 54.0 08/07/2015   LDLCALC 58 03/19/2017   ALT 16 03/19/2017   AST 19 03/19/2017   NA 138 03/19/2017   K 3.8 03/19/2017   CL 101 03/19/2017   CREATININE 0.92 03/19/2017   BUN 11 03/19/2017   CO2 29 03/19/2017   TSH 1.04 08/07/2015   HGBA1C 5.5 09/09/2016   MICROALBUR 5.4 (H) 03/19/2017    No results found.  Assessment &  Plan:   Problem List Items Addressed This Visit    Insomnia    managed with ambien prn. The risks and benefits of hypnotics were reviewed with patient today including excessive sedation leading to respiratory depression,  impaired thinking/driving, and addiction.  Patient was advised to avoid concurrent use with alcohol, to use medication only as needed and not to share with others  .           Morbid obesity with BMI of 40.0-44.9, adult ([place])    I have reviewed   BMI and reinforced need to follow a low glycemic index diet utilizing smaller more frequent meals to increase metabolism.  I have also recommended that patient increase her exercising with a goal of 30 minutes of aerobic exercise a minimum of 5 days per week. Screening for lipid disorders, thyroid and diabetes is done annually.  Snoring    Sleep study declined last year,  Suggested /recommended again today       White coat syndrome with hypertension - Primary    Her BP is not at goal currently but home readings are much lower by report.  Medications reviewed and compliance assessed via patient report.  Renal function and electolytes assessed . New onset proteinuria noted ; continue losartan/hct and metoprolol,  Adding 2.5 mg amlodipine.   Use of NSAIDs, oral decongestants and other medications/supplements reviewed  .  Lab Results  Component Value Date   MICROALBUR 5.4 (H) 03/19/2017   Lab Results  Component Value Date   CREATININE 0.92 03/19/2017   Lab Results  Component Value Date   NA 138 03/19/2017   K 3.8 03/19/2017   CL 101 03/19/2017   CO2 29 03/19/2017         Relevant Medications   amLODipine (NORVASC) 2.5 MG tablet    Other Visit Diagnoses    Breast cancer screening       Relevant Orders   MM SCREENING BREAST TOMO BILATERAL   POCT urine pregnancy (Completed)      I have discontinued Ms. Catalfamo's nystatin ointment, sulfacetamide, fluticasone, methylPREDNISolone, diclofenac, LARIN FE 1.5/30,  and oseltamivir. I am also having her start on amLODipine. Additionally, I am having her maintain her losartan-hydrochlorothiazide, metoprolol succinate, and zolpidem. We will continue to administer triamcinolone acetonide and triamcinolone acetonide.  Meds ordered this encounter  Medications  . DISCONTD: zolpidem (AMBIEN) 5 MG tablet    Sig: TAKE 1 TABLET BY MOUTH NIGHTLY AT BEDTIME AS NEEDED    Dispense:  30 tablet    Refill:  5    Needs a followup appt with PCP  . zolpidem (AMBIEN) 5 MG tablet    Sig: TAKE 1 TABLET BY MOUTH NIGHTLY AT BEDTIME AS NEEDED    Dispense:  30 tablet    Refill:  5  . amLODipine (NORVASC) 2.5 MG tablet    Sig: Take 1 tablet (2.5 mg total) by mouth daily.    Dispense:  90 tablet    Refill:  3    Medications Discontinued During This Encounter  Medication Reason  . diclofenac (VOLTAREN) 75 MG EC tablet Patient has not taken in last 30 days  . fluticasone (FLONASE) 50 MCG/ACT nasal spray Patient has not taken in last 30 days  . LARIN FE 1.5/30 1.5-30 MG-MCG tablet Patient has not taken in last 30 days  . losartan-hydrochlorothiazide (HYZAAR) 50-12.5 MG tablet Patient has not taken in last 30 days  . methylPREDNISolone (MEDROL DOSEPAK) 4 MG TBPK tablet Patient has not taken in last 30 days  . metoprolol succinate (TOPROL-XL) 50 MG 24 hr tablet Duplicate  . nystatin ointment (MYCOSTATIN) Patient has not taken in last 30 days  . oseltamivir (TAMIFLU) 75 MG capsule Patient has not taken in last 30 days  . sulfacetamide (BLEPH-10) 10 % ophthalmic solution Patient has not taken in last 30 days  . zolpidem (AMBIEN) 5 MG tablet Reorder  . zolpidem (AMBIEN) 5 MG tablet     Follow-up: Return in about 6 months (around 09/17/2017) for CPE with PAP smear.   [name], MD

## 2017-03-20 LAB — COMPREHENSIVE METABOLIC PANEL
ALBUMIN: 4.3 g/dL (ref 3.5–5.2)
ALT: 16 U/L (ref 0–35)
AST: 19 U/L (ref 0–37)
Alkaline Phosphatase: 56 U/L (ref 39–117)
BUN: 11 mg/dL (ref 6–23)
CHLORIDE: 101 meq/L (ref 96–112)
CO2: 29 mEq/L (ref 19–32)
CREATININE: 0.92 mg/dL (ref 0.40–1.20)
Calcium: 9.4 mg/dL (ref 8.4–10.5)
GFR: 84.89 mL/min (ref >60.00)
GLUCOSE: 80 mg/dL (ref 70–99)
Potassium: 3.8 mEq/L (ref 3.5–5.1)
SODIUM: 138 meq/L (ref 135–145)
TOTAL PROTEIN: 7.6 g/dL (ref 6.0–8.3)
Total Bilirubin: 0.4 mg/dL (ref 0.2–1.2)

## 2017-03-20 LAB — MICROALBUMIN / CREATININE URINE RATIO
Creatinine,U: 110.2 mg/dL
MICROALB UR: 5.4 mg/dL — AB (ref 0.0–1.9)
MICROALB/CREAT RATIO: 4.9 mg/g (ref 0.0–30.0)

## 2017-03-20 LAB — LIPID PANEL
CHOLESTEROL: 123 mg/dL (ref 0–200)
HDL: 44.5 mg/dL (ref >39.00)
LDL CALC: 58 mg/dL (ref 0–99)
NONHDL: 78.66
Total CHOL/HDL Ratio: 3
Triglycerides: 103 mg/dL (ref 0.0–149.0)
VLDL: 20.6 mg/dL (ref 0.0–40.0)

## 2017-03-21 ENCOUNTER — Telehealth: Payer: Self-pay | Admitting: Internal Medicine

## 2017-03-21 MED ORDER — AMLODIPINE BESYLATE 2.5 MG PO TABS: 2.5000 mg | ORAL_TABLET | Freq: Every day | ORAL | 3 refills | Status: DC

## 2017-03-21 NOTE — Telephone Encounter (Signed)
Amlodipine added  ,  mychart message sent

## 2017-03-21 NOTE — Assessment & Plan Note (Addendum)
I have reviewed   BMI and reinforced need to follow a low glycemic index diet utilizing smaller more frequent meals to increase metabolism.  I have also recommended that patient increase her exercising with a goal of 30 minutes of aerobic exercise a minimum of 5 days per week.  First goal is 246 lbs,  Her weight in 2014. Screening for lipid disorders, thyroid and diabetes is done annually. 

## 2017-03-21 NOTE — Assessment & Plan Note (Signed)
managed with ambien prn. The risks and benefits of hypnotics were reviewed with patient today including excessive sedation leading to respiratory depression,  impaired thinking/driving, and addiction.  Patient was advised to avoid concurrent use with alcohol, to use medication only as needed and not to share with others  .      

## 2017-03-21 NOTE — Assessment & Plan Note (Addendum)
Her BP is not at goal currently but home readings are much lower by report.  Medications reviewed and compliance assessed via patient report.  Renal function and electolytes assessed . New onset proteinuria noted ; continue losartan/hct and metoprolol,  Adding 2.5 mg amlodipine.   Use of NSAIDs, oral decongestants and other medications/supplements reviewed  .  Lab Results  Component Value Date   MICROALBUR 5.4 (H) 03/19/2017   Lab Results  Component Value Date   CREATININE 0.92 03/19/2017   Lab Results  Component Value Date   NA 138 03/19/2017   K 3.8 03/19/2017   CL 101 03/19/2017   CO2 29 03/19/2017

## 2017-03-21 NOTE — Assessment & Plan Note (Signed)
Sleep study declined last year,  Suggested /recommended again today

## 2017-04-11 ENCOUNTER — Encounter: Payer: Self-pay | Admitting: Internal Medicine

## 2017-04-30 ENCOUNTER — Ambulatory Visit
Admission: RE | Admit: 2017-04-30 | Discharge: 2017-04-30 | Disposition: A | Discharge status: Home / Self Care | Payer: 59 | Source: Ambulatory Visit | Attending: Internal Medicine | Admitting: Internal Medicine

## 2017-04-30 DIAGNOSIS — Z1231 Encounter for screening mammogram for malignant neoplasm of breast: Secondary | ICD-10-CM | POA: Insufficient documentation

## 2017-04-30 DIAGNOSIS — Z1239 Encounter for other screening for malignant neoplasm of breast: Secondary | ICD-10-CM

## 2017-07-10 ENCOUNTER — Other Ambulatory Visit: Payer: Self-pay | Admitting: Internal Medicine

## 2017-10-03 ENCOUNTER — Other Ambulatory Visit: Payer: Self-pay | Admitting: Internal Medicine

## 2017-10-03 NOTE — Telephone Encounter (Signed)
Spoke with Melanie Chavez and she stated that she has enough to get her through until Dr. Derrel Nip returns.   Refilled: 03/19/2017 Last OV: 03/19/2017 Next OV: 12/03/2017

## 2017-10-03 NOTE — Telephone Encounter (Signed)
Medication was called in to [place].

## 2017-12-03 ENCOUNTER — Ambulatory Visit (INDEPENDENT_AMBULATORY_CARE_PROVIDER_SITE_OTHER): Payer: 59 | Admitting: Internal Medicine

## 2017-12-03 ENCOUNTER — Encounter: Payer: Self-pay | Admitting: Internal Medicine

## 2017-12-03 VITALS — BP 122/84 | HR 69 | Temp 98.1°F | Resp 14 | Ht 65.5 in | Wt 269.8 lb

## 2017-12-03 DIAGNOSIS — Z6841 Body Mass Index (BMI) 40.0 and over, adult: Secondary | ICD-10-CM

## 2017-12-03 DIAGNOSIS — Z0000 Encounter for general adult medical examination without abnormal findings: Secondary | ICD-10-CM | POA: Diagnosis not present

## 2017-12-03 DIAGNOSIS — I10 Essential (primary) hypertension: Secondary | ICD-10-CM

## 2017-12-03 DIAGNOSIS — R5383 Other fatigue: Secondary | ICD-10-CM | POA: Diagnosis not present

## 2017-12-03 DIAGNOSIS — F5101 Primary insomnia: Secondary | ICD-10-CM | POA: Diagnosis not present

## 2017-12-03 DIAGNOSIS — E161 Other hypoglycemia: Secondary | ICD-10-CM

## 2017-12-03 LAB — TSH: TSH: 1.25 u[IU]/mL (ref 0.35–4.50)

## 2017-12-03 LAB — CBC WITH DIFFERENTIAL/PLATELET
BASOS ABS: 0.1 10*3/uL (ref 0.0–0.1)
BASOS PCT: 1.3 % (ref 0.0–3.0)
EOS PCT: 2.5 % (ref 0.0–5.0)
Eosinophils Absolute: 0.1 10*3/uL (ref 0.0–0.7)
HEMATOCRIT: 38.2 % (ref 36.0–46.0)
Hemoglobin: 12.7 g/dL (ref 12.0–15.0)
LYMPHS PCT: 39.8 % (ref 12.0–46.0)
Lymphs Abs: 1.9 10*3/uL (ref 0.7–4.0)
MCHC: 33.3 g/dL (ref 30.0–36.0)
MCV: 90.2 fl (ref 78.0–100.0)
Monocytes Absolute: 0.5 10*3/uL (ref 0.1–1.0)
Monocytes Relative: 11.1 % (ref 3.0–12.0)
NEUTROS ABS: 2.2 10*3/uL (ref 1.4–7.7)
Neutrophils Relative %: 45.3 % (ref 43.0–77.0)
Platelets: 400 10*3/uL (ref 150.0–400.0)
RBC: 4.24 Mil/uL (ref 3.87–5.11)
RDW: 13.7 % (ref 11.5–15.5)
WBC: 4.8 10*3/uL (ref 4.0–10.5)

## 2017-12-03 LAB — COMPREHENSIVE METABOLIC PANEL
ALT: 17 U/L (ref 0–35)
AST: 19 U/L (ref 0–37)
Albumin: 4.3 g/dL (ref 3.5–5.2)
Alkaline Phosphatase: 61 U/L (ref 39–117)
BUN: 12 mg/dL (ref 6–23)
CHLORIDE: 103 meq/L (ref 96–112)
CO2: 28 mEq/L (ref 19–32)
Calcium: 9.3 mg/dL (ref 8.4–10.5)
Creatinine, Ser: 0.76 mg/dL (ref 0.40–1.20)
GFR: 105.5 mL/min (ref >60.00)
Glucose, Bld: 91 mg/dL (ref 70–99)
POTASSIUM: 3.7 meq/L (ref 3.5–5.1)
SODIUM: 138 meq/L (ref 135–145)
Total Bilirubin: 0.7 mg/dL (ref 0.2–1.2)
Total Protein: 7.8 g/dL (ref 6.0–8.3)

## 2017-12-03 LAB — LIPID PANEL
CHOLESTEROL: 129 mg/dL (ref 0–200)
HDL: 53.5 mg/dL (ref >39.00)
LDL CALC: 62 mg/dL (ref 0–99)
NonHDL: 75.67
Total CHOL/HDL Ratio: 2
Triglycerides: 66 mg/dL (ref 0.0–149.0)
VLDL: 13.2 mg/dL (ref 0.0–40.0)

## 2017-12-03 LAB — HEMOGLOBIN A1C: HEMOGLOBIN A1C: 5.7 % (ref 4.6–6.5)

## 2017-12-03 MED ORDER — METOPROLOL SUCCINATE ER 50 MG PO TB24: ORAL_TABLET | ORAL | 3 refills | Status: DC

## 2017-12-03 NOTE — Progress Notes (Signed)
Patient ID: Melanie Chavez, female    DOB: Aug 20, 1970  Age: 47 y.o. MRN: [id number]  The patient is here for annual  preventive  examination and management of other chronic and acute problems.   The risk factors are reflected in the social history.  The roster of all physicians providing medical care to patient - is listed in the Snapshot section of the chart.  Activities of daily living:  The patient is 100% independent in all ADLs: dressing, toileting, feeding as well as independent mobility  Home safety : The patient has smoke detectors in the home. They wear seatbelts.  There are no firearms at home. There is no violence in the home.   There is no risks for hepatitis, STDs or HIV. There is no   history of blood transfusion. They have no travel history to infectious disease endemic areas of the world.  The patient has seen their dentist in the last six month. They have seen their eye doctor in the last year. T   They have deferred audiologic testing in the last year.  They do not  have excessive sun exposure. Discussed the need for sun protection: hats, long sleeves and use of sunscreen if there is significant sun exposure.   Diet: the importance of a healthy diet is discussed. They do have a healthy diet.  The benefits of regular aerobic exercise were discussed. Sh   eis not exercising regularly and has not lost her desired weight     Depression screen: there are minor signs/ symptoms of depression- primarily feelings of low worth/self esteem , but she denies irritability, change in appetite, anhedonia,.  She is mildly tearful during discussion.   The following portions of the patient's history were reviewed and updated as appropriate: allergies, current medications, past family history, past medical history,  past surgical history, past social history  and problem list.  Visual acuity was not assessed per patient preference since she has regular follow up with her ophthalmologist.  Hearing and body mass index were assessed and reviewed.   During the course of the visit the patient was educated and counseled about appropriate screening and preventive services including : fall prevention , diabetes screening, nutrition counseling, colorectal cancer screening, and recommended immunizations.    CC: The primary encounter diagnosis was Reactive hypoglycemia. Diagnoses of Essential hypertension, Fatigue, unspecified type, White coat syndrome with hypertension, Primary insomnia, Morbid obesity with BMI of 40.0-44.9, adult ([place]), and Visit for preventive health examination were also pertinent to this visit.   1) mild depressive symptoms:  Cites feelings of low self worth,  Despite having a happy home life with 21 yr old daughter Sherrine Maples and her aging mother. Enjoying work .  2) chronic insomnia:  Still requires use of ambien to sleep  3) Obesity:  Disappointed in herself ,  Not following a det,  Not exercising   History Aerianna has a past medical history of H/O pelvic inflammatory disease, History of recurrent miscarriages, not currently pregnant, Hypertension, Obesity (BMI 30-39.9), Plantar fasciitis, and Reactive hypoglycemia (2011).   She has a past surgical history that includes Cholecystectomy (2007) and Gallbladder surgery.   Her family history includes Birth defects in her paternal uncle; Cancer in her maternal grandmother; Diabetes in her mother; Heart disease in her father, paternal aunt, paternal grandfather, and paternal uncle.She reports that she has never smoked. She has never used smokeless tobacco. Her alcohol and drug histories are not on file.  Outpatient Medications Prior to Visit  Medication Sig Dispense Refill  . amLODipine (NORVASC) 2.5 MG tablet Take 1 tablet (2.5 mg total) by mouth daily. 90 tablet 3  . losartan-hydrochlorothiazide (HYZAAR) 100-12.5 MG tablet TAKE 1 TABLET BY MOUTH DAILY. 90 tablet 3  . zolpidem (AMBIEN) 5 MG tablet TAKE ONE TABLET BY  MOUTH EACH NIGHT AT BEDTIME AS NEEDED 30 tablet 5  . metoprolol succinate (TOPROL-XL) 50 MG 24 hr tablet TAKE 1 TABLET BY MOUTH DAILY. TAKE WITH OR IMMEDIATELY FOLLOWING A MEAL. 90 tablet 0   Facility-Administered Medications Prior to Visit  Medication Dose Route Frequency Provider Last Rate Last Dose  . triamcinolone acetonide (KENALOG) 10 MG/ML injection 10 mg  10 mg Other Once [place], Titorya, DPM      . triamcinolone acetonide (KENALOG-40) injection 20 mg  20 mg Other Once Landis Martins, DPM        Review of Systems   Patient denies headache, fevers, malaise, unintentional weight loss, skin rash, eye pain, sinus congestion and sinus pain, sore throat, dysphagia,  hemoptysis , cough, dyspnea, wheezing, chest pain, palpitations, orthopnea, edema, abdominal pain, nausea, melena, diarrhea, constipation, flank pain, dysuria, hematuria, urinary  Frequency, nocturia, numbness, tingling, seizures,  Focal weakness, Loss of consciousness,  Tremor, insomnia, depression, anxiety, and suicidal ideation.      Objective:  BP 122/84 (BP Location: Left Arm, Patient Position: Sitting, Cuff Size: Large)   Pulse 69   Temp 98.1 F (36.7 C) (Oral)   Resp 14   Ht 5' 5.5" (1.664 m)   Wt 269 lb 12.8 oz (122.4 kg)   SpO2 98%   BMI 44.21 kg/m   Physical Exam   General appearance: alert, cooperative and appears stated age Head: Normocephalic, without obvious abnormality, atraumatic Eyes: conjunctivae/corneas clear. PERRL, EOM's intact. Fundi benign. Ears: normal TM's and external ear canals both ears Nose: Nares normal. Septum midline. Mucosa normal. No drainage or sinus tenderness. Throat: lips, mucosa, and tongue normal; teeth and gums normal Neck: no adenopathy, no carotid bruit, no JVD, supple, symmetrical, trachea midline and thyroid not enlarged, symmetric, no tenderness/mass/nodules Lungs: clear to auscultation bilaterally Breasts: pendulous,   no masses or tenderness Heart: regular rate and  rhythm, S1, S2 normal, no murmur, click, rub or gallop Abdomen: soft, non-tender; bowel sounds normal; no masses,  no organomegaly Extremities: extremities normal, atraumatic, no cyanosis or edema Pulses: 2+ and symmetric Skin: Skin color, texture, turgor normal. No rashes or lesions GYN: deferred per patient request  Neurologic: Alert and oriented X 3, normal strength and tone. Normal symmetric reflexes. Normal coordination and gait.      Assessment & Plan:   Problem List Items Addressed This Visit    Reactive hypoglycemia - Primary   Relevant Orders   Hemoglobin A1c (Completed)   Morbid obesity with BMI of 40.0-44.9, adult ([place])    I have reviewed   BMI and reinforced need to follow a low glycemic index diet utilizing smaller more frequent meals to increase metabolism.  I have also recommended that patient increase her exercising with a goal of 30 minutes of aerobic exercise a minimum of 5 days per week.  First goal is 246 lbs,  Her weight in 2014. Screening for lipid disorders, thyroid and diabetes is done annually.      White coat syndrome with hypertension    Well controlled on current regimen. Renal function stable, no changes today.  Lab Results  Component Value Date   CREATININE 0.76 12/03/2017   Lab Results  Component Value Date  NA 138 12/03/2017   K 3.7 12/03/2017   CL 103 12/03/2017   CO2 28 12/03/2017         Relevant Medications   metoprolol succinate (TOPROL-XL) 50 MG 24 hr tablet   Visit for preventive health examination    Annual comprehensive preventive exam was done as well as an evaluation and management of chronic conditions .  During the course of the visit the patient was educated and counseled about appropriate screening and preventive services including :  diabetes screening, lipid analysis with projected  10 year  risk for CAD , nutrition counseling, breast, cervical and colorectal cancer screening, and recommended immunizations.  Printed  recommendations for health maintenance screenings was given      Insomnia    managed with ambien prn. The risks and benefits of hypnotics were reviewed with patient today including excessive sedation leading to respiratory depression,  impaired thinking/driving, and addiction.  Patient was advised to avoid concurrent use with alcohol, to use medication only as needed and not to share with others  .            Other Visit Diagnoses    Essential hypertension       Relevant Medications   metoprolol succinate (TOPROL-XL) 50 MG 24 hr tablet   Other Relevant Orders   Comprehensive metabolic panel (Completed)   Lipid panel (Completed)   Fatigue, unspecified type       Relevant Orders   TSH (Completed)   CBC with Differential/Platelet (Completed)      I am having Jennice L. Erskin maintain her losartan-hydrochlorothiazide, amLODipine, zolpidem, and metoprolol succinate. We will continue to administer triamcinolone acetonide and triamcinolone acetonide.  Meds ordered this encounter  Medications  . metoprolol succinate (TOPROL-XL) 50 MG 24 hr tablet    Sig: TAKE 1 TABLET BY MOUTH DAILY. TAKE WITH OR IMMEDIATELY FOLLOWING A MEAL.    Dispense:  90 tablet    Refill:  3    Medications Discontinued During This Encounter  Medication Reason  . metoprolol succinate (TOPROL-XL) 50 MG 24 hr tablet Reorder    Follow-up: Return in about 6 months (around 06/04/2018).   [name], MD

## 2017-12-03 NOTE — Patient Instructions (Addendum)
Try using metamucil In 8 ounces of water 30 minutes before your biggest meal.  It will expand I your stomach and curb your appetite   You need 15 to 30 minutes of outside time to remind f [employer] sovereignty  try to increase  Dance time to 30 minutes 3 times per week   Health Maintenance, Female Adopting a healthy lifestyle and getting preventive care can go a long way to promote health and wellness. Talk with your health care provider about what schedule of regular examinations is right for you. This is a good chance for you to check in with your provider about disease prevention and staying healthy. In between checkups, there are plenty of things you can do on your own. Experts have done a lot of research about which lifestyle changes and preventive measures are most likely to keep you healthy. Ask your health care provider for more information. Weight and diet Eat a healthy diet  Be sure to include plenty of vegetables, fruits, low-fat dairy products, and lean protein.  Do not eat a lot of foods high in solid fats, added sugars, or salt.  Get regular exercise. This is one of the most important things you can do for your health. ? Most adults should exercise for at least 150 minutes each week. The exercise should increase your heart rate and make you sweat (moderate-intensity exercise). ? Most adults should also do strengthening exercises at least twice a week. This is in addition to the moderate-intensity exercise.  Maintain a healthy weight  Body mass index (BMI) is a measurement that can be used to identify possible weight problems. It estimates body fat based on height and weight. Your health care provider can help determine your BMI and help you achieve or maintain a healthy weight.  For females 46 years of age and older: ? A BMI below 18.5 is considered underweight. ? A BMI of 18.5 to 24.9 is normal. ? A BMI of 25 to 29.9 is considered overweight. ? A BMI of 30 and above is  considered obese.  Watch levels of cholesterol and blood lipids  You should start having your blood tested for lipids and cholesterol at 46 years of age, then have this test every 5 years.  You may need to have your cholesterol levels checked more often if: ? Your lipid or cholesterol levels are high. ? You are older than 46 years of age. ? You are at high risk for heart disease.  Cancer screening Lung Cancer  Lung cancer screening is recommended for adults 69-36 years old who are at high risk for lung cancer because of a history of smoking.  A yearly low-dose CT scan of the lungs is recommended for people who: ? Currently smoke. ? Have quit within the past 15 years. ? Have at least a 30-pack-year history of smoking. A pack year is smoking an average of one pack of cigarettes a day for 1 year.  Yearly screening should continue until it has been 15 years since you quit.  Yearly screening should stop if you develop a health problem that would prevent you from having lung cancer treatment.  Breast Cancer  Practice breast self-awareness. This means understanding how your breasts normally appear and feel.  It also means doing regular breast self-exams. Let your health care provider know about any changes, no matter how small.  If you are in your 20s or 30s, you should have a clinical breast exam (CBE) by a health care provider  every 1-3 years as part of a regular health exam.  If you are 41 or older, have a CBE every year. Also consider having a breast X-ray (mammogram) every year.  If you have a family history of breast cancer, talk to your health care provider about genetic screening.  If you are at high risk for breast cancer, talk to your health care provider about having an MRI and a mammogram every year.  Breast cancer gene (BRCA) assessment is recommended for women who have family members with BRCA-related cancers. BRCA-related cancers  include: ? Breast. ? Ovarian. ? Tubal. ? Peritoneal cancers.  Results of the assessment will determine the need for genetic counseling and BRCA1 and BRCA2 testing.  Cervical Cancer Your health care provider may recommend that you be screened regularly for cancer of the pelvic organs (ovaries, uterus, and vagina). This screening involves a pelvic examination, including checking for microscopic changes to the surface of your cervix (Pap test). You may be encouraged to have this screening done every 3 years, beginning at age 65.  For women ages 44-65, health care providers may recommend pelvic exams and Pap testing every 3 years, or they may recommend the Pap and pelvic exam, combined with testing for human papilloma virus (HPV), every 5 years. Some types of HPV increase your risk of cervical cancer. Testing for HPV may also be done on women of any age with unclear Pap test results.  Other health care providers may not recommend any screening for nonpregnant women who are considered low risk for pelvic cancer and who do not have symptoms. Ask your health care provider if a screening pelvic exam is right for you.  If you have had past treatment for cervical cancer or a condition that could lead to cancer, you need Pap tests and screening for cancer for at least 20 years after your treatment. If Pap tests have been discontinued, your risk factors (such as having a new sexual partner) need to be reassessed to determine if screening should resume. Some women have medical problems that increase the chance of getting cervical cancer. In these cases, your health care provider may recommend more frequent screening and Pap tests.  Colorectal Cancer  This type of cancer can be detected and often prevented.  Routine colorectal cancer screening usually begins at 46 years of age and continues through 46 years of age.  Your health care provider may recommend screening at an earlier age if you have risk factors  for colon cancer.  Your health care provider may also recommend using home test kits to check for hidden blood in the stool.  A small camera at the end of a tube can be used to examine your colon directly (sigmoidoscopy or colonoscopy). This is done to check for the earliest forms of colorectal cancer.  Routine screening usually begins at age 19.  Direct examination of the colon should be repeated every 5-10 years through 46 years of age. However, you may need to be screened more often if early forms of precancerous polyps or small growths are found.  Skin Cancer  Check your skin from head to toe regularly.  Tell your health care provider about any new moles or changes in moles, especially if there is a change in a mole's shape or color.  Also tell your health care provider if you have a mole that is larger than the size of a pencil eraser.  Always use sunscreen. Apply sunscreen liberally and repeatedly throughout the day.  Protect yourself by wearing long sleeves, pants, a wide-brimmed hat, and sunglasses whenever you are outside.  Heart disease, diabetes, and high blood pressure  High blood pressure causes heart disease and increases the risk of stroke. High blood pressure is more likely to develop in: ? People who have blood pressure in the high end of the normal range (130-139/85-89 mm Hg). ? People who are overweight or obese. ? People who are African American.  If you are 57-39 years of age, have your blood pressure checked every 3-5 years. If you are 19 years of age or older, have your blood pressure checked every year. You should have your blood pressure measured twice-once when you are at a hospital or clinic, and once when you are not at a hospital or clinic. Record the average of the two measurements. To check your blood pressure when you are not at a hospital or clinic, you can use: ? An automated blood pressure machine at a pharmacy. ? A home blood pressure monitor.  If  you are between 19 years and 38 years old, ask your health care provider if you should take aspirin to prevent strokes.  Have regular diabetes screenings. This involves taking a blood sample to check your fasting blood sugar level. ? If you are at a normal weight and have a low risk for diabetes, have this test once every three years after 46 years of age. ? If you are overweight and have a high risk for diabetes, consider being tested at a younger age or more often. Preventing infection Hepatitis B  If you have a higher risk for hepatitis B, you should be screened for this virus. You are considered at high risk for hepatitis B if: ? You were born in a country where hepatitis B is common. Ask your health care provider which countries are considered high risk. ? Your parents were born in a high-risk country, and you have not been immunized against hepatitis B (hepatitis B vaccine). ? You have HIV or AIDS. ? You use needles to inject street drugs. ? You live with someone who has hepatitis B. ? You have had sex with someone who has hepatitis B. ? You get hemodialysis treatment. ? You take certain medicines for conditions, including cancer, organ transplantation, and autoimmune conditions.  Hepatitis C  Blood testing is recommended for: ? Everyone born from [birth year] through 1964. ? Anyone with known risk factors for hepatitis C.  Sexually transmitted infections (STIs)  You should be screened for sexually transmitted infections (STIs) including gonorrhea and chlamydia if: ? You are sexually active and are younger than 46 years of age. ? You are older than 46 years of age and your health care provider tells you that you are at risk for this type of infection. ? Your sexual activity has changed since you were last screened and you are at an increased risk for chlamydia or gonorrhea. Ask your health care provider if you are at risk.  If you do not have HIV, but are at risk, it may be recommended  that you take a prescription medicine daily to prevent HIV infection. This is called pre-exposure prophylaxis (PrEP). You are considered at risk if: ? You are sexually active and do not regularly use condoms or know the HIV status of your partner(s). ? You take drugs by injection. ? You are sexually active with a partner who has HIV.  Talk with your health care provider about whether you are at high risk of  being infected with HIV. If you choose to begin PrEP, you should first be tested for HIV. You should then be tested every 3 months for as long as you are taking PrEP. Pregnancy  If you are premenopausal and you may become pregnant, ask your health care provider about preconception counseling.  If you may become pregnant, take 400 to 800 micrograms (mcg) of folic acid every day.  If you want to prevent pregnancy, talk to your health care provider about birth control (contraception). Osteoporosis and menopause  Osteoporosis is a disease in which the bones lose minerals and strength with aging. This can result in serious bone fractures. Your risk for osteoporosis can be identified using a bone density scan.  If you are 40 years of age or older, or if you are at risk for osteoporosis and fractures, ask your health care provider if you should be screened.  Ask your health care provider whether you should take a calcium or vitamin D supplement to lower your risk for osteoporosis.  Menopause may have certain physical symptoms and risks.  Hormone replacement therapy may reduce some of these symptoms and risks. Talk to your health care provider about whether hormone replacement therapy is right for you. Follow these instructions at home:  Schedule regular health, dental, and eye exams.  Stay current with your immunizations.  Do not use any tobacco products including cigarettes, chewing tobacco, or electronic cigarettes.  If you are pregnant, do not drink alcohol.  If you are  breastfeeding, limit how much and how often you drink alcohol.  Limit alcohol intake to no more than 1 drink per day for nonpregnant women. One drink equals 12 ounces of beer, 5 ounces of wine, or 1 ounces of hard liquor.  Do not use street drugs.  Do not share needles.  Ask your health care provider for help if you need support or information about quitting drugs.  Tell your health care provider if you often feel depressed.  Tell your health care provider if you have ever been abused or do not feel safe at home. This information is not intended to replace advice given to you by your health care provider. Make sure you discuss any questions you have with your health care provider. Document Released: 12/10/2010 Document Revised: 11/02/2015 Document Reviewed: 02/28/2015 Elsevier Interactive Patient Education  [employer].

## 2017-12-04 NOTE — Assessment & Plan Note (Signed)
Annual comprehensive preventive exam was done as well as an evaluation and management of chronic conditions .  During the course of the visit the patient was educated and counseled about appropriate screening and preventive services including :  diabetes screening, lipid analysis with projected  10 year  risk for CAD , nutrition counseling, breast, cervical and colorectal cancer screening, and recommended immunizations.  Printed recommendations for health maintenance screenings was given 

## 2017-12-04 NOTE — Assessment & Plan Note (Signed)
managed with ambien prn. The risks and benefits of hypnotics were reviewed with patient today including excessive sedation leading to respiratory depression,  impaired thinking/driving, and addiction.  Patient was advised to avoid concurrent use with alcohol, to use medication only as needed and not to share with others  .      

## 2017-12-04 NOTE — Assessment & Plan Note (Signed)
I have reviewed   BMI and reinforced need to follow a low glycemic index diet utilizing smaller more frequent meals to increase metabolism.  I have also recommended that patient increase her exercising with a goal of 30 minutes of aerobic exercise a minimum of 5 days per week.  First goal is 246 lbs,  Her weight in 2014. Screening for lipid disorders, thyroid and diabetes is done annually.

## 2017-12-04 NOTE — Assessment & Plan Note (Signed)
Well controlled on current regimen. Renal function stable, no changes today.  Lab Results  Component Value Date   CREATININE 0.76 12/03/2017   Lab Results  Component Value Date   NA 138 12/03/2017   K 3.7 12/03/2017   CL 103 12/03/2017   CO2 28 12/03/2017

## 2018-03-17 ENCOUNTER — Other Ambulatory Visit: Payer: Self-pay | Admitting: Internal Medicine

## 2018-03-17 DIAGNOSIS — Z1231 Encounter for screening mammogram for malignant neoplasm of breast: Secondary | ICD-10-CM

## 2018-04-02 ENCOUNTER — Other Ambulatory Visit: Payer: Self-pay | Admitting: Internal Medicine

## 2018-04-03 NOTE — Telephone Encounter (Signed)
Printed and signed.  

## 2018-04-03 NOTE — Telephone Encounter (Signed)
Refilled: 10/03/2017 Last OV: 12/03/2017 Next OV: not scheduled

## 2018-04-06 ENCOUNTER — Other Ambulatory Visit: Payer: Self-pay | Admitting: Internal Medicine

## 2018-04-07 NOTE — Telephone Encounter (Signed)
Script faxed to [facility] pharmacy  

## 2018-05-01 ENCOUNTER — Ambulatory Visit
Admission: RE | Admit: 2018-05-01 | Discharge: 2018-05-01 | Disposition: A | Discharge status: Home / Self Care | Payer: 59 | Source: Ambulatory Visit | Attending: Internal Medicine | Admitting: Internal Medicine

## 2018-05-01 DIAGNOSIS — Z1231 Encounter for screening mammogram for malignant neoplasm of breast: Secondary | ICD-10-CM | POA: Diagnosis not present

## 2018-05-18 ENCOUNTER — Other Ambulatory Visit: Payer: Self-pay | Admitting: Internal Medicine

## 2018-06-14 DIAGNOSIS — H6691 Otitis media, unspecified, right ear: Secondary | ICD-10-CM | POA: Diagnosis not present

## 2018-06-14 DIAGNOSIS — J069 Acute upper respiratory infection, unspecified: Secondary | ICD-10-CM | POA: Diagnosis not present

## 2018-09-04 ENCOUNTER — Other Ambulatory Visit: Payer: Self-pay | Admitting: Internal Medicine

## 2018-09-04 NOTE — Telephone Encounter (Signed)
Refilled: 04/03/2018 Last OV: 12/03/2017 Next OV: 12/09/2018

## 2018-09-04 NOTE — Telephone Encounter (Signed)
Refills denied until she is seen because it is a controlled substnace

## 2018-09-08 NOTE — Telephone Encounter (Signed)
Pt is scheduled for a virtual visit tomorrow at 3pm.

## 2018-09-09 ENCOUNTER — Ambulatory Visit (INDEPENDENT_AMBULATORY_CARE_PROVIDER_SITE_OTHER): Payer: 59 | Admitting: Internal Medicine

## 2018-09-09 DIAGNOSIS — F5101 Primary insomnia: Secondary | ICD-10-CM

## 2018-09-09 DIAGNOSIS — Z6841 Body Mass Index (BMI) 40.0 and over, adult: Secondary | ICD-10-CM | POA: Diagnosis not present

## 2018-09-09 DIAGNOSIS — E161 Other hypoglycemia: Secondary | ICD-10-CM

## 2018-09-09 DIAGNOSIS — I10 Essential (primary) hypertension: Secondary | ICD-10-CM

## 2018-09-09 NOTE — Assessment & Plan Note (Signed)
managed with ambien prn. The risks and benefits of hypnotics were reviewed with patient today including excessive sedation leading to respiratory depression,  impaired thinking/driving, and addiction.  Patient was advised to avoid concurrent use with alcohol, to use medication only as needed and not to share with others  .      

## 2018-09-09 NOTE — Assessment & Plan Note (Signed)
Encouraged to buy food wisely and limit access to junk food.  Encouraged to resume daily exercise as she was doing In the past

## 2018-09-09 NOTE — Assessment & Plan Note (Signed)
Recent readings are reportedly normal,  But she is overdue for monitoring of of cr and pr,  refills given and labs ordered

## 2018-09-09 NOTE — Assessment & Plan Note (Signed)
Resolved with modification of poor diet and acknowledgement of  pre diabetes.  a1c should be rechecked,  Diet reviewed

## 2018-09-09 NOTE — Progress Notes (Signed)
. Virtual Visit via Video Note  I connected with@ on 09/09/18 at  3:00 PM EDT by a video enabled telemedicine application and verified that I am speaking with the correct person using two identifiers.  Location patient: home Location provider:work or home office Persons participating in the virtual visit: patient, provider  I discussed the limitations of evaluation and management by telemedicine and the availability of in person appointments. The patient expressed understanding and agreed to proceed.   HPI: Obesity with reactive hypoglycemia,  BMI 44 , has not been able to lose weight in the last several months    Reactive hypoglycemia.  Last seen June 2019. a1c 5.7 eating habits reviewed,  No recent episodes   chronic insomnia managed with ambien. Trying not to use it on a daily basis   Hypertension: patient checks blood pressure twice weekly at home.  Readings have been for the most part < 140/80 at rest . Patient is following a reduce salt diet most days and is taking medications as prescribed    ROS: See pertinent positives and negatives per HPI.  Past Medical History:  Diagnosis Date  . H/O pelvic inflammatory disease   . History of recurrent miscarriages, not currently pregnant   . Hypertension   . Obesity (BMI 30-39.9)   . Plantar fasciitis   . Reactive hypoglycemia 2011    Past Surgical History:  Procedure Laterality Date  . CHOLECYSTECTOMY  2007   [facility]  . GALLBLADDER SURGERY      Family History  Problem Relation Age of Onset  . Diabetes Mother   . Heart disease Father        CAD tobacco   . Cancer Maternal Grandmother        throat CA (snuff)  . Heart disease Paternal Aunt   . Heart disease Paternal Uncle   . Birth defects Paternal Uncle   . Heart disease Paternal Grandfather   . Breast cancer Neg Hx     SOCIAL HX: single mom, lives with teenage daughter and elderly mother    Current Outpatient Medications:  .  amLODipine (NORVASC) 2.5 MG tablet,  TAKE 1 TABLET BY MOUTH DAILY., Disp: 90 tablet, Rfl: 3 .  losartan-hydrochlorothiazide (HYZAAR) 100-12.5 MG tablet, TAKE 1 TABLET BY MOUTH DAILY., Disp: 90 tablet, Rfl: 3 .  metoprolol succinate (TOPROL-XL) 50 MG 24 hr tablet, TAKE 1 TABLET BY MOUTH DAILY. TAKE WITH OR IMMEDIATELY FOLLOWING A MEAL., Disp: 90 tablet, Rfl: 3 .  zolpidem (AMBIEN) 5 MG tablet, TAKE 1 TABLET BY MOUTH ONCE DAILY AT BEDTIME AS NEEDED, Disp: 30 tablet, Rfl: 5  Current Facility-Administered Medications:  .  triamcinolone acetonide (KENALOG) 10 MG/ML injection 10 mg, 10 mg, Other, Once, Stover, Titorya, DPM .  triamcinolone acetonide (KENALOG-40) injection 20 mg, 20 mg, Other, Once, Stover, [place], DPM  EXAM:  VITALS per patient if applicable:  GENERAL: alert, oriented, appears well and in no acute distress  HEENT: atraumatic, conjunttiva clear, no obvious abnormalities on inspection of external nose and ears  NECK: normal movements of the head and neck  LUNGS: on inspection no signs of respiratory distress, breathing rate appears normal, no obvious gross SOB, gasping or wheezing  CV: no obvious cyanosis  MS: moves all visible extremities without noticeable abnormality  PSYCH/NEURO: pleasant and cooperative, no obvious depression or anxiety, speech and thought processing grossly intact  ASSESSMENT AND PLAN:  Morbid obesity with BMI of 40.0-44.9, adult Encouraged to buy food wisely and limit access to junk food.  Encouraged  to resume daily exercise as she was doing In the past  White coat syndrome with hypertension Recent readings are reportedly normal,  But she is overdue for monitoring of of cr and pr,  refills given and labs ordered   Reactive hypoglycemia Resolved with modification of poor diet and acknowledgement of  pre diabetes.  a1c should be rechecked,  Diet reviewed   Insomnia managed with ambien prn. The risks and benefits of hypnotics were reviewed with patient today including excessive  sedation leading to respiratory depression,  impaired thinking/driving, and addiction.  Patient was advised to avoid concurrent use with alcohol, to use medication only as needed and not to share with others  .        Updated Medication List Outpatient Encounter Medications as of 09/09/2018  Medication Sig  . amLODipine (NORVASC) 2.5 MG tablet TAKE 1 TABLET BY MOUTH DAILY.  [name] losartan-hydrochlorothiazide (HYZAAR) 100-12.5 MG tablet TAKE 1 TABLET BY MOUTH DAILY.  . metoprolol succinate (TOPROL-XL) 50 MG 24 hr tablet TAKE 1 TABLET BY MOUTH DAILY. TAKE WITH OR IMMEDIATELY FOLLOWING A MEAL.  [name] zolpidem (AMBIEN) 5 MG tablet TAKE 1 TABLET BY MOUTH ONCE DAILY AT BEDTIME AS NEEDED   Facility-Administered Encounter Medications as of 09/09/2018  Medication  . triamcinolone acetonide (KENALOG) 10 MG/ML injection 10 mg  . triamcinolone acetonide (KENALOG-40) injection 20 mg      I discussed the assessment and treatment plan with the patient. The patient was provided an opportunity to ask questions and all were answered. The patient agreed with the plan and demonstrated an understanding of the instructions.   The patient was advised to call back or seek an in-person evaluation if the symptoms worsen or if the condition fails to improve as anticipated.  I provided 25 minutes of non-face-to-face time during this encounter.   [name], MD

## 2018-09-22 NOTE — Telephone Encounter (Signed)
Pt is scheduled °

## 2018-09-24 ENCOUNTER — Other Ambulatory Visit (INDEPENDENT_AMBULATORY_CARE_PROVIDER_SITE_OTHER): Payer: 59

## 2018-09-24 ENCOUNTER — Other Ambulatory Visit: Payer: Self-pay

## 2018-09-24 DIAGNOSIS — I10 Essential (primary) hypertension: Secondary | ICD-10-CM | POA: Diagnosis not present

## 2018-09-24 DIAGNOSIS — E161 Other hypoglycemia: Secondary | ICD-10-CM

## 2018-09-24 LAB — COMPREHENSIVE METABOLIC PANEL
ALT: 16 U/L (ref 0–35)
AST: 18 U/L (ref 0–37)
Albumin: 4.1 g/dL (ref 3.5–5.2)
Alkaline Phosphatase: 62 U/L (ref 39–117)
BUN: 11 mg/dL (ref 6–23)
CO2: 25 mEq/L (ref 19–32)
Calcium: 8.9 mg/dL (ref 8.4–10.5)
Chloride: 104 mEq/L (ref 96–112)
Creatinine, Ser: 0.75 mg/dL (ref 0.40–1.20)
GFR: 100.43 mL/min (ref >60.00)
Glucose, Bld: 87 mg/dL (ref 70–99)
Potassium: 4.1 mEq/L (ref 3.5–5.1)
Sodium: 137 mEq/L (ref 135–145)
Total Bilirubin: 0.5 mg/dL (ref 0.2–1.2)
Total Protein: 7.6 g/dL (ref 6.0–8.3)

## 2018-09-24 LAB — HEMOGLOBIN A1C: Hgb A1c MFr Bld: 5.7 % (ref 4.6–6.5)

## 2018-10-05 MED ORDER — ZOLPIDEM TARTRATE 5 MG PO TABS: ORAL_TABLET | ORAL | 5 refills | Status: DC

## 2018-10-19 ENCOUNTER — Encounter: Payer: Self-pay | Admitting: Family Medicine

## 2018-10-19 ENCOUNTER — Other Ambulatory Visit: Payer: Self-pay

## 2018-10-19 ENCOUNTER — Telehealth: Payer: Self-pay

## 2018-10-19 ENCOUNTER — Ambulatory Visit (INDEPENDENT_AMBULATORY_CARE_PROVIDER_SITE_OTHER): Payer: 59 | Admitting: Family Medicine

## 2018-10-19 DIAGNOSIS — M62831 Muscle spasm of calf: Secondary | ICD-10-CM

## 2018-10-19 DIAGNOSIS — M5441 Lumbago with sciatica, right side: Secondary | ICD-10-CM

## 2018-10-19 MED ORDER — CYCLOBENZAPRINE HCL 5 MG PO TABS: 5.0000 mg | ORAL_TABLET | Freq: Three times a day (TID) | ORAL | 1 refills | Status: DC | PRN

## 2018-10-19 MED ORDER — METHYLPREDNISOLONE 4 MG PO TBPK: ORAL_TABLET | ORAL | 0 refills | Status: DC

## 2018-10-19 NOTE — Telephone Encounter (Signed)
Copied from [place] [id number]. Topic: General - Other >> Oct 19, 2018  7:41 AM Carolyn Stare wrote:  Pt req appt for  muscle spasm in butt and legs

## 2018-10-19 NOTE — Telephone Encounter (Signed)
Called pt and appt scheduled for today with L. Guse.  Markea Ruzich,cma

## 2018-10-19 NOTE — Progress Notes (Signed)
Patient ID: Melanie Chavez, female   DOB: [birth year], 48 y.o.   MRN: [id number]    Virtual Visit via video Note  This visit type was conducted due to national recommendations for restrictions regarding the COVID-19 pandemic (e.g. social distancing).  This format is felt to be most appropriate for this patient at this time.  All issues noted in this document were discussed and addressed.  No physical exam was performed (except for noted visual exam findings with Video Visits).   I connected with Melanie Chavez today at  9:20 AM EDT by a video enabled telemedicine application or telephone and verified that I am speaking with the correct person using two identifiers. Location patient: home Location provider: [facility] Persons participating in the virtual visit: patient, provider  I discussed the limitations, risks, security and privacy concerns of performing an evaluation and management service by video and the availability of in person appointments. I also discussed with the patient that there may be a patient responsible charge related to this service. The patient expressed understanding and agreed to proceed.  HPI:  Patient and I connected via video due to complaints of muscle spasm in right buttock down right leg that began a couple of days ago.   Denies any fall or injury to low back or leg.  Patient states she has been working from home throughout this pandemic, and is not in her usual chair.  States she has been sitting at her kitchen table on a hard wooden chair and wonders if that could be exacerbating the pain in the leg & muscle spasms.  Denies fever or chills.  Denies cough, shortness breath or wheezing.  Denies loss of bowel or bladder control.  Denies chest pain, palpitations.  Denies GI or GU issues.  States she has been walking a few days a week with her daughter, but has not been doing stretching much throughout the day.   ROS: See pertinent positives and negatives  per HPI.  Past Medical History:  Diagnosis Date  . H/O pelvic inflammatory disease   . History of recurrent miscarriages, not currently pregnant   . Hypertension   . Obesity (BMI 30-39.9)   . Plantar fasciitis   . Reactive hypoglycemia 2011    Past Surgical History:  Procedure Laterality Date  . CHOLECYSTECTOMY  2007   [facility]  . GALLBLADDER SURGERY      Family History  Problem Relation Age of Onset  . Diabetes Mother   . Heart disease Father        CAD tobacco   . Cancer Maternal Grandmother        throat CA (snuff)  . Heart disease Paternal Aunt   . Heart disease Paternal Uncle   . Birth defects Paternal Uncle   . Heart disease Paternal Grandfather   . Breast cancer Neg Hx    Social History   Tobacco Use  . Smoking status: Never Smoker  . Smokeless tobacco: Never Used  Substance Use Topics  . Alcohol use: Not on file     Current Outpatient Medications:  .  amLODipine (NORVASC) 2.5 MG tablet, TAKE 1 TABLET BY MOUTH DAILY., Disp: 90 tablet, Rfl: 3 .  losartan-hydrochlorothiazide (HYZAAR) 100-12.5 MG tablet, TAKE 1 TABLET BY MOUTH DAILY., Disp: 90 tablet, Rfl: 3 .  metoprolol succinate (TOPROL-XL) 50 MG 24 hr tablet, TAKE 1 TABLET BY MOUTH DAILY. TAKE WITH OR IMMEDIATELY FOLLOWING A MEAL., Disp: 90 tablet, Rfl: 3 .  zolpidem (AMBIEN) 5 MG tablet, TAKE  1 TABLET BY MOUTH ONCE DAILY AT BEDTIME AS NEEDED, Disp: 30 tablet, Rfl: 5  Current Facility-Administered Medications:  .  triamcinolone acetonide (KENALOG) 10 MG/ML injection 10 mg, 10 mg, Other, Once, Stover, Titorya, DPM .  triamcinolone acetonide (KENALOG-40) injection 20 mg, 20 mg, Other, Once, Stover, [place], DPM  EXAM:  GENERAL: alert, oriented, appears well and in no acute distress  HEENT: atraumatic, conjunttiva clear, no obvious abnormalities on inspection of external nose and ears  NECK: normal movements of the head and neck  LUNGS: on inspection no signs of respiratory distress, breathing rate  appears normal, no obvious gross SOB, gasping or wheezing  CV: no obvious cyanosis  MS: moves all visible extremities without noticeable abnormality  PSYCH/NEURO: pleasant and cooperative, no obvious depression or anxiety, speech and thought processing grossly intact  ASSESSMENT AND PLAN:  Discussed the following assessment and plan:  Acute right-sided low back pain with right-sided sciatica - Plan: methylPREDNISolone (MEDROL DOSEPAK) 4 MG TBPK tablet  Muscle spasm of right calf - Plan: methylPREDNISolone (MEDROL DOSEPAK) 4 MG TBPK tablet, cyclobenzaprine (FLEXERIL) 5 MG tablet  Suspect muscle spasm/pain in right buttock cheek going down right leg is related to some low back pain with right-sided sciatica.  Will do low-dose steroid taper and patient will take Flexeril as needed for spasm.  Discussed getting up and doing stretches throughout the day to stretch out low back and stretch out legs to avoid stiffness and tightness of muscles.  Also discussed trying topical rub such as BenGay or Biofreeze on sore areas, using a heating pad.  If pain and spasms persist, patient advised to call office and let us know.   I discussed the assessment and treatment plan with the patient. The patient was provided an opportunity to ask questions and all were answered. The patient agreed with the plan and demonstrated an understanding of the instructions.   The patient was advised to call back or seek an in-person evaluation if the symptoms worsen or if the condition fails to improve as anticipated.   Jodelle Green, FNP

## 2018-10-25 ENCOUNTER — Encounter: Payer: Self-pay | Admitting: Family Medicine

## 2018-10-25 DIAGNOSIS — M5441 Lumbago with sciatica, right side: Secondary | ICD-10-CM

## 2018-11-04 ENCOUNTER — Other Ambulatory Visit: Payer: Self-pay

## 2018-11-04 ENCOUNTER — Ambulatory Visit: Payer: 59 | Attending: Family Medicine

## 2018-11-04 DIAGNOSIS — M545 Low back pain, unspecified: Secondary | ICD-10-CM

## 2018-11-04 DIAGNOSIS — M25552 Pain in left hip: Secondary | ICD-10-CM | POA: Insufficient documentation

## 2018-11-04 DIAGNOSIS — M25551 Pain in right hip: Secondary | ICD-10-CM | POA: Diagnosis not present

## 2018-11-04 NOTE — Therapy (Signed)
[facility] [address], [place], [address] Phone: [phone]   Fax:  [phone]  Physical Therapy Evaluation  Patient Details  Name: Melanie Chavez MRN: [id number] Date of Birth: 1971/05/22 Referring Provider (PT): Philis Nettle   Encounter Date: 11/04/2018  PT End of Session - 11/04/18 1653    Visit Number  1    Number of Visits  13    Date for PT Re-Evaluation  12/16/18    PT Start Time  1400    PT Stop Time  1455    PT Time Calculation (min)  55 min    Activity Tolerance  Patient tolerated treatment well    Behavior During Therapy  [facility] for tasks assessed/performed       Past Medical History:  Diagnosis Date  . H/O pelvic inflammatory disease   . History of recurrent miscarriages, not currently pregnant   . Hypertension   . Obesity (BMI 30-39.9)   . Plantar fasciitis   . Reactive hypoglycemia 2011    Past Surgical History:  Procedure Laterality Date  . CHOLECYSTECTOMY  2007   [facility]  . GALLBLADDER SURGERY      There were no vitals filed for this visit.   Subjective Assessment - 11/04/18 1408    Subjective  Patient is a 48 yo female with B hip pain with radiating symptoms down to her knees bilaterally. Patient reports increased pain with walking and standing for porlonged periods as soon as she stands. Patient reports her pain is improved muscule relaxers, tylenol, aspercream, or heating pad.     Pertinent History  Patient reports increased pain along B hips and back starting 5/10, took prednisone which helped but did not abolish pain.     Limitations  Standing;Walking    How long can you sit comfortably?  unlimited    How long can you stand comfortably?  as soon as her stands    How long can you walk comfortably?  as soon as she begins    Diagnostic tests  none compeleted     Patient Stated Goals  To get rid of pain    Currently in Pain?  Yes    Pain Score  5    worst: 10/10;  best: 0/10   Pain Location  Hip    Pain Orientation  Right;Left    Pain Descriptors / Indicators  Aching;Tingling;Shooting    Pain Type  Acute pain    Pain Onset  More than a month ago    Pain Frequency  Intermittent         [facility] PT Assessment - 11/04/18 1802      Assessment   Medical Diagnosis  Low back pain    Referring Provider (PT)  Chauncy Passy, Lauren    Onset Date/Surgical Date  10/18/18    Hand Dominance  Right    Next MD Visit  unknown    Prior Therapy  yes      Balance Screen   Has the patient fallen in the past 6 months  No    Has the patient had a decrease in activity level because of a fear of falling?   Yes    Is the patient reluctant to leave their home because of a fear of falling?   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[employer], counselling [employer]      Functional Tests   Functional tests  Squat;Sit to Stand      Squat   Comments  Slow to perform, difficult to return to neutral      Sit to Stand   Comments  Slow to perform, difficult to return to neutral      Posture/Postural Control   Posture Comments  Forward lumbar lean      ROM / Strength   AROM / PROM / Strength  AROM;Strength      AROM   AROM Assessment Site  Hip;Lumbar    Right/Left Hip  Right;Left    Right Hip Extension  5    Right Hip Flexion  110    Right Hip External Rotation   30    Right Hip Internal Rotation   30   pain   Right Hip ABduction  30    Right Hip ADduction  20    Left Hip Extension  5    Left Hip Flexion  110    Left Hip External Rotation   30    Left Hip Internal Rotation   30    Left Hip ABduction  30    Left Hip ADduction  20    Lumbar Flexion  WNL - pain with return to standing    Lumbar  Extension  25% limited increased pain    Lumbar - Right Side Bend  25% limited    Lumbar - Left Side Bend  25% limited    Lumbar - Right Rotation  WNL    Lumbar - Left Rotation  WNL      Strength   Strength Assessment Site  Hip    Right/Left Hip  Right;Left    Right Hip Flexion  5/5    Right Hip Extension  3+/5    Right Hip External Rotation   4/5    Right Hip Internal Rotation  4-/5   pain   Right Hip ABduction  4-/5    Right Hip ADduction  4+/5    Left Hip Flexion  5/5    Left Hip Extension  4/5    Left Hip External Rotation  4/5    Left Hip Internal Rotation  4/5    Left Hip ABduction  4/5    Left Hip ADduction  4/5      Palpation   Palpation comment  TTP: glute max, med, min, piriformis area B      Special Tests   Other special tests  FABER: neg, FADIR: pos on R       Objective measurements completed on examination: See above findings.    TREATMENT Prone press ups - x 10  Standing CKC lumbar/hip extension -- x 10 Bridges -- x 10 SLUMP flossing in sitting -- x 10  Performed exercises to improve pain and spasms along the affected area. No increase in pain throughout the session. Patient demonstrates slight improvement in symptoms at end of the session       PT Education - 11/04/18 1434    Education Details  HEP: Prone press ups, standing lumbar extension      Person(s) Educated  Patient    Methods  Explanation;Demonstration;Handout  Comprehension  Verbalized understanding;Returned demonstration          PT Long Term Goals - 11/04/18 1659      PT LONG TERM GOAL #1   Title  Patient will be independent with HEP to continue benefits of therapy until discharge.    Baseline  dependent with form and technique    Time  6    Period  Weeks    Status  New    Target Date  11/04/18      PT LONG TERM GOAL #2   Title  Patient will be able to ambulate >1 hour to indicate improvement in lumbar function and improvement with ability to go to the grocery store.     Baseline  Onset of pain as soon as she stands    Time  6    Period  Weeks    Status  New    Target Date  12/16/18      PT LONG TERM GOAL #3   Title  Patient will be able to stand for over 1 hour without increase in pain to better be able to prepare food without requiring a sitting rest break    Baseline  Can stand for 31min with pain    Time  6    Period  Weeks    Status  New    Target Date  12/16/18             Plan - 11/04/18 1654    Clinical Impression Statement  Patient is a 48 yo right hand dominant female presenting with B hip and back pain most notable in weight bearing positions. Patient demonstrates increased B hip/lumbar dysfunction with symptoms that align with lumbar directional perference and sciatic nerve irratation B. Patient demonstrates poor hip strength along the posterior chain and has improvement in symptoms after performing extension based exercises. Patient will benefit from further skilled therapy to return to prior level of function.     Personal Factors and Comorbidities  Fitness;Comorbidity 1    Comorbidities  Obesity    Examination-Activity Limitations  Squat;Bend    Examination-Participation Restrictions  Laundry;Yard Work;Cleaning    Stability/Clinical Decision Making  Stable/Uncomplicated    Clinical Decision Making  Low    Rehab Potential  Good    PT Frequency  2x / week    PT Duration  6 weeks    PT Treatment/Interventions  Therapeutic activities;Therapeutic exercise;Electrical Stimulation;Moist Heat;Ultrasound;Gait training;Stair training;Patient/family education;Neuromuscular re-education;Manual techniques;Dry needling;Passive range of motion;Spinal Manipulations;Joint Manipulations;Cryotherapy    PT Next Visit Plan  Continue to focus on nerve mobilization and extension based exercises    PT Home Exercise Plan  Nerve flossing, Cobra    Consulted and Agree with Plan of Care  Patient       Patient will benefit from skilled therapeutic  intervention in order to improve the following deficits and impairments:  Abnormal gait, Decreased balance, Decreased strength, Decreased range of motion, Decreased activity tolerance, Difficulty walking, Decreased mobility, Increased muscle spasms, Pain, Postural dysfunction  Visit Diagnosis: Pain in left hip  Pain in right hip  Acute bilateral low back pain, unspecified whether sciatica present     Problem List Patient Active Problem List   Diagnosis Date Noted  . Insomnia 07/22/2014  . Snoring 05/03/2013  . Visit for preventive health examination 10/17/2011  . H/O pelvic inflammatory disease   . Reactive hypoglycemia   . History of recurrent miscarriages, not currently pregnant   . Morbid obesity with BMI of 40.0-44.9, adult (  [place]) 07/17/2011  . White coat syndrome with hypertension 07/17/2011    Blythe Stanford, PT DPT 11/04/2018, 6:08 PM  [facility] [address] [place], [address] Phone: [phone]   Fax:  [phone]  Name: Melanie Chavez MRN: [id number] Date of Birth: December 22, 1970

## 2018-11-09 ENCOUNTER — Ambulatory Visit: Payer: 59 | Attending: Family Medicine

## 2018-11-09 ENCOUNTER — Other Ambulatory Visit: Payer: Self-pay

## 2018-11-09 DIAGNOSIS — M25551 Pain in right hip: Secondary | ICD-10-CM | POA: Diagnosis not present

## 2018-11-09 DIAGNOSIS — M62838 Other muscle spasm: Secondary | ICD-10-CM | POA: Insufficient documentation

## 2018-11-09 DIAGNOSIS — M25552 Pain in left hip: Secondary | ICD-10-CM | POA: Diagnosis not present

## 2018-11-09 DIAGNOSIS — M545 Low back pain, unspecified: Secondary | ICD-10-CM

## 2018-11-09 NOTE — Therapy (Signed)
[facility] [address], [place], [address] Phone: [phone]   Fax:  [phone]  Physical Therapy Treatment  Patient Details  Name: Melanie Chavez MRN: [id number] Date of Birth: [birth year] Referring Provider (PT): Philis Nettle   Encounter Date: 11/09/2018  PT End of Session - 11/09/18 1517    Visit Number  2    Number of Visits  13    Date for PT Re-Evaluation  12/16/18    PT Start Time  1500    PT Stop Time  1545    PT Time Calculation (min)  45 min    Activity Tolerance  Patient tolerated treatment well    Behavior During Therapy  [facility] for tasks assessed/performed       Past Medical History:  Diagnosis Date  . H/O pelvic inflammatory disease   . History of recurrent miscarriages, not currently pregnant   . Hypertension   . Obesity (BMI 30-39.9)   . Plantar fasciitis   . Reactive hypoglycemia 2011    Past Surgical History:  Procedure Laterality Date  . CHOLECYSTECTOMY  2007   [facility]  . GALLBLADDER SURGERY      There were no vitals filed for this visit.  Subjective Assessment - 11/09/18 1511    Subjective  Patient reporrts her symptoms have been improving slightly since the previous session. Patient reports she continues to have pain most notably when she returns from sitting to standing.     Pertinent History  Patient reports increased pain along B hips and back starting 5/10, took prednisone which helped but did not abolish pain. Patient reports pain started 2 weeks ago    Limitations  Standing;Walking    How long can you sit comfortably?  unlimited    How long can you stand comfortably?  as soon as her stands    How long can you walk comfortably?  as soon as she begins    Diagnostic tests  none compeleted     Patient Stated Goals  To get rid of pain    Currently in Pain?  Yes    Pain Score  3     Pain Location  Hip    Pain Orientation  Right;Left    Pain Descriptors / Indicators   Aching;Tingling    Pain Type  Acute pain    Pain Onset  More than a month ago    Pain Frequency  Intermittent       TREATMENT Therapeutic Exercise Prone press ups 5 x 5 reps with deep breathing at the top positioning Ambulation with focus on performing greater hip IR - x 1131ft SLUMP nerve tension in sitting - x 20 Hip abduction in standing with UE support -2  x 10   Standing squats with UE - 2 x 10 with use of a chair  Patient demonstrates decreased pain after performing exercises focused on improving lumbar extension and focused on performing these motions to help decrease    PT Education - 11/09/18 1516    Education Details  HEP: Prone press ups reinterated    Person(s) Educated  Patient    Methods  Explanation;Demonstration    Comprehension  Verbalized understanding;Returned demonstration          PT Long Term Goals - 11/04/18 1659      PT LONG TERM GOAL #1   Title  Patient will be independent with HEP to continue benefits of therapy until discharge.    Baseline  dependent with form and technique    Time  6    Period  Weeks    Status  New    Target Date  11/04/18      PT LONG TERM GOAL #2   Title  Patient will be able to ambulate >1 hour to indicate improvement in lumbar function and improvement with ability to go to the grocery store.    Baseline  Onset of pain as soon as she stands    Time  6    Period  Weeks    Status  New    Target Date  12/16/18      PT LONG TERM GOAL #3   Title  Patient will be able to stand for over 1 hour without increase in pain to better be able to prepare food without requiring a sitting rest break    Baseline  Can stand for 31min with pain    Time  6    Period  Weeks    Status  New    Target Date  12/16/18            Plan - 11/09/18 1537    Clinical Impression Statement  Patient demonstrates increased symptoms with performing hip abduction on the R side. Patient demosntrates less onset of symptoms with SLUMP positioning  compared to previous sessions indicating functional carryover between sessions. Patient will benefit from further skilled therapy focused on improving limitations to return to prior level of function.     Personal Factors and Comorbidities  Fitness;Comorbidity 1    Comorbidities  Obesity    Examination-Activity Limitations  Squat;Bend    Examination-Participation Restrictions  Laundry;Yard Work;Cleaning    Stability/Clinical Decision Making  Stable/Uncomplicated    Rehab Potential  Good    PT Frequency  2x / week    PT Duration  6 weeks    PT Treatment/Interventions  Therapeutic activities;Therapeutic exercise;Electrical Stimulation;Moist Heat;Ultrasound;Gait training;Stair training;Patient/family education;Neuromuscular re-education;Manual techniques;Dry needling;Passive range of motion;Spinal Manipulations;Joint Manipulations;Cryotherapy    PT Next Visit Plan  Continue to focus on nerve mobilization and extension based exercises    PT Home Exercise Plan  Nerve flossing, Cobra    Consulted and Agree with Plan of Care  Patient       Patient will benefit from skilled therapeutic intervention in order to improve the following deficits and impairments:  Abnormal gait, Decreased balance, Decreased strength, Decreased range of motion, Decreased activity tolerance, Difficulty walking, Decreased mobility, Increased muscle spasms, Pain, Postural dysfunction  Visit Diagnosis: Pain in left hip  Pain in right hip  Acute bilateral low back pain, unspecified whether sciatica present     Problem List Patient Active Problem List   Diagnosis Date Noted  . Insomnia 07/22/2014  . Snoring 05/03/2013  . Visit for preventive health examination 10/17/2011  . H/O pelvic inflammatory disease   . Reactive hypoglycemia   . History of recurrent miscarriages, not currently pregnant   . Morbid obesity with BMI of 40.0-44.9, adult ([place]) 07/17/2011  . White coat syndrome with hypertension 07/17/2011     Blythe Stanford, PT DPT 11/09/2018, 3:50 PM  [place] [facility] [address], [place], [address] Phone: [phone]   Fax:  [phone]  Name: NARJIS MIRA MRN: [id number] Date of Birth: 13-Dec-1970

## 2018-11-11 ENCOUNTER — Ambulatory Visit: Payer: 59

## 2018-11-11 ENCOUNTER — Other Ambulatory Visit: Payer: Self-pay

## 2018-11-11 DIAGNOSIS — M545 Low back pain, unspecified: Secondary | ICD-10-CM

## 2018-11-11 DIAGNOSIS — M62838 Other muscle spasm: Secondary | ICD-10-CM | POA: Diagnosis not present

## 2018-11-11 DIAGNOSIS — M25551 Pain in right hip: Secondary | ICD-10-CM

## 2018-11-11 DIAGNOSIS — M25552 Pain in left hip: Secondary | ICD-10-CM

## 2018-11-11 NOTE — Therapy (Signed)
[place] [facility] [address] [place], [address] Phone: [phone]   Fax:  [phone]  Physical Therapy Treatment  Patient Details  Name: Melanie Chavez MRN: [id number] Date of Birth: 02-17-1971 Referring Provider (PT): Philis Nettle   Encounter Date: 11/11/2018  PT End of Session - 11/11/18 1524    Visit Number  3    Number of Visits  13    Date for PT Re-Evaluation  12/16/18    PT Start Time  1500    PT Stop Time  1545    PT Time Calculation (min)  45 min    Activity Tolerance  Patient tolerated treatment well    Behavior During Therapy  [facility] for tasks assessed/performed       Past Medical History:  Diagnosis Date  . H/O pelvic inflammatory disease   . History of recurrent miscarriages, not currently pregnant   . Hypertension   . Obesity (BMI 30-39.9)   . Plantar fasciitis   . Reactive hypoglycemia 2011    Past Surgical History:  Procedure Laterality Date  . CHOLECYSTECTOMY  2007   [facility]  . GALLBLADDER SURGERY      There were no vitals filed for this visit.  Subjective Assessment - 11/11/18 1516    Subjective  Patient reports she continues to have increased pain but it has significantly decreased since the previous session and the exercises are becoming easier.     Pertinent History  Patient reports increased pain along B hips and back starting 5/10, took prednisone which helped but did not abolish pain. Patient reports pain started 2 weeks ago    Limitations  Standing;Walking    How long can you sit comfortably?  unlimited    How long can you stand comfortably?  as soon as her stands    How long can you walk comfortably?  as soon as she begins    Diagnostic tests  none compeleted     Patient Stated Goals  To get rid of pain    Currently in Pain?  Yes    Pain Score  2     Pain Location  Hip    Pain Orientation  Right;Left    Pain Descriptors / Indicators  Aching    Pain Type  Acute  pain    Pain Onset  More than a month ago    Pain Frequency  Intermittent         TREATMENT Therapeutic Exercise Prone press ups 4 x 6 reps with deep breathing at the top positioning Hip extension in standing - x10 B with UE support Hip flexor stretch in standing with a step for assistance -2  x 45 sec B Ambulation with focus on performing greater hip IR - x 321ft SLUMP nerve tension in sitting - x 20 Hip IR with RTB around ankles in prone - x 20  Hip abduction in standing with UE support -2  x 10   Standing squats with UE - x 10 with lumbar extension at end range   Patient demonstrates decreased pain after performing exercises focused on improving lumbar extension and focused on performing these motions to help decrease    PT Education - 11/11/18 1522    Education Details  form/technique with exercises; sit to stands with lumbar extension at the top position     Person(s) Educated  Patient    Methods  Explanation;Demonstration    Comprehension  Verbalized understanding;Returned demonstration  PT Long Term Goals - 11/04/18 1659      PT LONG TERM GOAL #1   Title  Patient will be independent with HEP to continue benefits of therapy until discharge.    Baseline  dependent with form and technique    Time  6    Period  Weeks    Status  New    Target Date  11/04/18      PT LONG TERM GOAL #2   Title  Patient will be able to ambulate >1 hour to indicate improvement in lumbar function and improvement with ability to go to the grocery store.    Baseline  Onset of pain as soon as she stands    Time  6    Period  Weeks    Status  New    Target Date  12/16/18      PT LONG TERM GOAL #3   Title  Patient will be able to stand for over 1 hour without increase in pain to better be able to prepare food without requiring a sitting rest break    Baseline  Can stand for 54min with pain    Time  6    Period  Weeks    Status  New    Target Date  12/16/18             Plan - 11/11/18 1531    Clinical Impression Statement  Patient demonstrates increased symptoms with hip abduction based exercises but is able to perform more abduction exercises compared to previus sessions indicating functional carryover between sessions. Although patient is improving, she continues to have a bas line of pain and difficulty with walking long distances. Patient will benefit from further skilled therapy to address limitations in ROM that are activating her pain.      Personal Factors and Comorbidities  Fitness;Comorbidity 1    Comorbidities  Obesity    Examination-Activity Limitations  Squat;Bend    Examination-Participation Restrictions  Laundry;Yard Work;Cleaning    Stability/Clinical Decision Making  Stable/Uncomplicated    Rehab Potential  Good    PT Frequency  2x / week    PT Duration  6 weeks    PT Treatment/Interventions  Therapeutic activities;Therapeutic exercise;Electrical Stimulation;Moist Heat;Ultrasound;Gait training;Stair training;Patient/family education;Neuromuscular re-education;Manual techniques;Dry needling;Passive range of motion;Spinal Manipulations;Joint Manipulations;Cryotherapy    PT Next Visit Plan  Continue to focus on nerve mobilization and extension based exercises    PT Home Exercise Plan  Nerve flossing, Cobra    Consulted and Agree with Plan of Care  Patient       Patient will benefit from skilled therapeutic intervention in order to improve the following deficits and impairments:  Abnormal gait, Decreased balance, Decreased strength, Decreased range of motion, Decreased activity tolerance, Difficulty walking, Decreased mobility, Increased muscle spasms, Pain, Postural dysfunction  Visit Diagnosis: Pain in left hip  Pain in right hip  Acute bilateral low back pain, unspecified whether sciatica present     Problem List Patient Active Problem List   Diagnosis Date Noted  . Insomnia 07/22/2014  . Snoring 05/03/2013  . Visit  for preventive health examination 10/17/2011  . H/O pelvic inflammatory disease   . Reactive hypoglycemia   . History of recurrent miscarriages, not currently pregnant   . Morbid obesity with BMI of 40.0-44.9, adult ([place]) 07/17/2011  . White coat syndrome with hypertension 07/17/2011    Blythe Stanford, PT DPT 11/11/2018, 3:47 PM  [place] [facility] [address]  [place], [place], [address] Phone: [phone]   Fax:  [phone]  Name: Melanie Chavez MRN: [id number] Date of Birth: 1970-11-20

## 2018-11-16 ENCOUNTER — Ambulatory Visit: Payer: 59

## 2018-11-18 ENCOUNTER — Other Ambulatory Visit: Payer: Self-pay

## 2018-11-18 ENCOUNTER — Ambulatory Visit: Payer: 59

## 2018-11-18 DIAGNOSIS — M25551 Pain in right hip: Secondary | ICD-10-CM

## 2018-11-18 DIAGNOSIS — M62838 Other muscle spasm: Secondary | ICD-10-CM | POA: Diagnosis not present

## 2018-11-18 DIAGNOSIS — M25552 Pain in left hip: Secondary | ICD-10-CM | POA: Diagnosis not present

## 2018-11-18 DIAGNOSIS — M545 Low back pain: Secondary | ICD-10-CM | POA: Diagnosis not present

## 2018-11-18 NOTE — Therapy (Signed)
[place] [address] [place], [address] Phone: [phone]   Fax:  [phone]  Physical Therapy Treatment  Patient Details  Name: Melanie Chavez MRN: [id number] Date of Birth: 1971/04/25 Referring Provider (PT): Philis Nettle   Encounter Date: 11/18/2018  PT End of Session - 11/18/18 1528    Visit Number  4    Number of Visits  13    Date for PT Re-Evaluation  12/16/18    PT Start Time  1500    PT Stop Time  1545    PT Time Calculation (min)  45 min    Activity Tolerance  Patient tolerated treatment well    Behavior During Therapy  [facility] for tasks assessed/performed       Past Medical History:  Diagnosis Date  . H/O pelvic inflammatory disease   . History of recurrent miscarriages, not currently pregnant   . Hypertension   . Obesity (BMI 30-39.9)   . Plantar fasciitis   . Reactive hypoglycemia 2011    Past Surgical History:  Procedure Laterality Date  . CHOLECYSTECTOMY  2007   [facility]  . GALLBLADDER SURGERY      There were no vitals filed for this visit.  Subjective Assessment - 11/18/18 1507    Subjective  Patient states she has been performing her HEP but is having increased raidaiting pain down her L hip and LE. Patient reports symptoms down her R LE is feeling much better.     Pertinent History  Patient reports increased pain along B hips and back starting 5/10, took prednisone which helped but did not abolish pain. Patient reports pain started 2 weeks ago    Limitations  Standing;Walking    How long can you sit comfortably?  unlimited    How long can you stand comfortably?  as soon as her stands    How long can you walk comfortably?  as soon as she begins    Diagnostic tests  none compeleted     Patient Stated Goals  To get rid of pain    Currently in Pain?  Yes    Pain Score  4     Pain Location  Hip    Pain Orientation  Left    Pain Descriptors / Indicators  Aching    Pain  Type  Acute pain    Pain Onset  More than a month ago    Pain Frequency  Intermittent       TREATMENT Therapeutic Exercise Prone press ups 3 x 6 reps with deep breathing at the top positioning Hip extension in standing - 2x10 B with UE support Hip flexor stretch in standing with a step for assistance -2  x 45 sec B SLUMP nerve tension in sitting - 2 x 20 B SLUMP flossing in sitting - x 20 Birddog in quadruped - x 2 , x 10 with solo LEs L LE SLS - x 20 with L rotations Hip hiking in standing off or airex pad - x 20  Ambulation with focus on performing greater hip IR - x 132ft  Patient demonstrates decreased pain after performing exercises focused on improving lumbar extension and focused on performing these motions to help decrease   PT Education - 11/18/18 1515    Education Details  form/technique with exercises; hip flexion stretch in standing    Person(s) Educated  Patient    Methods  Explanation;Demonstration    Comprehension  Verbalized understanding;Returned demonstration  PT Long Term Goals - 11/04/18 1659      PT LONG TERM GOAL #1   Title  Patient will be independent with HEP to continue benefits of therapy until discharge.    Baseline  dependent with form and technique    Time  6    Period  Weeks    Status  New    Target Date  11/04/18      PT LONG TERM GOAL #2   Title  Patient will be able to ambulate >1 hour to indicate improvement in lumbar function and improvement with ability to go to the grocery store.    Baseline  Onset of pain as soon as she stands    Time  6    Period  Weeks    Status  New    Target Date  12/16/18      PT LONG TERM GOAL #3   Title  Patient will be able to stand for over 1 hour without increase in pain to better be able to prepare food without requiring a sitting rest break    Baseline  Can stand for 63min with pain    Time  6    Period  Weeks    Status  New    Target Date  12/16/18            Plan - 11/18/18  1533    Clinical Impression Statement  Patient demonstrates increased L hip pain today with radiaiting sxs down into the knee, performed SLUMP tension activites and extension based motion to decrease pain and spasms. Patient demonstrates decreased pain at the end of the session indicating improvement in peripheral nerve irration along the L side. Patient will benefit from further skilled therapy focused on improving limitations to return to prior level of function.     Personal Factors and Comorbidities  Fitness;Comorbidity 1    Comorbidities  Obesity    Examination-Activity Limitations  Squat;Bend    Examination-Participation Restrictions  Laundry;Yard Work;Cleaning    Stability/Clinical Decision Making  Stable/Uncomplicated    Rehab Potential  Good    PT Frequency  2x / week    PT Duration  6 weeks    PT Treatment/Interventions  Therapeutic activities;Therapeutic exercise;Electrical Stimulation;Moist Heat;Ultrasound;Gait training;Stair training;Patient/family education;Neuromuscular re-education;Manual techniques;Dry needling;Passive range of motion;Spinal Manipulations;Joint Manipulations;Cryotherapy    PT Next Visit Plan  Continue to focus on nerve mobilization and extension based exercises    PT Home Exercise Plan  Nerve flossing, Cobra    Consulted and Agree with Plan of Care  Patient       Patient will benefit from skilled therapeutic intervention in order to improve the following deficits and impairments:  Abnormal gait, Decreased balance, Decreased strength, Decreased range of motion, Decreased activity tolerance, Difficulty walking, Decreased mobility, Increased muscle spasms, Pain, Postural dysfunction  Visit Diagnosis: Pain in left hip  Pain in right hip     Problem List Patient Active Problem List   Diagnosis Date Noted  . Insomnia 07/22/2014  . Snoring 05/03/2013  . Visit for preventive health examination 10/17/2011  . H/O pelvic inflammatory disease   . Reactive  hypoglycemia   . History of recurrent miscarriages, not currently pregnant   . Morbid obesity with BMI of 40.0-44.9, adult ([place]) 07/17/2011  . White coat syndrome with hypertension 07/17/2011    Blythe Stanford, PT DPT 11/18/2018, 3:45 PM  [place] [facility] [address] [place], [address] Phone: [phone]  Fax:  [phone]  Name: TORAH PINNOCK MRN: [id number] Date of Birth: 10-Dec-1970

## 2018-11-23 ENCOUNTER — Ambulatory Visit: Payer: 59

## 2018-11-23 ENCOUNTER — Other Ambulatory Visit: Payer: Self-pay

## 2018-11-23 DIAGNOSIS — M25552 Pain in left hip: Secondary | ICD-10-CM

## 2018-11-23 DIAGNOSIS — M25551 Pain in right hip: Secondary | ICD-10-CM

## 2018-11-23 DIAGNOSIS — M545 Low back pain, unspecified: Secondary | ICD-10-CM

## 2018-11-23 DIAGNOSIS — M62838 Other muscle spasm: Secondary | ICD-10-CM | POA: Diagnosis not present

## 2018-11-23 NOTE — Therapy (Signed)
[facility] [address] [place], [address] Phone: [phone]   Fax:  [phone]  Physical Therapy Treatment  Patient Details  Name: Melanie Chavez MRN: [id number] Date of Birth: 11-27-1970 Referring Provider (PT): Philis Nettle   Encounter Date: 11/23/2018  PT End of Session - 11/23/18 1518    Visit Number  5    Number of Visits  13    Date for PT Re-Evaluation  12/16/18    PT Start Time  1500    PT Stop Time  1545    PT Time Calculation (min)  45 min    Activity Tolerance  Patient tolerated treatment well    Behavior During Therapy  [facility] for tasks assessed/performed       Past Medical History:  Diagnosis Date  . H/O pelvic inflammatory disease   . History of recurrent miscarriages, not currently pregnant   . Hypertension   . Obesity (BMI 30-39.9)   . Plantar fasciitis   . Reactive hypoglycemia 2011    Past Surgical History:  Procedure Laterality Date  . CHOLECYSTECTOMY  2007   [facility]  . GALLBLADDER SURGERY      There were no vitals filed for this visit.  Subjective Assessment - 11/23/18 1508    Subjective  Patient states improvement overall and states she feels like she is improving.    Pertinent History  Patient reports increased pain along B hips and back starting 5/10, took prednisone which helped but did not abolish pain. Patient reports pain started 2 weeks ago    Limitations  Standing;Walking    How long can you sit comfortably?  unlimited    How long can you stand comfortably?  as soon as her stands    How long can you walk comfortably?  as soon as she begins    Diagnostic tests  none compeleted     Patient Stated Goals  To get rid of pain    Currently in Pain?  No/denies    Pain Onset  More than a month ago         TREATMENT Therapeutic Exercise Hip extension in standing - 2x10 B with UE support Lumbar rotation in standing with pole behind back - 2 x 10  Hip flexor  stretch in standing with a step for assistance -2  x 45 sec B Monster walks with GTB - 6 x 33ft Deadlift single leg with UE support - 2 x 5 B  Hip extension in standing with UE support - 3 x 5 B; 3 x5 B with YTB Hip abduction in standing with UE support - 3 x 5 B; 3 x 5 B with YTB Single leg balance on airex pad - x 10 with 5 sec holds Single leg balance on ground - x 10 with 5 sec holds Seated prayer stretch with physioball - x 10  Overhead back extension with physioball -- x 10 Back rotations with physioball -- x 10  Tandem balance on airex pad -- x 10    Patient demonstrates decreased pain after performing exercises focused on improving lumbar extension and focused on performing these motions to help decrease   PT Education - 11/23/18 1516    Education Details  form/technique with exercise    Person(s) Educated  Patient    Methods  Explanation;Demonstration    Comprehension  Verbalized understanding;Returned demonstration          PT Long Term Goals - 11/04/18 1659  PT LONG TERM GOAL #1   Title  Patient will be independent with HEP to continue benefits of therapy until discharge.    Baseline  dependent with form and technique    Time  6    Period  Weeks    Status  New    Target Date  11/04/18      PT LONG TERM GOAL #2   Title  Patient will be able to ambulate >1 hour to indicate improvement in lumbar function and improvement with ability to go to the grocery store.    Baseline  Onset of pain as soon as she stands    Time  6    Period  Weeks    Status  New    Target Date  12/16/18      PT LONG TERM GOAL #3   Title  Patient will be able to stand for over 1 hour without increase in pain to better be able to prepare food without requiring a sitting rest break    Baseline  Can stand for 61min with pain    Time  6    Period  Weeks    Status  New    Target Date  12/16/18            Plan - 11/23/18 1519    Clinical Impression Statement  Able to advance  exercises today as patient demosntrates less pain during today's session indicating functional carryover between sessions. Althouhg patient is improving, she continues to have signficant hip weakness which is contributing to her LBP. Patient will benefit from further skilled therapy to return to prior level of function.    Personal Factors and Comorbidities  Fitness;Comorbidity 1    Comorbidities  Obesity    Examination-Activity Limitations  Squat;Bend    Examination-Participation Restrictions  Laundry;Yard Work;Cleaning    Stability/Clinical Decision Making  Stable/Uncomplicated    Rehab Potential  Good    PT Frequency  2x / week    PT Duration  6 weeks    PT Treatment/Interventions  Therapeutic activities;Therapeutic exercise;Electrical Stimulation;Moist Heat;Ultrasound;Gait training;Stair training;Patient/family education;Neuromuscular re-education;Manual techniques;Dry needling;Passive range of motion;Spinal Manipulations;Joint Manipulations;Cryotherapy    PT Next Visit Plan  Continue to focus on nerve mobilization and extension based exercises    PT Home Exercise Plan  Nerve flossing, Cobra    Consulted and Agree with Plan of Care  Patient       Patient will benefit from skilled therapeutic intervention in order to improve the following deficits and impairments:  Abnormal gait, Decreased balance, Decreased strength, Decreased range of motion, Decreased activity tolerance, Difficulty walking, Decreased mobility, Increased muscle spasms, Pain, Postural dysfunction  Visit Diagnosis: Pain in left hip  Pain in right hip  Acute bilateral low back pain, unspecified whether sciatica present     Problem List Patient Active Problem List   Diagnosis Date Noted  . Insomnia 07/22/2014  . Snoring 05/03/2013  . Visit for preventive health examination 10/17/2011  . H/O pelvic inflammatory disease   . Reactive hypoglycemia   . History of recurrent miscarriages, not currently pregnant   .  Morbid obesity with BMI of 40.0-44.9, adult ([place]) 07/17/2011  . White coat syndrome with hypertension 07/17/2011    Blythe Stanford, PT DPT 11/23/2018, 3:30 PM  [place] [facility] [address] [place], [address] Phone: [phone]   Fax:  [phone]  Name: Melanie Chavez MRN: [id number] Date of Birth: 30-Jan-1971

## 2018-11-25 ENCOUNTER — Other Ambulatory Visit: Payer: Self-pay

## 2018-11-25 ENCOUNTER — Ambulatory Visit: Payer: 59

## 2018-11-25 DIAGNOSIS — M62838 Other muscle spasm: Secondary | ICD-10-CM | POA: Diagnosis not present

## 2018-11-25 DIAGNOSIS — M25551 Pain in right hip: Secondary | ICD-10-CM

## 2018-11-25 DIAGNOSIS — M25552 Pain in left hip: Secondary | ICD-10-CM | POA: Diagnosis not present

## 2018-11-25 DIAGNOSIS — M545 Low back pain, unspecified: Secondary | ICD-10-CM

## 2018-11-25 NOTE — Therapy (Signed)
[place] [facility] [address] [place], [address] Phone: [phone]   Fax:  [phone]  Physical Therapy Treatment  Patient Details  Name: Melanie Chavez MRN: [id number] Date of Birth: 1970/07/22 Referring Provider (PT): Philis Nettle   Encounter Date: 11/25/2018  PT End of Session - 11/25/18 1509    Visit Number  6    Number of Visits  13    Date for PT Re-Evaluation  12/16/18    PT Start Time  1500    PT Stop Time  1545    PT Time Calculation (min)  45 min    Activity Tolerance  Patient tolerated treatment well    Behavior During Therapy  [facility] for tasks assessed/performed       Past Medical History:  Diagnosis Date  . H/O pelvic inflammatory disease   . History of recurrent miscarriages, not currently pregnant   . Hypertension   . Obesity (BMI 30-39.9)   . Plantar fasciitis   . Reactive hypoglycemia 2011    Past Surgical History:  Procedure Laterality Date  . CHOLECYSTECTOMY  2007   [facility]  . GALLBLADDER SURGERY      There were no vitals filed for this visit.  Subjective Assessment - 11/25/18 1507    Subjective  Patient state's she is feeling 'good' today and states she is improving overall.    Pertinent History  Patient reports increased pain along B hips and back starting 5/10, took prednisone which helped but did not abolish pain. Patient reports pain started 2 weeks ago    Limitations  Standing;Walking    How long can you sit comfortably?  unlimited    How long can you stand comfortably?  as soon as her stands    How long can you walk comfortably?  as soon as she begins    Diagnostic tests  none compeleted     Patient Stated Goals  To get rid of pain    Currently in Pain?  No/denies    Pain Onset  More than a month ago          Therapeutic Exercise Hip extension in standing - 2x10 B with UE support YTB Lumbar rotation in standing with pole behind back - 2 x 10  Overhead back  extension with PVC pipe -- 2x 10 Hip abduction circle with hip abduction with YTB - 2 x 10 Hip flexor stretch in standing with a step for assistance -2  x 45 sec B Hip abduction with YTB in standing - 2 x15 Seated prayer stretch with physioball - 2 x 10  Running man with UE support in standing - 2 x 10 Monster walks with YTB - 6 x 63ft   Patient demonstrates decreased pain after performing exercises focused on improving lumbar extension and focused on performing these motions to help decrease   PT Education - 11/25/18 1509    Education Details  form/technique with exercise    Person(s) Educated  Patient    Methods  Explanation;Demonstration    Comprehension  Verbalized understanding;Returned demonstration          PT Long Term Goals - 11/04/18 1659      PT LONG TERM GOAL #1   Title  Patient will be independent with HEP to continue benefits of therapy until discharge.    Baseline  dependent with form and technique    Time  6    Period  Weeks    Status  New    Target Date  11/04/18      PT LONG TERM GOAL #2   Title  Patient will be able to ambulate >1 hour to indicate improvement in lumbar function and improvement with ability to go to the grocery store.    Baseline  Onset of pain as soon as she stands    Time  6    Period  Weeks    Status  New    Target Date  12/16/18      PT LONG TERM GOAL #3   Title  Patient will be able to stand for over 1 hour without increase in pain to better be able to prepare food without requiring a sitting rest break    Baseline  Can stand for 32min with pain    Time  6    Period  Weeks    Status  New    Target Date  12/16/18            Plan - 11/25/18 1532    Clinical Impression Statement  Patient is making signifciant progress towards long term goals. She is able to perform greater amount of exercises with less fatigue and with more resistance. Although patient is improving, she continues to have singificant hip wekaness and patient  will benefit from further skilled therapy to reutrn to prior level of function.    Personal Factors and Comorbidities  Fitness;Comorbidity 1    Comorbidities  Obesity    Examination-Activity Limitations  Squat;Bend    Examination-Participation Restrictions  Laundry;Yard Work;Cleaning    Stability/Clinical Decision Making  Stable/Uncomplicated    Rehab Potential  Good    PT Frequency  2x / week    PT Duration  6 weeks    PT Treatment/Interventions  Therapeutic activities;Therapeutic exercise;Electrical Stimulation;Moist Heat;Ultrasound;Gait training;Stair training;Patient/family education;Neuromuscular re-education;Manual techniques;Dry needling;Passive range of motion;Spinal Manipulations;Joint Manipulations;Cryotherapy    PT Next Visit Plan  Continue to focus on nerve mobilization and extension based exercises    PT Home Exercise Plan  Nerve flossing, Cobra    Consulted and Agree with Plan of Care  Patient       Patient will benefit from skilled therapeutic intervention in order to improve the following deficits and impairments:  Abnormal gait, Decreased balance, Decreased strength, Decreased range of motion, Decreased activity tolerance, Difficulty walking, Decreased mobility, Increased muscle spasms, Pain, Postural dysfunction  Visit Diagnosis: 1. Pain in left hip   2. Pain in right hip   3. Acute bilateral low back pain, unspecified whether sciatica present        Problem List Patient Active Problem List   Diagnosis Date Noted  . Insomnia 07/22/2014  . Snoring 05/03/2013  . Visit for preventive health examination 10/17/2011  . H/O pelvic inflammatory disease   . Reactive hypoglycemia   . History of recurrent miscarriages, not currently pregnant   . Morbid obesity with BMI of 40.0-44.9, adult ([place]) 07/17/2011  . White coat syndrome with hypertension 07/17/2011    Blythe Stanford, PT DPT 11/25/2018, 3:42 PM  [place] [facility] [address] [place], [address] Phone: [phone]   Fax:  [phone]  Name: Melanie Chavez MRN: [id number] Date of Birth: June 17, 1970

## 2018-11-30 ENCOUNTER — Ambulatory Visit: Payer: 59

## 2018-11-30 ENCOUNTER — Other Ambulatory Visit: Payer: Self-pay

## 2018-11-30 DIAGNOSIS — M25552 Pain in left hip: Secondary | ICD-10-CM

## 2018-11-30 DIAGNOSIS — M545 Low back pain, unspecified: Secondary | ICD-10-CM

## 2018-11-30 DIAGNOSIS — M25551 Pain in right hip: Secondary | ICD-10-CM | POA: Diagnosis not present

## 2018-11-30 DIAGNOSIS — M62838 Other muscle spasm: Secondary | ICD-10-CM | POA: Diagnosis not present

## 2018-11-30 NOTE — Therapy (Signed)
[place] [facility] [address] [place], [address] Phone: [phone]   Fax:  [phone]  Physical Therapy Treatment  Patient Details  Name: Melanie Chavez MRN: [id number] Date of Birth: 1971/03/13 Referring Provider (PT): Philis Nettle   Encounter Date: 11/30/2018  PT End of Session - 11/30/18 1522    Visit Number  7    Number of Visits  13    Date for PT Re-Evaluation  12/16/18    PT Start Time  1500    PT Stop Time  1545    PT Time Calculation (min)  45 min    Activity Tolerance  Patient tolerated treatment well    Behavior During Therapy  [facility] for tasks assessed/performed       Past Medical History:  Diagnosis Date  . H/O pelvic inflammatory disease   . History of recurrent miscarriages, not currently pregnant   . Hypertension   . Obesity (BMI 30-39.9)   . Plantar fasciitis   . Reactive hypoglycemia 2011    Past Surgical History:  Procedure Laterality Date  . CHOLECYSTECTOMY  2007   [facility]  . GALLBLADDER SURGERY      There were no vitals filed for this visit.  Subjective Assessment - 11/30/18 1521    Subjective  Patient reports no pain but continues to wake up with intermittent numbness along her affected side.    Pertinent History  Patient reports increased pain along B hips and back starting 5/10, took prednisone which helped but did not abolish pain. Patient reports pain started 2 weeks ago    Limitations  Standing;Walking    How long can you sit comfortably?  unlimited    How long can you stand comfortably?  as soon as her stands    How long can you walk comfortably?  as soon as she begins    Diagnostic tests  none compeleted     Patient Stated Goals  To get rid of pain    Currently in Pain?  No/denies    Pain Onset  More than a month ago         Therapeutic Exercise Hip extension in standing - 2x10 B with UE support YTB Lumbar rotation in standing with pole behind back - 2 x 10   Overhead back extension with PVC pipe -- x 10 CKC lumbar extension in standing - x 20  Hip swings laterally with UE support - x 20  Twist and pivot in standing - x 20 performed quickly Rotational lifts with physioball - 3 x 3 B  Lumbar twists in standing - x10 with physioball  Hip abduction with RTB in standing - 2 x15 Hip extension with RTB in standing - 2 x15 Single leg rotations in standing with intermittent UE support - 4 x 10sec B LEs Seated prayer stretch with physioball - 2 x 10  Running man with UE support in standing - 2 x 10 Hip flexor stretch in standing with a step for assistance -2  x 45 sec B    Patient demonstrates decreased pain after performing exercises focused on improving lumbar extension and focused on performing these motions to help decrease  PT Education - 11/30/18 1522    Education Details  form/technique with exercise    Person(s) Educated  Patient    Methods  Explanation;Demonstration    Comprehension  Verbalized understanding;Returned demonstration          PT Long Term Goals - 11/04/18 1659  PT LONG TERM GOAL #1   Title  Patient will be independent with HEP to continue benefits of therapy until discharge.    Baseline  dependent with form and technique    Time  6    Period  Weeks    Status  New    Target Date  11/04/18      PT LONG TERM GOAL #2   Title  Patient will be able to ambulate >1 hour to indicate improvement in lumbar function and improvement with ability to go to the grocery store.    Baseline  Onset of pain as soon as she stands    Time  6    Period  Weeks    Status  New    Target Date  12/16/18      PT LONG TERM GOAL #3   Title  Patient will be able to stand for over 1 hour without increase in pain to better be able to prepare food without requiring a sitting rest break    Baseline  Can stand for 59min with pain    Time  6    Period  Weeks    Status  New    Target Date  12/16/18            Plan - 11/30/18 1524     Clinical Impression Statement  Patient continues to make progress and was able to perform a greater amount of exercises compared to previou sessions indicating functional carryover between sessions. Although patient is improving, she continues to demonstrate increased fatigue and weakness along the hips B and will benefit from further skilled therapy to prior level of function.    Personal Factors and Comorbidities  Fitness;Comorbidity 1    Comorbidities  Obesity    Examination-Activity Limitations  Squat;Bend    Examination-Participation Restrictions  Laundry;Yard Work;Cleaning    Stability/Clinical Decision Making  Stable/Uncomplicated    Rehab Potential  Good    PT Frequency  2x / week    PT Duration  6 weeks    PT Treatment/Interventions  Therapeutic activities;Therapeutic exercise;Electrical Stimulation;Moist Heat;Ultrasound;Gait training;Stair training;Patient/family education;Neuromuscular re-education;Manual techniques;Dry needling;Passive range of motion;Spinal Manipulations;Joint Manipulations;Cryotherapy    PT Next Visit Plan  Continue to focus on nerve mobilization and extension based exercises    PT Home Exercise Plan  Nerve flossing, Cobra    Consulted and Agree with Plan of Care  Patient       Patient will benefit from skilled therapeutic intervention in order to improve the following deficits and impairments:  Abnormal gait, Decreased balance, Decreased strength, Decreased range of motion, Decreased activity tolerance, Difficulty walking, Decreased mobility, Increased muscle spasms, Pain, Postural dysfunction  Visit Diagnosis: 1. Pain in left hip   2. Pain in right hip   3. Acute bilateral low back pain, unspecified whether sciatica present        Problem List Patient Active Problem List   Diagnosis Date Noted  . Insomnia 07/22/2014  . Snoring 05/03/2013  . Visit for preventive health examination 10/17/2011  . H/O pelvic inflammatory disease   . Reactive  hypoglycemia   . History of recurrent miscarriages, not currently pregnant   . Morbid obesity with BMI of 40.0-44.9, adult ([place]) 07/17/2011  . White coat syndrome with hypertension 07/17/2011    Blythe Stanford, PT DPT 11/30/2018, 3:29 PM  [place] [facility] [address] [place], [address] Phone: [phone]   Fax:  [phone]  Name: Melanie Chavez  MRN: [id number] Date of Birth: 06/22/1970

## 2018-12-02 ENCOUNTER — Other Ambulatory Visit: Payer: Self-pay

## 2018-12-02 ENCOUNTER — Ambulatory Visit: Payer: 59

## 2018-12-02 DIAGNOSIS — M25551 Pain in right hip: Secondary | ICD-10-CM | POA: Diagnosis not present

## 2018-12-02 DIAGNOSIS — M545 Low back pain, unspecified: Secondary | ICD-10-CM

## 2018-12-02 DIAGNOSIS — M25552 Pain in left hip: Secondary | ICD-10-CM

## 2018-12-02 DIAGNOSIS — M62838 Other muscle spasm: Secondary | ICD-10-CM | POA: Diagnosis not present

## 2018-12-02 NOTE — Therapy (Signed)
[facility] [address], [place], [address] Phone: [phone]   Fax:  [phone]  Physical Therapy Treatment  Patient Details  Name: Melanie Chavez MRN: [id number] Date of Birth: 02/05/1971 Referring Provider (PT): Philis Nettle   Encounter Date: 12/02/2018  PT End of Session - 12/02/18 1519    Visit Number  8    Number of Visits  13    Date for PT Re-Evaluation  12/16/18    PT Start Time  1500    PT Stop Time  1545    PT Time Calculation (min)  45 min    Activity Tolerance  Patient tolerated treatment well    Behavior During Therapy  [facility] for tasks assessed/performed       Past Medical History:  Diagnosis Date  . H/O pelvic inflammatory disease   . History of recurrent miscarriages, not currently pregnant   . Hypertension   . Obesity (BMI 30-39.9)   . Plantar fasciitis   . Reactive hypoglycemia 2011    Past Surgical History:  Procedure Laterality Date  . CHOLECYSTECTOMY  2007   [facility]  . GALLBLADDER SURGERY      There were no vitals filed for this visit.  Subjective Assessment - 12/02/18 1506    Subjective  Patient reports she is improving overall and continues to demonstrate decreased pain from the previous session.    Pertinent History  Patient reports increased pain along B hips and back starting 5/10, took prednisone which helped but did not abolish pain. Patient reports pain started 2 weeks ago    Limitations  Standing;Walking    How long can you sit comfortably?  unlimited    How long can you stand comfortably?  as soon as her stands    How long can you walk comfortably?  as soon as she begins    Diagnostic tests  none compeleted     Patient Stated Goals  To get rid of pain    Currently in Pain?  No/denies    Pain Onset  More than a month ago       TREATMENT   Therapeutic Exercise Lumbar rotation in standing with pole behind back - 2 x 10  Overhead back extension with PVC  pipe -- x 10 CKC lumbar extension in standing - 2x 20  Hip swings laterally/forward & backward with UE support - x 20  Hip flexor stretch in standing with a step for assistance -2  x 45 sec B Lumbar extension and rotation with physioball - x 20  Rotational lifts with physioball - 3 x 3 B  Lumbar twists in standing - x10 with physioball  Hip circles with RTB in standing - 3 x 7 B Monster walks with RTB around knees - 15ft x 3  Hip extension with RTB in standing - 2 x15 Squats in standing with RTB in standing - 2 x 8  Seated prayer stretch with physioball - 2 x 10  Running man with UE support in standing - 2 x 10   Patient demonstrates decreased pain after performing exercises focused on improving lumbar extension and focused on performing these motions to help decrease  PT Education - 12/02/18 1518    Education Details  form/technique with exercise    Person(s) Educated  Patient    Methods  Explanation;Demonstration    Comprehension  Verbalized understanding;Returned demonstration          PT Long Term Goals - 11/04/18  [place] #1   Title  Patient will be independent with HEP to continue benefits of therapy until discharge.    Baseline  dependent with form and technique    Time  6    Period  Weeks    Status  New    [place] Date  11/04/18      PT LONG TERM GOAL #2   Title  Patient will be able to ambulate >1 hour to indicate improvement in lumbar function and improvement with ability to go to the grocery store.    Baseline  Onset of pain as soon as she stands    Time  6    Period  Weeks    Status  New    [place] Date  12/16/18      PT LONG TERM GOAL #3   Title  Patient will be able to stand for over 1 hour without increase in pain to better be able to prepare food without requiring a sitting rest break    Baseline  Can stand for 1min with pain    Time  6    Period  Weeks    Status  New    Target Date  12/16/18            Plan - 12/02/18 1525     Clinical Impression Statement  Patient demonstrates increased hip fatigue with performing hip exercises but is able to perform greater amount of exercises before onset of fatigue indicating functional carryover between sessions and improvement in hip/ lumbar strength. Patient demonstrates improvement with technique with exercise requiring less cueing to perform exercise with correct form. Patient continues to demonstrate difficulty with squatting motions limiting lift ability. Patient will benefit from further skilled therapy to return to prior level of function.    Personal Factors and Comorbidities  Fitness;Comorbidity 1    Comorbidities  Obesity    Examination-Activity Limitations  Squat;Bend    Examination-Participation Restrictions  Laundry;Yard Work;Cleaning    Stability/Clinical Decision Making  Stable/Uncomplicated    Rehab Potential  Good    PT Frequency  2x / week    PT Duration  6 weeks    PT Treatment/Interventions  Therapeutic activities;Therapeutic exercise;Electrical Stimulation;Moist Heat;Ultrasound;Gait training;Stair training;Patient/family education;Neuromuscular re-education;Manual techniques;Dry needling;Passive range of motion;Spinal Manipulations;Joint Manipulations;Cryotherapy    PT Next Visit Plan  Continue to focus on nerve mobilization and extension based exercises    PT Home Exercise Plan  Nerve flossing, Cobra    Consulted and Agree with Plan of Care  Patient       Patient will benefit from skilled therapeutic intervention in order to improve the following deficits and impairments:  Abnormal gait, Decreased balance, Decreased strength, Decreased range of motion, Decreased activity tolerance, Difficulty walking, Decreased mobility, Increased muscle spasms, Pain, Postural dysfunction  Visit Diagnosis: 1. Pain in left hip   2. Pain in right hip   3. Acute bilateral low back pain, unspecified whether sciatica present        Problem List Patient Active Problem  List   Diagnosis Date Noted  . Insomnia 07/22/2014  . Snoring 05/03/2013  . Visit for preventive health examination 10/17/2011  . H/O pelvic inflammatory disease   . Reactive hypoglycemia   . History of recurrent miscarriages, not currently pregnant   . Morbid obesity with BMI of 40.0-44.9, adult ([place]) 07/17/2011  . White coat syndrome with hypertension 07/17/2011    Blythe Stanford, PT DPT 12/02/2018, 3:49 PM  [facility] [address] [place], [address] Phone: [phone]   Fax:  [phone]  Name: Melanie Chavez MRN: [id number] Date of Birth: [birth year]

## 2018-12-07 ENCOUNTER — Ambulatory Visit: Payer: 59

## 2018-12-07 ENCOUNTER — Other Ambulatory Visit: Payer: Self-pay

## 2018-12-07 DIAGNOSIS — M25552 Pain in left hip: Secondary | ICD-10-CM | POA: Diagnosis not present

## 2018-12-07 DIAGNOSIS — M545 Low back pain, unspecified: Secondary | ICD-10-CM

## 2018-12-07 DIAGNOSIS — M25551 Pain in right hip: Secondary | ICD-10-CM | POA: Diagnosis not present

## 2018-12-07 DIAGNOSIS — M62838 Other muscle spasm: Secondary | ICD-10-CM | POA: Diagnosis not present

## 2018-12-07 NOTE — Therapy (Signed)
[facility] [address] [place], [address] Phone: [phone]   Fax:  [phone]  Physical Therapy Treatment/ Discharge summary  Patient Details  Name: Melanie Chavez MRN: [id number] Date of Birth: 05-Jul-1970 Referring Provider (PT): Philis Nettle   Encounter Date: 12/07/2018  PT End of Session - 12/07/18 1547    Visit Number  9    Number of Visits  13    Date for PT Re-Evaluation  12/16/18    PT Start Time  1500    PT Stop Time  1530    PT Time Calculation (min)  30 min    Activity Tolerance  Patient tolerated treatment well    Behavior During Therapy  [facility] for tasks assessed/performed       Past Medical History:  Diagnosis Date  . H/O pelvic inflammatory disease   . History of recurrent miscarriages, not currently pregnant   . Hypertension   . Obesity (BMI 30-39.9)   . Plantar fasciitis   . Reactive hypoglycemia 2011    Past Surgical History:  Procedure Laterality Date  . CHOLECYSTECTOMY  2007   [facility]  . GALLBLADDER SURGERY      There were no vitals filed for this visit.  Subjective Assessment - 12/07/18 1545    Subjective  Patient reports she had not had increased pain in 2 weeks and is prepared to be discharged.    Pertinent History  Patient reports increased pain along B hips and back starting 5/10, took prednisone which helped but did not abolish pain. Patient reports pain started 2 weeks ago    Limitations  Standing;Walking    How long can you sit comfortably?  unlimited    How long can you stand comfortably?  as soon as her stands    How long can you walk comfortably?  as soon as she begins    Diagnostic tests  none compeleted     Patient Stated Goals  To get rid of pain    Currently in Pain?  No/denies    Pain Onset  More than a month ago       Therapeutic Exercise Lumbar rotation in standing with pole behind back - 2 x 10  CKC lumbar extension in standing - 2x 20  Hip  swings laterally/forward & backward with UE support - x 20  Hip flexor stretch in standing with a step for assistance -2  x 45 sec B Rotational lifts with physioball - 3 x 3 B 4# Hip abduction in standing -- x 10  Monster walks with RTB around knees - 73f x 3  Hip extension with RTB in standing - 2 x15     Patient demonstrates decreased pain after performing exercises focused on improving lumbar extension and focused on performing these motions to help decrease   PT Education - 12/07/18 1547    Education Details  form/technique with exercise; Updated HEP - reviewed EXS    Person(s) Educated  Patient    Methods  Explanation;Demonstration;Handout    Comprehension  Verbalized understanding;Returned demonstration          PT Long Term Goals - 12/07/18 1550      PT LONG TERM GOAL #1   Title  Patient will be independent with HEP to continue benefits of therapy until discharge.    Baseline  dependent with form and technique; 12/07/18: independent form/technique    Time  6    Period  Weeks  Status  Achieved      PT LONG TERM GOAL #2   Title  Patient will be able to ambulate >1 hour to indicate improvement in lumbar function and improvement with ability to go to the grocery store.    Baseline  Onset of pain as soon as she stands; 12/07/2018: walking no longer aggravates her pain    Time  6    Period  Weeks    Status  Achieved      PT LONG TERM GOAL #3   Title  Patient will be able to stand for over 1 hour without increase in pain to better be able to prepare food without requiring a sitting rest break    Baseline  Can stand for 52mn with pain; 12/07/2018: standing no longer aggravates her pain    Time  6    Period  Weeks    Status  Achieved            Plan - 12/07/18 1548    Clinical Impression Statement  Patient demonstrates improvement with exercise form/technique today requiring little to no cueing to perform exercises correctly. Patient also demonstrates improvement  with ability to perform more functional activities around her home with overall less pain. Patient has met all long term goals and will benefit from further skilled therapy to return to prior level of function.    Personal Factors and Comorbidities  Fitness;Comorbidity 1    Comorbidities  Obesity    Examination-Activity Limitations  Squat;Bend    Examination-Participation Restrictions  Laundry;Yard Work;Cleaning    Stability/Clinical Decision Making  Stable/Uncomplicated    Rehab Potential  Good    PT Frequency  2x / week    PT Duration  6 weeks    PT Treatment/Interventions  Therapeutic activities;Therapeutic exercise;Electrical Stimulation;Moist Heat;Ultrasound;Gait training;Stair training;Patient/family education;Neuromuscular re-education;Manual techniques;Dry needling;Passive range of motion;Spinal Manipulations;Joint Manipulations;Cryotherapy    PT Next Visit Plan  Continue to focus on nerve mobilization and extension based exercises    PT Home Exercise Plan  Nerve flossing, Cobra    Consulted and Agree with Plan of Care  Patient       Patient will benefit from skilled therapeutic intervention in order to improve the following deficits and impairments:  Abnormal gait, Decreased balance, Decreased strength, Decreased range of motion, Decreased activity tolerance, Difficulty walking, Decreased mobility, Increased muscle spasms, Pain, Postural dysfunction  Visit Diagnosis: 1. Pain in left hip   2. Pain in right hip   3. Acute bilateral low back pain, unspecified whether sciatica present        Problem List Patient Active Problem List   Diagnosis Date Noted  . Insomnia 07/22/2014  . Snoring 05/03/2013  . Visit for preventive health examination 10/17/2011  . H/O pelvic inflammatory disease   . Reactive hypoglycemia   . History of recurrent miscarriages, not currently pregnant   . Morbid obesity with BMI of 40.0-44.9, [place] 07/17/2011  . White coat syndrome with  hypertension 07/17/2011    WBlythe Stanford PT DPT 12/07/2018, 3:51 PM  [facility] [address] [place] [address]Phone: [phone]  Fax:  [phone] Name: Melanie CALIXMRN: [id number] of Birth: 1Jan 06, 1972

## 2018-12-09 ENCOUNTER — Encounter: Payer: Self-pay | Admitting: Internal Medicine

## 2018-12-09 ENCOUNTER — Other Ambulatory Visit: Payer: Self-pay

## 2018-12-09 ENCOUNTER — Ambulatory Visit (INDEPENDENT_AMBULATORY_CARE_PROVIDER_SITE_OTHER): Payer: 59 | Admitting: Internal Medicine

## 2018-12-09 DIAGNOSIS — M5431 Sciatica, right side: Secondary | ICD-10-CM

## 2018-12-09 DIAGNOSIS — M5430 Sciatica, unspecified side: Secondary | ICD-10-CM | POA: Insufficient documentation

## 2018-12-09 DIAGNOSIS — I10 Essential (primary) hypertension: Secondary | ICD-10-CM | POA: Diagnosis not present

## 2018-12-09 DIAGNOSIS — Z7189 Other specified counseling: Secondary | ICD-10-CM | POA: Insufficient documentation

## 2018-12-09 DIAGNOSIS — Z6841 Body Mass Index (BMI) 40.0 and over, adult: Secondary | ICD-10-CM

## 2018-12-09 MED ORDER — METFORMIN HCL 500 MG PO TABS: 500.0000 mg | ORAL_TABLET | Freq: Every day | ORAL | 3 refills | Status: DC

## 2018-12-09 NOTE — Assessment & Plan Note (Signed)
Advised to have BP checked at the [facility] for monitoring .  Continue amlodipine losartan.hct and change metoprolol to evening dosing.

## 2018-12-09 NOTE — Progress Notes (Signed)
Virtual Visit via [email or web address]  This visit type was conducted due to national recommendations for restrictions regarding the COVID-19 pandemic (e.g. social distancing).  This format is felt to be most appropriate for this patient at this time.  All issues noted in this document were discussed and addressed.  No physical exam was performed (except for noted visual exam findings with Video Visits).   I connected with@ on 12/09/18 at  8:30 AM EDT by a video enabled telemedicine application or telephone and verified that I am speaking with the correct person using two identifiers. Location patient: home Location provider: work or home office Persons participating in the virtual visit: patient, provider  I discussed the limitations, risks, security and privacy concerns of performing an evaluation and management service by telephone and the availability of in person appointments. I also discussed with the patient that there may be a patient responsible charge related to this service. The patient expressed understanding and agreed to proceed.  Reason for visit: follow up on prediabetes with reactive hypoglycemia,  Morbid obesity and hypertension /insomnia   HPI:   48 yr old female with above history presents for follow up  The patient has no signs or symptoms of COVID 19 infection (fever, cough, sore throat  or shortness of breath beyond what is typical for patient).  Patient denies contact with other persons with the above mentioned symptoms or with anyone confirmed to have COVID 19   HTN:  Not checking ,   Taking all 3  meds in the am .  Discussed rationale for evening dosing of metoprolol   Obesity:  "no comment"  Not weighing, exercise transiently stopped due to episode of sciatica seen May 11 and referred for PT now finished.  Discussed trial of metformin to curb appetite and she agrees  Hip pain  Resolved now,  Described bilateral leg pain no back pain.  The pain was Severe   R > L      ROS: See  pertinent positives and negatives per HPI.  Past Medical History:  Diagnosis Date  . H/O pelvic inflammatory disease   . History of recurrent miscarriages, not currently pregnant   . Hypertension   . Obesity (BMI 30-39.9)   . Plantar fasciitis   . Reactive hypoglycemia 2011    Past Surgical History:  Procedure Laterality Date  . CHOLECYSTECTOMY  2007   [facility]  . GALLBLADDER SURGERY      Family History  Problem Relation Age of Onset  . Diabetes Mother   . Heart disease Father        CAD tobacco   . Cancer Maternal Grandmother        throat CA (snuff)  . Heart disease Paternal Aunt   . Heart disease Paternal Uncle   . Birth defects Paternal Uncle   . Heart disease Paternal Grandfather   . Breast cancer Neg Hx     SOCIAL HX:  reports that she has never smoked. She has never used smokeless tobacco. No history on file for alcohol and drug.   Current Outpatient Medications:  .  amLODipine (NORVASC) 2.5 MG tablet, TAKE 1 TABLET BY MOUTH DAILY., Disp: 90 tablet, Rfl: 3 .  cyclobenzaprine (FLEXERIL) 5 MG tablet, Take 1 tablet (5 mg total) by mouth 3 (three) times daily as needed for muscle spasms., Disp: 30 tablet, Rfl: 1 .  losartan-hydrochlorothiazide (HYZAAR) 100-12.5 MG tablet, TAKE 1 TABLET BY MOUTH DAILY., Disp: 90 tablet, Rfl: 3 .  metoprolol succinate (TOPROL-XL) 50 MG  24 hr tablet, TAKE 1 TABLET BY MOUTH DAILY. TAKE WITH OR IMMEDIATELY FOLLOWING A MEAL., Disp: 90 tablet, Rfl: 3 .  zolpidem (AMBIEN) 5 MG tablet, TAKE 1 TABLET BY MOUTH ONCE DAILY AT BEDTIME AS NEEDED, Disp: 30 tablet, Rfl: 5 .  metFORMIN (GLUCOPHAGE) 500 MG tablet, Take 1 tablet (500 mg total) by mouth daily with breakfast., Disp: 90 tablet, Rfl: 3  Current Facility-Administered Medications:  .  triamcinolone acetonide (KENALOG) 10 MG/ML injection 10 mg, 10 mg, Other, Once, [place], [place], DPM .  triamcinolone acetonide (KENALOG-40) injection 20 mg, 20 mg, Other, Once, [place], [employer],  DPM  EXAM:  VITALS per patient if applicable:  GENERAL: alert, oriented, appears well and in no acute distress  HEENT: atraumatic, conjunttiva clear, no obvious abnormalities on inspection of external nose and ears  NECK: normal movements of the head and neck  LUNGS: on inspection no signs of respiratory distress, breathing rate appears normal, no obvious gross SOB, gasping or wheezing  CV: no obvious cyanosis  MS: moves all visible extremities without noticeable abnormality  PSYCH/NEURO: pleasant and cooperative, no obvious depression or anxiety, speech and thought processing grossly intact  ASSESSMENT AND PLAN:  Discussed the following assessment and plan:  Morbid obesity with BMI of 40.0-44.9, adult I have addressed  BMI and recommended wt loss of 10% of body weigh over the next 6 months using a low glycemic index diet, metformin for appetite suppression  and regular exercise a minimum of 5 days per week.    White coat syndrome with hypertension Advised to have BP checked at the [facility] for monitoring .  Continue amlodipine losartan.hct and change metoprolol to evening dosing.   Sciatica without back pain Resolved with steroids and PT.  No lumbar films on file.   Educated About Covid-19 Virus Infection Educated patient on the newly broadened list of signs and symptoms of COVID-19 infection and ways to avoid the viral infection including washing hands frequently with soap and water,  using hand sanitizer if unable to wash, avoiding touching face,  staying at home and limiting visitors,  and avoiding contact with people coming in and out of home.  Discussed the potential ineffectiveness of hand sanitizer if left in environments > 110 degrees (ie , the car).  Reminded patient to call office with questions/concerns.  The importance of continued social distancing was discussed today . Patient was screened for the development of any unsafe behaviors or habits that may have  developed as a result of the social impact of the virus , including alcohol abuse,  Domestic violence, tobacco abuse and overeating.       I discussed the assessment and treatment plan with the patient. The patient was provided an opportunity to ask questions and all were answered. The patient agreed with the plan and demonstrated an understanding of the instructions.   The patient was advised to call back or seek an in-person evaluation if the symptoms worsen or if the condition fails to improve as anticipated.  I provided 25  minutes of non-face-to-face time during this encounter.   [name], MD

## 2018-12-09 NOTE — Assessment & Plan Note (Signed)
I have addressed  BMI and recommended wt loss of 10% of body weigh over the next 6 months using a low glycemic index diet, metformin for appetite suppression  and regular exercise a minimum of 5 days per week.

## 2018-12-09 NOTE — Assessment & Plan Note (Signed)
Resolved with steroids and PT.  No lumbar films on file.

## 2018-12-09 NOTE — Assessment & Plan Note (Signed)
Educated patient on the newly broadened list of signs and symptoms of COVID-19 infection and ways to avoid the viral infection including washing hands frequently with soap and water,  using hand sanitizer if unable to wash, avoiding touching face,  staying at home and limiting visitors,  and avoiding contact with people coming in and out of home.  Discussed the potential ineffectiveness of hand sanitizer if left in environments > 110 degrees (ie , the car).  Reminded patient to call office with questions/concerns.  The importance of continued social distancing was discussed today . Patient was screened for the development of any unsafe behaviors or habits that may have developed as a result of the social impact of the virus , including alcohol abuse,  Domestic violence, tobacco abuse and overeating.

## 2018-12-14 ENCOUNTER — Ambulatory Visit: Payer: 59

## 2018-12-31 ENCOUNTER — Other Ambulatory Visit: Payer: Self-pay

## 2018-12-31 ENCOUNTER — Encounter: Payer: Self-pay | Admitting: Internal Medicine

## 2018-12-31 ENCOUNTER — Telehealth (INDEPENDENT_AMBULATORY_CARE_PROVIDER_SITE_OTHER): Payer: 59 | Admitting: Internal Medicine

## 2018-12-31 VITALS — Ht 66.0 in | Wt 270.0 lb

## 2018-12-31 DIAGNOSIS — N926 Irregular menstruation, unspecified: Secondary | ICD-10-CM | POA: Diagnosis not present

## 2018-12-31 DIAGNOSIS — N921 Excessive and frequent menstruation with irregular cycle: Secondary | ICD-10-CM | POA: Insufficient documentation

## 2018-12-31 NOTE — Progress Notes (Signed)
Virtual Visit via [email or web address] This visit type was conducted due to national recommendations for restrictions regarding the COVID-19 pandemic (e.g. social distancing).  This format is felt to be most appropriate for this patient at this time.  All issues noted in this document were discussed and addressed.  No physical exam was performed (except for noted visual exam findings with Video Visits).   I connected with@ on 12/31/18 at  8:00 AM EDT by a video enabled telemedicine application  and verified that I am speaking with the correct person using two identifiers. Location patient: home Location provider: work or home office Persons participating in the virtual visit: patient, provider  I discussed the limitations, risks, security and privacy concerns of performing an evaluation and management service by telephone and the availability of in person appointments. I also discussed with the patient that there may be a patient responsible charge related to this service. The patient expressed understanding and agreed to proceed.  Reason for visit: metrorrhagia  HPI:  48 yr ol female with morbid obesity presents with 2 month history of having a menstraual period every 21 days or less has had 4 periods in the last 2 months each lasting at least 5 days,  .  No cramping,  But feels the bleeding is heavy for several days and feels excessively tired . Has not been sexually active in years .  Remote history of infertility and several miscarriages prior to a success full term pregnancy .  daughteris now 47 years old.  ROS: See pertinent positives and negatives per HPI.  Past Medical History:  Diagnosis Date  . H/O pelvic inflammatory disease   . History of recurrent miscarriages, not currently pregnant   . Hypertension   . Obesity (BMI 30-39.9)   . Plantar fasciitis   . Reactive hypoglycemia 2011    Past Surgical History:  Procedure Laterality Date  . CHOLECYSTECTOMY  2007   [place]  . GALLBLADDER  SURGERY      Family History  Problem Relation Age of Onset  . Diabetes Mother   . Heart disease Father        CAD tobacco   . Cancer Maternal Grandmother        throat CA (snuff)  . Heart disease Paternal Aunt   . Heart disease Paternal Uncle   . Birth defects Paternal Uncle   . Heart disease Paternal Grandfather   . Breast cancer Neg Hx     SOCIAL HX:  reports that she has never smoked. She has never used smokeless tobacco. No history on file for alcohol and drug.   Current Outpatient Medications:  .  amLODipine (NORVASC) 2.5 MG tablet, TAKE 1 TABLET BY MOUTH DAILY., Disp: 90 tablet, Rfl: 3 .  losartan-hydrochlorothiazide (HYZAAR) 100-12.5 MG tablet, TAKE 1 TABLET BY MOUTH DAILY., Disp: 90 tablet, Rfl: 3 .  metFORMIN (GLUCOPHAGE) 500 MG tablet, Take 1 tablet (500 mg total) by mouth daily with breakfast., Disp: 90 tablet, Rfl: 3 .  metoprolol succinate (TOPROL-XL) 50 MG 24 hr tablet, TAKE 1 TABLET BY MOUTH DAILY. TAKE WITH OR IMMEDIATELY FOLLOWING A MEAL., Disp: 90 tablet, Rfl: 3 .  zolpidem (AMBIEN) 5 MG tablet, TAKE 1 TABLET BY MOUTH ONCE DAILY AT BEDTIME AS NEEDED, Disp: 30 tablet, Rfl: 5 .  cyclobenzaprine (FLEXERIL) 5 MG tablet, Take 1 tablet (5 mg total) by mouth 3 (three) times daily as needed for muscle spasms., Disp: 30 tablet, Rfl: 1  Current Facility-Administered Medications:  .  triamcinolone acetonide (KENALOG) 10  MG/ML injection 10 mg, 10 mg, Other, Once, [place], Titorya, DPM .  triamcinolone acetonide (KENALOG-40) injection 20 mg, 20 mg, Other, Once, [place], Greer, DPM  EXAM:  VITALS per patient if applicable:  GENERAL:  alert, oriented, appears well and in no acute distress  HEENT: atraumatic, conjunttiva clear, no obvious abnormalities on inspection of external nose and ears.  HIRSUTISM noted on chin   NECK: normal movements of the head and neck  LUNGS: on inspection no signs of respiratory distress, breathing rate appears normal, no obvious gross SOB,  gasping or wheezing  CV: no obvious cyanosis  MS: moves all visible extremities without noticeable abnormality  PSYCH/NEURO: pleasant and cooperative, no obvious depression or anxiety, speech and thought processing grossly intact  ASSESSMENT AND PLAN:   Menstrual irregularity Ddx includes perimenopause of hyperandrogen state including PCOS given morbid obesity .  Screening labs ordered   Morbid obesity ([place]) Screening for diabetes, thyroid and hyperlipidemia. I have addressed  BMI and recommended a low glycemic index diet utilizing smaller more frequent meals to increase metabolism.  I have also recommended that patient start exercising with a goal of 30 minutes of aerobic exercise a minimum of 5 days per week.     I discussed the assessment and treatment plan with the patient. The patient was provided an opportunity to ask questions and all were answered. The patient agreed with the plan and demonstrated an understanding of the instructions.   The patient was advised to call back or seek an in-person evaluation if the symptoms worsen or if the condition fails to improve as anticipated.  I provided  25 minutes of non-face-to-face time during this encounter.   [name], MD

## 2018-12-31 NOTE — Assessment & Plan Note (Signed)
Screening for diabetes, thyroid and hyperlipidemia. I have addressed  BMI and recommended a low glycemic index diet utilizing smaller more frequent meals to increase metabolism.  I have also recommended that patient start exercising with a goal of 30 minutes of aerobic exercise a minimum of 5 days per week.

## 2018-12-31 NOTE — Assessment & Plan Note (Signed)
Ddx includes perimenopause of hyperandrogen state including PCOS given morbid obesity .  Screening labs ordered

## 2019-01-18 ENCOUNTER — Other Ambulatory Visit (INDEPENDENT_AMBULATORY_CARE_PROVIDER_SITE_OTHER): Payer: 59

## 2019-01-18 ENCOUNTER — Other Ambulatory Visit: Payer: Self-pay

## 2019-01-18 DIAGNOSIS — N926 Irregular menstruation, unspecified: Secondary | ICD-10-CM

## 2019-01-18 LAB — HEMOGLOBIN A1C: Hgb A1c MFr Bld: 5.7 % (ref 4.6–6.5)

## 2019-01-18 LAB — CBC WITH DIFFERENTIAL/PLATELET
Basophils Absolute: 0.1 10*3/uL (ref 0.0–0.1)
Basophils Relative: 1.2 % (ref 0.0–3.0)
Eosinophils Absolute: 0.1 10*3/uL (ref 0.0–0.7)
Eosinophils Relative: 2.5 % (ref 0.0–5.0)
HCT: 36.7 % (ref 36.0–46.0)
Hemoglobin: 12 g/dL (ref 12.0–15.0)
Lymphocytes Relative: 32.4 % (ref 12.0–46.0)
Lymphs Abs: 1.6 10*3/uL (ref 0.7–4.0)
MCHC: 32.7 g/dL (ref 30.0–36.0)
MCV: 86.4 fl (ref 78.0–100.0)
Monocytes Absolute: 0.6 10*3/uL (ref 0.1–1.0)
Monocytes Relative: 11.7 % (ref 3.0–12.0)
Neutro Abs: 2.6 10*3/uL (ref 1.4–7.7)
Neutrophils Relative %: 52.2 % (ref 43.0–77.0)
Platelets: 376 10*3/uL (ref 150.0–400.0)
RBC: 4.25 Mil/uL (ref 3.87–5.11)
RDW: 15.3 % (ref 11.5–15.5)
WBC: 4.9 10*3/uL (ref 4.0–10.5)

## 2019-01-18 LAB — LIPID PANEL
Cholesterol: 118 mg/dL (ref 0–200)
HDL: 41.9 mg/dL (ref >39.00)
LDL Cholesterol: 66 mg/dL (ref 0–99)
NonHDL: 76.29
Total CHOL/HDL Ratio: 3
Triglycerides: 49 mg/dL (ref 0.0–149.0)
VLDL: 9.8 mg/dL (ref 0.0–40.0)

## 2019-01-18 LAB — TSH: TSH: 1.49 u[IU]/mL (ref 0.35–4.50)

## 2019-01-18 LAB — FOLLICLE STIMULATING HORMONE: FSH: 3.2 m[IU]/mL

## 2019-01-18 NOTE — Addendum Note (Signed)
Addended by: Elpidio Galea T on: 01/18/2019 08:16 AM   Modules accepted: Orders

## 2019-01-20 ENCOUNTER — Other Ambulatory Visit: Payer: Self-pay

## 2019-01-20 LAB — TESTOSTERONE, FREE, TOTAL, SHBG
Sex Hormone Binding: 53 nmol/L (ref 24.6–122.0)
Testosterone, Free: 1.9 pg/mL (ref 0.0–4.2)
Testosterone: 19 ng/dL (ref 8–48)

## 2019-01-22 ENCOUNTER — Encounter: Payer: Self-pay | Admitting: Internal Medicine

## 2019-01-22 ENCOUNTER — Ambulatory Visit (INDEPENDENT_AMBULATORY_CARE_PROVIDER_SITE_OTHER): Payer: 59 | Admitting: Internal Medicine

## 2019-01-22 ENCOUNTER — Other Ambulatory Visit (HOSPITAL_COMMUNITY)
Admission: RE | Admit: 2019-01-22 | Discharge: 2019-01-22 | Disposition: A | Discharge status: Home / Self Care | Payer: 59 | Source: Ambulatory Visit | Attending: Internal Medicine | Admitting: Internal Medicine

## 2019-01-22 ENCOUNTER — Other Ambulatory Visit: Payer: Self-pay

## 2019-01-22 VITALS — BP 128/90 | HR 89 | Temp 95.0°F | Ht 65.0 in | Wt 268.6 lb

## 2019-01-22 DIAGNOSIS — I10 Essential (primary) hypertension: Secondary | ICD-10-CM | POA: Diagnosis not present

## 2019-01-22 DIAGNOSIS — Z0000 Encounter for general adult medical examination without abnormal findings: Secondary | ICD-10-CM

## 2019-01-22 DIAGNOSIS — Z124 Encounter for screening for malignant neoplasm of cervix: Secondary | ICD-10-CM

## 2019-01-22 DIAGNOSIS — F5101 Primary insomnia: Secondary | ICD-10-CM

## 2019-01-22 DIAGNOSIS — N921 Excessive and frequent menstruation with irregular cycle: Secondary | ICD-10-CM

## 2019-01-22 DIAGNOSIS — R2242 Localized swelling, mass and lump, left lower limb: Secondary | ICD-10-CM | POA: Diagnosis not present

## 2019-01-22 NOTE — Progress Notes (Signed)
Patient ID: Melanie Chavez, female    DOB: [birth year]  Age: 48 y.o. MRN: [id number]  The patient is here for annual physical  examination and management of other chronic and acute problems.   The risk factors are reflected in the social history.  The roster of all physicians providing medical care to patient - is listed in the Snapshot section of the chart.  Activities of daily living:  The patient is 100% independent in all ADLs: dressing, toileting, feeding as well as independent mobility  Home safety : The patient has smoke detectors in the home. They wear seatbelts.  There are no firearms at home. There is no violence in the home.   There is no risks for hepatitis, STDs or HIV. There is no   history of blood transfusion. They have no travel history to infectious disease endemic areas of the world.  The patient has seen their dentist in the last six month. The has not  seen their eye doctor in the last year. She denies  hearing difficulty with regard to whispered voices and some television programs.  They have deferred audiologic testing in the last year.  They do not  have excessive sun exposure. Discussed the need for sun protection: hats, long sleeves and use of sunscreen if there is significant sun exposure.   Diet: the importance of a healthy diet is discussed.  She has not been restricting her diet and has gained weight .  The benefits of regular aerobic exercise were discussed. She does not exercise regularly due to foot pain and lack of motivation   Depression screen: there are no signs or vegative symptoms of depression- irritability, change in appetite, anhedonia, sadness/tearfullness.   The following portions of the patient's history were reviewed and updated as appropriate: allergies, current medications, past family history, past medical history,  past surgical history, past social history  and problem list.  Visual acuity was not assessed per patient preference since she  has regular follow up with her ophthalmologist. Hearing and body mass index were assessed and reviewed.   During the course of the visit the patient was educated and counseled about appropriate screening and preventive services including : fall prevention , diabetes screening, nutrition counseling, colorectal cancer screening, and recommended immunizations.    CC: The primary encounter diagnosis was Screening for cervical cancer. Diagnoses of Visit for preventive health examination, [name], Primary insomnia, [name], and Subcutaneous mass of left foot were also pertinent to this visit.  Tender mass on dorsal arch of left foot.  Has been present for several weeks .  No history of trauma.   [name]:  She continues to have menses every 14 to 20 days . She denies heavy cramping but reports fatigue during menstrual periods. She is not sexually active and has not been in over a year.     History Melanie Chavez has a past medical history of H/O pelvic inflammatory disease, History of recurrent miscarriages, not currently pregnant, Hypertension, Obesity (BMI 30-39.9), Plantar fasciitis, and Reactive hypoglycemia (2011).   She has a past surgical history that includes Cholecystectomy (2007).   Her family history includes Birth defects in her paternal uncle; Cancer in her maternal grandmother; Diabetes in her mother; Heart disease in her father, paternal aunt, paternal grandfather, and paternal uncle.She reports that she has never smoked. She has never used smokeless tobacco. No history on file for alcohol and drug.  Outpatient Medications Prior to Visit  Medication Sig Dispense Refill  . amLODipine (  NORVASC) 2.5 MG tablet TAKE 1 TABLET BY MOUTH DAILY. 90 tablet 3  . losartan-hydrochlorothiazide (HYZAAR) 100-12.5 MG tablet TAKE 1 TABLET BY MOUTH DAILY. 90 tablet 3  . metFORMIN (GLUCOPHAGE) 500 MG tablet Take 1 tablet (500 mg total) by mouth daily with breakfast. 90 tablet 3  .  metoprolol succinate (TOPROL-XL) 50 MG 24 hr tablet TAKE 1 TABLET BY MOUTH DAILY. TAKE WITH OR IMMEDIATELY FOLLOWING A MEAL. 90 tablet 3  . zolpidem (AMBIEN) 5 MG tablet TAKE 1 TABLET BY MOUTH ONCE DAILY AT BEDTIME AS NEEDED 30 tablet 5  . cyclobenzaprine (FLEXERIL) 5 MG tablet Take 1 tablet (5 mg total) by mouth 3 (three) times daily as needed for muscle spasms. 30 tablet 1   Facility-Administered Medications Prior to Visit  Medication Dose Route Frequency Provider Last Rate Last Dose  . triamcinolone acetonide (KENALOG) 10 MG/ML injection 10 mg  10 mg Other Once [place], Titorya, DPM      . triamcinolone acetonide (KENALOG-40) injection 20 mg  20 mg Other Once Landis Martins, DPM        Review of Systems  Patient denies headache, fevers, malaise, unintentional weight loss, skin rash, eye pain, sinus congestion and sinus pain, sore throat, dysphagia,  hemoptysis , cough, dyspnea, wheezing, chest pain, palpitations, orthopnea, edema, abdominal pain, nausea, melena, diarrhea, constipation, flank pain, dysuria, hematuria, urinary  Frequency, nocturia, numbness, tingling, seizures,  Focal weakness, Loss of consciousness,  Tremor, insomnia, depression, anxiety, and suicidal ideation.     Objective:  BP 128/90 (BP Location: Left Arm, Patient Position: Sitting, Cuff Size: Large)   Pulse 89   Temp (!) 95 F (35 C) (Temporal)   Ht 5\' 5"  (1.651 m)   Wt 268 lb 9.6 oz (121.8 kg)   LMP 01/02/2019 (Within Days)   SpO2 98%   BMI 44.70 kg/m   Physical Exam   General Appearance:    Alert, cooperative, no distress, appears stated age  Head:    Normocephalic, without obvious abnormality, atraumatic  Eyes:    PERRL, conjunctiva/corneas clear, EOM's intact, fundi    benign, both eyes  Ears:    Normal TM's and external ear canals, both ears  Nose:   Nares normal, septum midline, mucosa normal, no drainage    or sinus tenderness  Throat:   Lips, mucosa, and tongue normal; teeth and gums normal  Neck:    Supple, symmetrical, trachea midline, no adenopathy;    thyroid:  no enlargement/tenderness/nodules; no carotid   bruit or JVD  Back:     Symmetric, no curvature, ROM normal, no CVA tenderness  Lungs:     Clear to auscultation bilaterally, respirations unlabored  Chest Wall:    No tenderness or deformity   Heart:    Regular rate and rhythm, S1 and S2 normal, no murmur, rub   or gallop  Breast Exam:    Pendulous breasts, No tenderness, masses, or nipple abnormality  Abdomen:     Soft, non-tender, bowel sounds active all four quadrants,    no masses, no organomegaly  Genitalia:    Pelvic: cervix normal in appearance, external genitalia normal, no adnexal masses or tenderness, no cervical motion tenderness, rectovaginal septum normal, uterus normal size, shape, and consistency and vagina normal without discharge  Extremities:   Extremities normal except for lima bean sized flesh subcutaneous mass on dorsum of left foot.  atraumatic, no cyanosis or edema  Pulses:   2+ and symmetric all extremities  Skin:   Skin color, texture, turgor normal,  no rashes or lesions  Lymph nodes:   Cervical, supraclavicular, and axillary nodes normal  Neurologic:   CNII-XII intact, normal strength, sensation and reflexes    throughout      Assessment & Plan:   Problem List Items Addressed This Visit      Unprioritized   Visit for preventive health examination    age appropriate education and counseling updated, referrals for preventative services and immunizations addressed, dietary and smoking counseling addressed, most recent labs reviewed.  I have personally reviewed and have noted:  1) the patient's medical and social history 2) The pt's use of alcohol, tobacco, and illicit drugs 3) The patient's current medications and supplements 4) Functional ability including ADL's, fall risk, home safety risk, hearing and visual impairment 5) Diet and physical activities 6) Evidence for depression or mood  disorder 7) The patient's height, weight, and BMI have been recorded in the chart  I have made referrals, and provided counseling and education based on review of the above      Insomnia    managed with ambien prn. The risks and benefits of hypnotics were reviewed with patient today including excessive sedation leading to respiratory depression,  impaired thinking/driving, and addiction.  Patient was advised to avoid concurrent use with alcohol, to use medication only as needed and not to share with others  .           [name]    Ddx includes perimenopause or hyperandrogen state including PCOS given morbid obesity .  Screening labs all normal.  OCPs C/I secondary to morbid obesity and increased risk of DVT/PE.  Referring to Dr Rubie Maid at [facility].       Relevant Orders   Ambulatory referral to Obstetrics / Gynecology   Morbid obesity [facility])    continue periodic Screening for diabetes, thyroid and hyperlipidemia. Continue metformin .  I have addressed  BMI and recommended a low glycemic index diet utilizing smaller more frequent meals to increase metabolism.  I have also recommended that patient start exercising with a goal of 30 minutes of aerobic exercise a minimum of 5 days per week.       Subcutaneous mass of left foot    Etiology unclear,  May be lipoma causing extrinsic compression of peripheral nerves.  Referral to [facility]       Other Visit Diagnoses    Screening for cervical cancer    -  Primary   Relevant Orders   Cytology - PAP( )      I am having Autumm L. Bernath maintain her metoprolol succinate, amLODipine, losartan-hydrochlorothiazide, zolpidem, cyclobenzaprine, and metFORMIN. We will continue to administer triamcinolone acetonide and triamcinolone acetonide.  No orders of the defined types were placed in this encounter.   There are no discontinued medications.  Follow-up: Return in about 6 months (around 07/25/2019).   [name], MD

## 2019-01-22 NOTE — Patient Instructions (Addendum)
I am making the following   Referral to [facility] for your left foot pain   Referral to [facility] for your menstrual irregularities    Health Maintenance, Female Adopting a healthy lifestyle and getting preventive care are important in promoting health and wellness. Ask your health care provider about:  The right schedule for you to have regular tests and exams.  Things you can do on your own to prevent diseases and keep yourself healthy. What should I know about diet, weight, and exercise? Eat a healthy diet   Eat a diet that includes plenty of vegetables, fruits, low-fat dairy products, and lean protein.  Do not eat a lot of foods that are high in solid fats, added sugars, or sodium. Maintain a healthy weight Body mass index (BMI) is used to identify weight problems. It estimates body fat based on height and weight. Your health care provider can help determine your BMI and help you achieve or maintain a healthy weight. Get regular exercise Get regular exercise. This is one of the most important things you can do for your health. Most adults should:  Exercise for at least 150 minutes each week. The exercise should increase your heart rate and make you sweat (moderate-intensity exercise).  Do strengthening exercises at least twice a week. This is in addition to the moderate-intensity exercise.  Spend less time sitting. Even light physical activity can be beneficial. Watch cholesterol and blood lipids Have your blood tested for lipids and cholesterol at 47 years of age, then have this test every 5 years. Have your cholesterol levels checked more often if:  Your lipid or cholesterol levels are high.  You are older than 47 years of age.  You are at high risk for heart disease. What should I know about cancer screening? Depending on your health history and family history, you may need to have cancer screening at various ages. This may include screening for:  Breast  cancer.  Cervical cancer.  Colorectal cancer.  Skin cancer.  Lung cancer. What should I know about heart disease, diabetes, and high blood pressure? Blood pressure and heart disease  High blood pressure causes heart disease and increases the risk of stroke. This is more likely to develop in people who have high blood pressure readings, are of African descent, or are overweight.  Have your blood pressure checked: ? Every 3-5 years if you are 47-74 years of age. ? Every year if you are 47 years old or older. Diabetes Have regular diabetes screenings. This checks your fasting blood sugar level. Have the screening done:  Once every three years after age 55 if you are at a normal weight and have a low risk for diabetes.  More often and at a younger age if you are overweight or have a high risk for diabetes. What should I know about preventing infection? Hepatitis B If you have a higher risk for hepatitis B, you should be screened for this virus. Talk with your health care provider to find out if you are at risk for hepatitis B infection. Hepatitis C Testing is recommended for:  Everyone born from [birth year] through 1964.  Anyone with known risk factors for hepatitis C. Sexually transmitted infections (STIs)  Get screened for STIs, including gonorrhea and chlamydia, if: ? You are sexually active and are younger than 47 years of age. ? You are older than 47 years of age and your health care provider tells you that you are at risk for this type  of infection. ? Your sexual activity has changed since you were last screened, and you are at increased risk for chlamydia or gonorrhea. Ask your health care provider if you are at risk.  Ask your health care provider about whether you are at high risk for HIV. Your health care provider may recommend a prescription medicine to help prevent HIV infection. If you choose to take medicine to prevent HIV, you should first get tested for HIV. You should  then be tested every 3 months for as long as you are taking the medicine. Pregnancy  If you are about to stop having your period (premenopausal) and you may become pregnant, seek counseling before you get pregnant.  Take 400 to 800 micrograms (mcg) of folic acid every day if you become pregnant.  Ask for birth control (contraception) if you want to prevent pregnancy. Osteoporosis and menopause Osteoporosis is a disease in which the bones lose minerals and strength with aging. This can result in bone fractures. If you are 61 years old or older, or if you are at risk for osteoporosis and fractures, ask your health care provider if you should:  Be screened for bone loss.  Take a calcium or vitamin D supplement to lower your risk of fractures.  Be given hormone replacement therapy (HRT) to treat symptoms of menopause. Follow these instructions at home: Lifestyle  Do not use any products that contain nicotine or tobacco, such as cigarettes, e-cigarettes, and chewing tobacco. If you need help quitting, ask your health care provider.  Do not use street drugs.  Do not share needles.  Ask your health care provider for help if you need support or information about quitting drugs. Alcohol use  Do not drink alcohol if: ? Your health care provider tells you not to drink. ? You are pregnant, may be pregnant, or are planning to become pregnant.  If you drink alcohol: ? Limit how much you use to 0-1 drink a day. ? Limit intake if you are breastfeeding.  Be aware of how much alcohol is in your drink. In the [place] one drink equals one 12 oz bottle of beer (355 mL), one 5 oz glass of wine (148 mL), or one 1 oz glass of hard liquor (44 mL). General instructions  Schedule regular health, dental, and eye exams.  Stay current with your vaccines.  Tell your health care provider if: ? You often feel depressed. ? You have ever been abused or do not feel safe at home. Summary  Adopting a  healthy lifestyle and getting preventive care are important in promoting health and wellness.  Follow your health care provider's instructions about healthy diet, exercising, and getting tested or screened for diseases.  Follow your health care provider's instructions on monitoring your cholesterol and blood pressure. This information is not intended to replace advice given to you by your health care provider. Make sure you discuss any questions you have with your health care provider. Document Released: 12/10/2010 Document Revised: 05/20/2018 Document Reviewed: 05/20/2018 Elsevier Patient Education  2020 [employer].

## 2019-01-24 ENCOUNTER — Encounter: Payer: Self-pay | Admitting: Internal Medicine

## 2019-01-24 DIAGNOSIS — R2242 Localized swelling, mass and lump, left lower limb: Secondary | ICD-10-CM | POA: Insufficient documentation

## 2019-01-24 DIAGNOSIS — M67479 Ganglion, unspecified ankle and foot: Secondary | ICD-10-CM | POA: Insufficient documentation

## 2019-01-24 MED ORDER — LOSARTAN POTASSIUM-HCTZ 100-25 MG PO TABS: 1.0000 | ORAL_TABLET | Freq: Every day | ORAL | 3 refills | Status: DC

## 2019-01-24 NOTE — Assessment & Plan Note (Signed)
Etiology unclear,  May be lipoma causing extrinsic compression of peripheral nerves.  Referral to [facility]

## 2019-01-24 NOTE — Assessment & Plan Note (Signed)
continue periodic Screening for diabetes, thyroid and hyperlipidemia. Continue metformin .  I have addressed  BMI and recommended a low glycemic index diet utilizing smaller more frequent meals to increase metabolism.  I have also recommended that patient start exercising with a goal of 30 minutes of aerobic exercise a minimum of 5 days per week.

## 2019-01-24 NOTE — Assessment & Plan Note (Signed)
Advised to have BP checked at the [facility] for monitoring .  Continue amlodipine losartan/hct and change metoprolol to evening dosing.

## 2019-01-24 NOTE — Assessment & Plan Note (Signed)
managed with ambien prn. The risks and benefits of hypnotics were reviewed with patient today including excessive sedation leading to respiratory depression,  impaired thinking/driving, and addiction.  Patient was advised to avoid concurrent use with alcohol, to use medication only as needed and not to share with others  .      

## 2019-01-24 NOTE — Assessment & Plan Note (Signed)
Ddx includes perimenopause or hyperandrogen state including PCOS given morbid obesity .  Screening labs all normal.  OCPs C/I secondary to morbid obesity and increased risk of DVT/PE.  Referring to Dr Rubie Maid at [facility].

## 2019-01-24 NOTE — Assessment & Plan Note (Signed)

## 2019-01-26 LAB — EXTRA LAV TOP TUBE

## 2019-01-26 LAB — 17-HYDROXYPROGESTERONE: 17-OH-Progesterone, LC/MS/MS: 137 ng/dL

## 2019-01-26 LAB — CYTOLOGY - PAP
Diagnosis: NEGATIVE
HPV: NOT DETECTED

## 2019-02-02 ENCOUNTER — Ambulatory Visit: Payer: 59 | Admitting: Podiatry

## 2019-02-09 ENCOUNTER — Other Ambulatory Visit: Payer: Self-pay

## 2019-02-09 ENCOUNTER — Ambulatory Visit (INDEPENDENT_AMBULATORY_CARE_PROVIDER_SITE_OTHER): Payer: 59

## 2019-02-09 ENCOUNTER — Ambulatory Visit (INDEPENDENT_AMBULATORY_CARE_PROVIDER_SITE_OTHER): Payer: 59 | Admitting: Podiatry

## 2019-02-09 ENCOUNTER — Encounter: Payer: Self-pay | Admitting: Podiatry

## 2019-02-09 DIAGNOSIS — M67472 Ganglion, left ankle and foot: Secondary | ICD-10-CM

## 2019-02-09 DIAGNOSIS — M7989 Other specified soft tissue disorders: Secondary | ICD-10-CM

## 2019-02-09 MED ORDER — METHYLPREDNISOLONE 4 MG PO TBPK: ORAL_TABLET | ORAL | 0 refills | Status: DC

## 2019-02-11 ENCOUNTER — Ambulatory Visit (INDEPENDENT_AMBULATORY_CARE_PROVIDER_SITE_OTHER): Payer: 59 | Admitting: Obstetrics and Gynecology

## 2019-02-11 ENCOUNTER — Other Ambulatory Visit: Payer: Self-pay

## 2019-02-11 ENCOUNTER — Encounter: Payer: Self-pay | Admitting: Obstetrics and Gynecology

## 2019-02-11 VITALS — BP 100/61 | HR 87 | Ht 66.0 in | Wt 271.6 lb

## 2019-02-11 DIAGNOSIS — N938 Other specified abnormal uterine and vaginal bleeding: Secondary | ICD-10-CM | POA: Diagnosis not present

## 2019-02-11 NOTE — Progress Notes (Signed)
Patient comes in today for irregular cycles. She is having two cycles a month. Not on any medication.

## 2019-02-11 NOTE — Progress Notes (Signed)
HPI:      [name] is a 48 y.o. No obstetric history on file. who LMP was Patient's last menstrual period was 01/28/2019.  Subjective:   She presents today stating that her menstrual cycles have become irregular.  She had regular monthly menses lasting approximately 5 days up until 3 months ago.  At that time she began having to "normal menstrual periods" per month.  Each of these lasted 5 days.  She has some cramping but this is not a major pathologic problem for her. Of significant note, patient has had a remote case of PID and tubal damage. She is not using any birth control at this time but has rare sexual intercourse. She has a recent Pap and pelvic examination both of which were normal.    Hx: The following portions of the patient's history were reviewed and updated as appropriate:             She  has a past medical history of H/O pelvic inflammatory disease, History of recurrent miscarriages, not currently pregnant, Hypertension, Obesity (BMI 30-39.9), Plantar fasciitis, and Reactive hypoglycemia (2011). She does not have any pertinent problems on file. She  has a past surgical history that includes Cholecystectomy (2007). Her family history includes Birth defects in her paternal uncle; Cancer in her maternal grandmother; Diabetes in her mother; Heart disease in her father, paternal aunt, paternal grandfather, and paternal uncle. She  reports that she has never smoked. She has never used smokeless tobacco. No history on file for alcohol and drug. She has a current medication list which includes the following prescription(s): amlodipine, losartan-hydrochlorothiazide, metformin, methylprednisolone, metoprolol succinate, and zolpidem, and the following Facility-Administered Medications: triamcinolone acetonide and triamcinolone acetonide. She has No Known Allergies.       Review of Systems:  Review of Systems  Constitutional: Denied constitutional symptoms, night sweats,  recent illness, fatigue, fever, insomnia and weight loss.  Eyes: Denied eye symptoms, eye pain, photophobia, vision change and visual disturbance.  Ears/Nose/Throat/Neck: Denied ear, nose, throat or neck symptoms, hearing loss, nasal discharge, sinus congestion and sore throat.  Cardiovascular: Denied cardiovascular symptoms, arrhythmia, chest pain/pressure, edema, exercise intolerance, orthopnea and palpitations.  Respiratory: Denied pulmonary symptoms, asthma, pleuritic pain, productive sputum, cough, dyspnea and wheezing.  Gastrointestinal: Denied, gastro-esophageal reflux, melena, nausea and vomiting.  Genitourinary: See HPI for additional information.  Musculoskeletal: Denied musculoskeletal symptoms, stiffness, swelling, muscle weakness and myalgia.  Dermatologic: Denied dermatology symptoms, rash and scar.  Neurologic: Denied neurology symptoms, dizziness, headache, neck pain and syncope.  Psychiatric: Denied psychiatric symptoms, anxiety and depression.  Endocrine: Denied endocrine symptoms including hot flashes and night sweats.   Meds:   Current Outpatient Medications on File Prior to Visit  Medication Sig Dispense Refill  . amLODipine (NORVASC) 2.5 MG tablet TAKE 1 TABLET BY MOUTH DAILY. 90 tablet 3  . losartan-hydrochlorothiazide (HYZAAR) 100-25 MG tablet Take 1 tablet by mouth daily. 90 tablet 3  . metFORMIN (GLUCOPHAGE) 500 MG tablet Take 1 tablet (500 mg total) by mouth daily with breakfast. 90 tablet 3  . methylPREDNISolone (MEDROL DOSEPAK) 4 MG TBPK tablet 6 day dose pack - take as directed 21 tablet 0  . metoprolol succinate (TOPROL-XL) 50 MG 24 hr tablet TAKE 1 TABLET BY MOUTH DAILY. TAKE WITH OR IMMEDIATELY FOLLOWING A MEAL. 90 tablet 3  . zolpidem (AMBIEN) 5 MG tablet TAKE 1 TABLET BY MOUTH ONCE DAILY AT BEDTIME AS NEEDED 30 tablet 5   Current Facility-Administered Medications on File Prior to  Visit  Medication Dose Route Frequency Provider Last Rate Last Dose  .  triamcinolone acetonide (KENALOG) 10 MG/ML injection 10 mg  10 mg Other Once [place], Titorya, DPM      . triamcinolone acetonide (KENALOG-40) injection 20 mg  20 mg Other Once Landis Martins, DPM        Objective:     Vitals:   02/11/19 1455  BP: 100/61  Pulse: 87                Assessment:    No obstetric history on file. Patient Active Problem List   Diagnosis Date Noted  . Subcutaneous mass of left foot 01/24/2019  . [name] 12/31/2018  . Morbid obesity ([place]) 12/31/2018  . Sciatica without back pain 12/09/2018  . Educated About Covid-19 Virus Infection 12/09/2018  . Insomnia 07/22/2014  . Snoring 05/03/2013  . Visit for preventive health examination 10/17/2011  . H/O pelvic inflammatory disease   . Reactive hypoglycemia   . History of recurrent miscarriages, not currently pregnant   . Morbid obesity with BMI of 40.0-44.9, adult ([place]) 07/17/2011  . White coat syndrome with hypertension 07/17/2011     1. DUB (dysfunctional uterine bleeding)     Possible endometrial polyp, possible uterine fibroids.  Not a cervical or vaginal issue based on recent normal Pap and recent normal exam.   Plan:            1.  Pelvic ultrasound to rule out uterine fibroids  2.  Hormonal/medical management of DUB discussed in detail including use of OCPs, IUD, progesterone. Orders No orders of the defined types were placed in this encounter.   No orders of the defined types were placed in this encounter.     F/U  Return in about 2 weeks (around 02/25/2019). I spent 31 minutes involved in the care of this patient of which greater than 50% was spent discussing history of irregular bleeding, history of miscarriage and PID, causes of DUB, work-up for DUB, possible future hormonal management of irregular bleeding.  Uterine fibroids were discussed.  All questions were answered.  Finis Bud, M.D. 02/11/2019 3:26 PM

## 2019-02-11 NOTE — Progress Notes (Signed)
   HPI: 48 y.o. female presenting today as a new patient with a chief complaint of dull aching pain secondary to a nodule located to the dorsal left foot that has been present for the past 3-4 months. She reports associated swelling. She has not had any recent treatment for her symptoms but has been treated in the past by Dr. Cannon Kettle. Applying pressure to the area increases the pain. Patient is here for further evaluation and treatment.   Past Medical History:  Diagnosis Date  . H/O pelvic inflammatory disease   . History of recurrent miscarriages, not currently pregnant   . Hypertension   . Obesity (BMI 30-39.9)   . Plantar fasciitis   . Reactive hypoglycemia 2011     Physical Exam: General: The patient is alert and oriented x3 in no acute distress.  Dermatology: Skin is warm, dry and supple bilateral lower extremities. Negative for open lesions or macerations.  Vascular: Palpable pedal pulses bilaterally. No edema or erythema noted. Capillary refill within normal limits.  Neurological: Epicritic and protective threshold grossly intact bilaterally.   Musculoskeletal Exam: Fluctuant, non-adhered mass noted to the dorsum of the left foot. Range of motion within normal limits to all pedal and ankle joints bilateral. Muscle strength 5/5 in all groups bilateral.   Radiographic Exam:  Normal osseous mineralization. Joint spaces preserved. No fracture/dislocation/boney destruction.    Assessment: 1. Ganglion cyst left dorsal foot   Plan of Care:  1. Patient evaluated. X-Rays reviewed.  2. Injection of 0.5 mLs Celestone Soluspan injected into the cyst.  3. Prescription for Medrol Dose Pak provided to patient. 4. Compression anklet dispensed.  5. Return to clinic in 4 weeks.   Referral coordinator at [facility].      Edrick Kins, DPM [facility]  Dr. Edrick Kins, DPM    [address], [place]  [address]                Office [phone]  Fax [phone]

## 2019-02-18 ENCOUNTER — Other Ambulatory Visit: Payer: Self-pay | Admitting: Podiatry

## 2019-02-18 ENCOUNTER — Other Ambulatory Visit: Payer: 59

## 2019-02-18 DIAGNOSIS — M67472 Ganglion, left ankle and foot: Secondary | ICD-10-CM

## 2019-02-19 ENCOUNTER — Other Ambulatory Visit: Payer: Self-pay | Admitting: Internal Medicine

## 2019-02-19 DIAGNOSIS — Z1231 Encounter for screening mammogram for malignant neoplasm of breast: Secondary | ICD-10-CM

## 2019-02-25 ENCOUNTER — Other Ambulatory Visit: Payer: Self-pay

## 2019-02-25 ENCOUNTER — Encounter: Payer: 59 | Admitting: Obstetrics and Gynecology

## 2019-02-25 ENCOUNTER — Ambulatory Visit (INDEPENDENT_AMBULATORY_CARE_PROVIDER_SITE_OTHER): Payer: 59

## 2019-02-25 DIAGNOSIS — N938 Other specified abnormal uterine and vaginal bleeding: Secondary | ICD-10-CM

## 2019-03-05 ENCOUNTER — Ambulatory Visit (INDEPENDENT_AMBULATORY_CARE_PROVIDER_SITE_OTHER): Payer: 59 | Admitting: Obstetrics and Gynecology

## 2019-03-05 ENCOUNTER — Other Ambulatory Visit: Payer: Self-pay

## 2019-03-05 ENCOUNTER — Encounter: Payer: Self-pay | Admitting: Obstetrics and Gynecology

## 2019-03-05 VITALS — BP 135/87 | HR 76 | Ht 66.0 in | Wt 268.0 lb

## 2019-03-05 DIAGNOSIS — N938 Other specified abnormal uterine and vaginal bleeding: Secondary | ICD-10-CM

## 2019-03-05 DIAGNOSIS — N83202 Unspecified ovarian cyst, left side: Secondary | ICD-10-CM

## 2019-03-05 NOTE — Progress Notes (Signed)
HPI:      [name] is a 48 y.o. No obstetric history on file. who LMP was Patient's last menstrual period was 02/24/2019.  Subjective:   She presents today for follow-up of her ultrasound for dysfunctional uterine bleeding.  She continues to experience menses every 2 weeks.  She denies significant pelvic pain.    Hx: The following portions of the patient's history were reviewed and updated as appropriate:             She  has a past medical history of H/O pelvic inflammatory disease, History of recurrent miscarriages, not currently pregnant, Hypertension, Obesity (BMI 30-39.9), Plantar fasciitis, and Reactive hypoglycemia (2011). She does not have any pertinent problems on file. She  has a past surgical history that includes Cholecystectomy (2007). Her family history includes Birth defects in her paternal uncle; Cancer in her maternal grandmother; Diabetes in her mother; Heart disease in her father, paternal aunt, paternal grandfather, and paternal uncle. She  reports that she has never smoked. She has never used smokeless tobacco. No history on file for alcohol and drug. She has a current medication list which includes the following prescription(s): amlodipine, losartan-hydrochlorothiazide, metformin, methylprednisolone, metoprolol succinate, and zolpidem, and the following Facility-Administered Medications: triamcinolone acetonide and triamcinolone acetonide. She has No Known Allergies.       Review of Systems:  Review of Systems  Constitutional: Denied constitutional symptoms, night sweats, recent illness, fatigue, fever, insomnia and weight loss.  Eyes: Denied eye symptoms, eye pain, photophobia, vision change and visual disturbance.  Ears/Nose/Throat/Neck: Denied ear, nose, throat or neck symptoms, hearing loss, nasal discharge, sinus congestion and sore throat.  Cardiovascular: Denied cardiovascular symptoms, arrhythmia, chest pain/pressure, edema, exercise intolerance,  orthopnea and palpitations.  Respiratory: Denied pulmonary symptoms, asthma, pleuritic pain, productive sputum, cough, dyspnea and wheezing.  Gastrointestinal: Denied, gastro-esophageal reflux, melena, nausea and vomiting.  Genitourinary: See HPI for additional information.  Musculoskeletal: Denied musculoskeletal symptoms, stiffness, swelling, muscle weakness and myalgia.  Dermatologic: Denied dermatology symptoms, rash and scar.  Neurologic: Denied neurology symptoms, dizziness, headache, neck pain and syncope.  Psychiatric: Denied psychiatric symptoms, anxiety and depression.  Endocrine: Denied endocrine symptoms including hot flashes and night sweats.   Meds:   Current Outpatient Medications on File Prior to Visit  Medication Sig Dispense Refill  . amLODipine (NORVASC) 2.5 MG tablet TAKE 1 TABLET BY MOUTH DAILY. 90 tablet 3  . losartan-hydrochlorothiazide (HYZAAR) 100-25 MG tablet Take 1 tablet by mouth daily. 90 tablet 3  . metFORMIN (GLUCOPHAGE) 500 MG tablet Take 1 tablet (500 mg total) by mouth daily with breakfast. 90 tablet 3  . methylPREDNISolone (MEDROL DOSEPAK) 4 MG TBPK tablet 6 day dose pack - take as directed 21 tablet 0  . metoprolol succinate (TOPROL-XL) 50 MG 24 hr tablet TAKE 1 TABLET BY MOUTH DAILY. TAKE WITH OR IMMEDIATELY FOLLOWING A MEAL. 90 tablet 3  . zolpidem (AMBIEN) 5 MG tablet TAKE 1 TABLET BY MOUTH ONCE DAILY AT BEDTIME AS NEEDED 30 tablet 5   Current Facility-Administered Medications on File Prior to Visit  Medication Dose Route Frequency Provider Last Rate Last Dose  . triamcinolone acetonide (KENALOG) 10 MG/ML injection 10 mg  10 mg Other Once [place], Titorya, DPM      . triamcinolone acetonide (KENALOG-40) injection 20 mg  20 mg Other Once Landis Martins, DPM        Objective:     Vitals:   03/05/19 0934  BP: 135/87  Pulse: 76  Ultrasound reveals a small complex left ovarian cyst.  Assessment:    No obstetric history on  file. Patient Active Problem List   Diagnosis Date Noted  . Subcutaneous mass of left foot 01/24/2019  . [name] 12/31/2018  . Morbid obesity ([place]) 12/31/2018  . Sciatica without back pain 12/09/2018  . Educated About Covid-19 Virus Infection 12/09/2018  . Insomnia 07/22/2014  . Snoring 05/03/2013  . Visit for preventive health examination 10/17/2011  . H/O pelvic inflammatory disease   . Reactive hypoglycemia   . History of recurrent miscarriages, not currently pregnant   . Morbid obesity with BMI of 40.0-44.9, adult ([place]) 07/17/2011  . White coat syndrome with hypertension 07/17/2011     1. DUB (dysfunctional uterine bleeding)   2. Cyst of left ovary     Ultrasound review of left ovarian cyst is most consistent with hemorrhagic type cyst.  No significant blood flow noted.   Plan:            1.  I discussed ovarian cyst in detail with the patient complex versus simple reviewed.  I recommend a follow-up ultrasound in 6 weeks if the cyst is the same size or enlarging consider tumor marker screening.  If the cyst is decreasing in size nothing further to do.  2.  We have discussed her dysfunctional bleeding and I have discussed multiple methods of cycle control.  The patient has elected to have an IUD placed.  She will call at the start of her next menses. Orders Orders Placed This Encounter  Procedures  . US PELVIS (TRANSABDOMINAL ONLY)    No orders of the defined types were placed in this encounter.     F/U  Return for She is to call at the start of next menses. I spent 16 minutes involved in the care of this patient of which greater than 50% was spent discussing ultrasound findings, work-up for ovarian cyst, differential diagnosis, control of her irregular cycles.  All questions answered.  Finis Bud, M.D. 03/05/2019 10:26 AM

## 2019-03-05 NOTE — Progress Notes (Signed)
Patient comes in today for [place] results.

## 2019-03-12 ENCOUNTER — Encounter: Payer: Self-pay | Admitting: Podiatry

## 2019-03-12 ENCOUNTER — Ambulatory Visit (INDEPENDENT_AMBULATORY_CARE_PROVIDER_SITE_OTHER): Payer: 59 | Admitting: Podiatry

## 2019-03-12 ENCOUNTER — Other Ambulatory Visit: Payer: Self-pay

## 2019-03-12 DIAGNOSIS — M67472 Ganglion, left ankle and foot: Secondary | ICD-10-CM

## 2019-03-12 MED ORDER — MELOXICAM 15 MG PO TABS: 15.0000 mg | ORAL_TABLET | Freq: Every day | ORAL | 1 refills | Status: DC

## 2019-03-16 NOTE — Progress Notes (Signed)
   HPI: 48 y.o. female presenting today for follow up evaluation of a ganglion cyst located on the left dorsal foot. She states the pain has improved but is still present intermittently. She has been using the compression anklet as directed. There are no modifying factors noted. Patient is here for further evaluation and treatment.   Past Medical History:  Diagnosis Date  . H/O pelvic inflammatory disease   . History of recurrent miscarriages, not currently pregnant   . Hypertension   . Obesity (BMI 30-39.9)   . Plantar fasciitis   . Reactive hypoglycemia 2011     Physical Exam: General: The patient is alert and oriented x3 in no acute distress.  Dermatology: Skin is warm, dry and supple bilateral lower extremities. Negative for open lesions or macerations.  Vascular: Palpable pedal pulses bilaterally. No edema or erythema noted. Capillary refill within normal limits.  Neurological: Epicritic and protective threshold grossly intact bilaterally.   Musculoskeletal Exam: Fluctuant, non-adhered mass noted to the dorsum of the left foot. Range of motion within normal limits to all pedal and ankle joints bilateral. Muscle strength 5/5 in all groups bilateral.   Assessment: 1. Ganglion cyst left dorsal foot   Plan of Care:  1. Patient evaluated. 2. Injection of 0.5 mLs Celestone Soluspan injected into the cyst.  3. Prescription for Meloxicam provided to patient. 4. Continue using compression anklet.  5. Explained that due to the location of the cyst we will try to avoid surgery.  6. Return to clinic in 4 weeks.   Referral coordinator at [facility].      Edrick Kins, DPM [facility]  Dr. Edrick Kins, DPM    [address], [place] [address]                Office [phone]  Fax [phone]

## 2019-03-31 ENCOUNTER — Ambulatory Visit (INDEPENDENT_AMBULATORY_CARE_PROVIDER_SITE_OTHER): Payer: 59 | Admitting: Obstetrics and Gynecology

## 2019-03-31 ENCOUNTER — Other Ambulatory Visit: Payer: Self-pay

## 2019-03-31 ENCOUNTER — Encounter: Payer: Self-pay | Admitting: Obstetrics and Gynecology

## 2019-03-31 VITALS — BP 120/67 | HR 71 | Ht 66.0 in | Wt 272.4 lb

## 2019-03-31 DIAGNOSIS — Z30430 Encounter for insertion of intrauterine contraceptive device: Secondary | ICD-10-CM

## 2019-03-31 NOTE — Progress Notes (Signed)
HPI:      [name] is a 48 y.o. No obstetric history on file. who LMP was Patient's last menstrual period was 03/22/2019.  Subjective:   She presents today for IUD insertion.  She is having it inserted to control her irregular menstrual bleeding.    Hx: The following portions of the patient's history were reviewed and updated as appropriate:             She  has a past medical history of H/O pelvic inflammatory disease, History of recurrent miscarriages, not currently pregnant, Hypertension, Obesity (BMI 30-39.9), Plantar fasciitis, and Reactive hypoglycemia (2011). She does not have any pertinent problems on file. She  has a past surgical history that includes Cholecystectomy (2007). Her family history includes Birth defects in her paternal uncle; Cancer in her maternal grandmother; Diabetes in her mother; Heart disease in her father, paternal aunt, paternal grandfather, and paternal uncle. She  reports that she has never smoked. She has never used smokeless tobacco. No history on file for alcohol and drug. She has a current medication list which includes the following prescription(s): amlodipine, losartan-hydrochlorothiazide, meloxicam, metformin, metoprolol succinate, and zolpidem, and the following Facility-Administered Medications: triamcinolone acetonide and triamcinolone acetonide. She has No Known Allergies.       Review of Systems:  Review of Systems  Constitutional: Denied constitutional symptoms, night sweats, recent illness, fatigue, fever, insomnia and weight loss.  Eyes: Denied eye symptoms, eye pain, photophobia, vision change and visual disturbance.  Ears/Nose/Throat/Neck: Denied ear, nose, throat or neck symptoms, hearing loss, nasal discharge, sinus congestion and sore throat.  Cardiovascular: Denied cardiovascular symptoms, arrhythmia, chest pain/pressure, edema, exercise intolerance, orthopnea and palpitations.  Respiratory: Denied pulmonary symptoms, asthma,  pleuritic pain, productive sputum, cough, dyspnea and wheezing.  Gastrointestinal: Denied, gastro-esophageal reflux, melena, nausea and vomiting.  Genitourinary: Denied genitourinary symptoms including symptomatic vaginal discharge, pelvic relaxation issues, and urinary complaints.  Musculoskeletal: Denied musculoskeletal symptoms, stiffness, swelling, muscle weakness and myalgia.  Dermatologic: Denied dermatology symptoms, rash and scar.  Neurologic: Denied neurology symptoms, dizziness, headache, neck pain and syncope.  Psychiatric: Denied psychiatric symptoms, anxiety and depression.  Endocrine: Denied endocrine symptoms including hot flashes and night sweats.   Meds:   Current Outpatient Medications on File Prior to Visit  Medication Sig Dispense Refill  . amLODipine (NORVASC) 2.5 MG tablet TAKE 1 TABLET BY MOUTH DAILY. 90 tablet 3  . losartan-hydrochlorothiazide (HYZAAR) 100-25 MG tablet Take 1 tablet by mouth daily. 90 tablet 3  . meloxicam (MOBIC) 15 MG tablet Take 1 tablet (15 mg total) by mouth daily. 30 tablet 1  . metFORMIN (GLUCOPHAGE) 500 MG tablet Take 1 tablet (500 mg total) by mouth daily with breakfast. 90 tablet 3  . metoprolol succinate (TOPROL-XL) 50 MG 24 hr tablet TAKE 1 TABLET BY MOUTH DAILY. TAKE WITH OR IMMEDIATELY FOLLOWING A MEAL. 90 tablet 3  . zolpidem (AMBIEN) 5 MG tablet TAKE 1 TABLET BY MOUTH ONCE DAILY AT BEDTIME AS NEEDED 30 tablet 5   Current Facility-Administered Medications on File Prior to Visit  Medication Dose Route Frequency Provider Last Rate Last Dose  . triamcinolone acetonide (KENALOG) 10 MG/ML injection 10 mg  10 mg Other Once [place], Titorya, DPM      . triamcinolone acetonide (KENALOG-40) injection 20 mg  20 mg Other Once Landis Martins, DPM        Objective:     Vitals:   03/31/19 1135  BP: 120/67  Pulse: 71    Physical examination  Pelvic:   Vulva: Normal appearance.  No lesions.  Vagina: No lesions or abnormalities noted.   Support: Normal pelvic support.  Urethra No masses tenderness or scarring.  Meatus Normal size without lesions or prolapse.  Cervix: Normal appearance.  No lesions.  Anus: Normal exam.  No lesions.  Perineum: Normal exam.  No lesions.        Bimanual   Uterus: Normal size.  Non-tender.  Mobile.  AV.  Adnexae: No masses.  Non-tender to palpation.  Cul-de-sac: Negative for abnormality.   IUD Procedure Pt has read the booklet and signed the appropriate forms regarding the Mirena IUD.  All of her questions have been answered.   The cervix was cleansed with betadine solution.  After sounding the uterus and noting the position, the IUD was placed in the usual manner without problem.  The string was cut to the appropriate length.  The patient tolerated the procedure well.              Assessment:    No obstetric history on file. Patient Active Problem List   Diagnosis Date Noted  . Subcutaneous mass of left foot 01/24/2019  . [name] 12/31/2018  . Morbid obesity ([place]) 12/31/2018  . Sciatica without back pain 12/09/2018  . Educated about COVID-19 virus infection 12/09/2018  . Insomnia 07/22/2014  . Snoring 05/03/2013  . Visit for preventive health examination 10/17/2011  . H/O pelvic inflammatory disease   . Reactive hypoglycemia   . History of recurrent miscarriages, not currently pregnant   . Morbid obesity with BMI of 40.0-44.9, adult ([place]) 07/17/2011  . White coat syndrome with hypertension 07/17/2011     1. Encounter for insertion of mirena IUD       Plan:             F/U  Return in about 4 weeks (around 04/28/2019).  Finis Bud, M.D. 03/31/2019 11:53 AM

## 2019-04-05 ENCOUNTER — Other Ambulatory Visit: Payer: Self-pay | Admitting: Internal Medicine

## 2019-04-05 NOTE — Telephone Encounter (Signed)
Refilled: 10/05/2018 Last OV: 01/22/2019 Next OV: not scheduled

## 2019-04-16 ENCOUNTER — Other Ambulatory Visit: Payer: Self-pay

## 2019-04-16 ENCOUNTER — Ambulatory Visit (INDEPENDENT_AMBULATORY_CARE_PROVIDER_SITE_OTHER): Payer: 59 | Admitting: Podiatry

## 2019-04-16 DIAGNOSIS — M67472 Ganglion, left ankle and foot: Secondary | ICD-10-CM

## 2019-04-19 NOTE — Progress Notes (Signed)
   HPI: 48 y.o. female presenting today for follow up evaluation of a ganglion cyst located on the left dorsal foot. She states the area has improved slightly. She reports some continued tenderness. She has been taking Meloxicam and using the compression anklet as directed. Patient is here for further evaluation and treatment.   Past Medical History:  Diagnosis Date  . H/O pelvic inflammatory disease   . History of recurrent miscarriages, not currently pregnant   . Hypertension   . Obesity (BMI 30-39.9)   . Plantar fasciitis   . Reactive hypoglycemia 2011     Physical Exam: General: The patient is alert and oriented x3 in no acute distress.  Dermatology: Skin is warm, dry and supple bilateral lower extremities. Negative for open lesions or macerations.  Vascular: Palpable pedal pulses bilaterally. No edema or erythema noted. Capillary refill within normal limits.  Neurological: Epicritic and protective threshold grossly intact bilaterally.   Musculoskeletal Exam: Fluctuant, non-adhered mass noted to the dorsum of the left foot. Range of motion within normal limits to all pedal and ankle joints bilateral. Muscle strength 5/5 in all groups bilateral.   Assessment: 1. Ganglion cyst left dorsal foot   Plan of Care:  1. Patient evaluated. 2. Injection of 0.5 mLs Celestone Soluspan injected into the cyst.  3. Continue taking Meloxicam.  4. Continue using compression anklet.  5. Return to clinic in 3 months for surgical consult.   Referral coordinator at [facility].      Edrick Kins, DPM [facility]  Dr. Edrick Kins, DPM    [address], [place] [address]                Office [phone]  Fax [phone]

## 2019-04-20 ENCOUNTER — Other Ambulatory Visit: Payer: Self-pay

## 2019-04-20 ENCOUNTER — Ambulatory Visit (INDEPENDENT_AMBULATORY_CARE_PROVIDER_SITE_OTHER): Payer: 59

## 2019-04-20 DIAGNOSIS — R102 Pelvic and perineal pain: Secondary | ICD-10-CM

## 2019-04-20 DIAGNOSIS — N83202 Unspecified ovarian cyst, left side: Secondary | ICD-10-CM

## 2019-04-21 ENCOUNTER — Encounter: Payer: 59 | Admitting: Obstetrics and Gynecology

## 2019-04-27 ENCOUNTER — Encounter: Payer: Self-pay | Admitting: Obstetrics and Gynecology

## 2019-04-27 ENCOUNTER — Ambulatory Visit (INDEPENDENT_AMBULATORY_CARE_PROVIDER_SITE_OTHER): Payer: 59 | Admitting: Obstetrics and Gynecology

## 2019-04-27 ENCOUNTER — Other Ambulatory Visit: Payer: Self-pay

## 2019-04-27 VITALS — BP 90/58 | HR 93 | Ht 66.0 in | Wt 270.6 lb

## 2019-04-27 DIAGNOSIS — N83202 Unspecified ovarian cyst, left side: Secondary | ICD-10-CM | POA: Diagnosis not present

## 2019-04-27 DIAGNOSIS — Z30431 Encounter for routine checking of intrauterine contraceptive device: Secondary | ICD-10-CM | POA: Diagnosis not present

## 2019-04-27 DIAGNOSIS — N938 Other specified abnormal uterine and vaginal bleeding: Secondary | ICD-10-CM | POA: Diagnosis not present

## 2019-04-27 NOTE — Progress Notes (Signed)
HPI:      [name] is a 48 y.o. No obstetric history on file. who LMP was No LMP recorded. (Menstrual status: IUD).  Subjective:   She presents today for IUD string check.  She is not sexually active so she has no experience with problems during intercourse.  She says that her bleeding has been daily since insertion but she describes it as "light". Her follow-up ultrasound for ovarian cyst was normal with appropriately placed IUD.    Hx: The following portions of the patient's history were reviewed and updated as appropriate:             She  has a past medical history of H/O pelvic inflammatory disease, History of recurrent miscarriages, not currently pregnant, Hypertension, Obesity (BMI 30-39.9), Plantar fasciitis, and Reactive hypoglycemia (2011). She does not have any pertinent problems on file. She  has a past surgical history that includes Cholecystectomy (2007). Her family history includes Birth defects in her paternal uncle; Cancer in her maternal grandmother; Diabetes in her mother; Heart disease in her father, paternal aunt, paternal grandfather, and paternal uncle. She  reports that she has never smoked. She has never used smokeless tobacco. No history on file for alcohol and drug. She has a current medication list which includes the following prescription(s): amlodipine, losartan-hydrochlorothiazide, meloxicam, metformin, metoprolol succinate, and zolpidem, and the following Facility-Administered Medications: triamcinolone acetonide and triamcinolone acetonide. She has No Known Allergies.       Review of Systems:  Review of Systems  Constitutional: Denied constitutional symptoms, night sweats, recent illness, fatigue, fever, insomnia and weight loss.  Eyes: Denied eye symptoms, eye pain, photophobia, vision change and visual disturbance.  Ears/Nose/Throat/Neck: Denied ear, nose, throat or neck symptoms, hearing loss, nasal discharge, sinus congestion and sore throat.   Cardiovascular: Denied cardiovascular symptoms, arrhythmia, chest pain/pressure, edema, exercise intolerance, orthopnea and palpitations.  Respiratory: Denied pulmonary symptoms, asthma, pleuritic pain, productive sputum, cough, dyspnea and wheezing.  Gastrointestinal: Denied, gastro-esophageal reflux, melena, nausea and vomiting.  Genitourinary: Denied genitourinary symptoms including symptomatic vaginal discharge, pelvic relaxation issues, and urinary complaints.  Musculoskeletal: Denied musculoskeletal symptoms, stiffness, swelling, muscle weakness and myalgia.  Dermatologic: Denied dermatology symptoms, rash and scar.  Neurologic: Denied neurology symptoms, dizziness, headache, neck pain and syncope.  Psychiatric: Denied psychiatric symptoms, anxiety and depression.  Endocrine: Denied endocrine symptoms including hot flashes and night sweats.   Meds:   Current Outpatient Medications on File Prior to Visit  Medication Sig Dispense Refill  . amLODipine (NORVASC) 2.5 MG tablet TAKE 1 TABLET BY MOUTH DAILY. 90 tablet 3  . losartan-hydrochlorothiazide (HYZAAR) 100-25 MG tablet Take 1 tablet by mouth daily. 90 tablet 3  . meloxicam (MOBIC) 15 MG tablet Take 1 tablet (15 mg total) by mouth daily. 30 tablet 1  . metFORMIN (GLUCOPHAGE) 500 MG tablet Take 1 tablet (500 mg total) by mouth daily with breakfast. 90 tablet 3  . metoprolol succinate (TOPROL-XL) 50 MG 24 hr tablet TAKE 1 TABLET BY MOUTH DAILY. TAKE WITH OR IMMEDIATELY FOLLOWING A MEAL. 90 tablet 3  . zolpidem (AMBIEN) 5 MG tablet TAKE 1 TABLET BY MOUTH ONCE DAILY AT BEDTIME AS NEEDED 30 tablet 5   Current Facility-Administered Medications on File Prior to Visit  Medication Dose Route Frequency Provider Last Rate Last Dose  . triamcinolone acetonide (KENALOG) 10 MG/ML injection 10 mg  10 mg Other Once Landis Martins, DPM      . triamcinolone acetonide (KENALOG-40) injection 20 mg  20 mg Other  Once Landis Martins, DPM         Objective:     Vitals:   04/27/19 1447  BP: (!) 90/58  Pulse: 93              Physical examination   Pelvic:   Vulva: Normal appearance.  No lesions.  Vagina: No lesions or abnormalities noted.  Support: Normal pelvic support.  Urethra No masses tenderness or scarring.  Meatus Normal size without lesions or prolapse.  Cervix: Normal appearance.  No lesions. IUD strings noted at cervical os.  Anus: Normal exam.  No lesions.  Perineum: Normal exam.  No lesions.        Bimanual   Uterus: Normal size.  Non-tender.  Mobile.  AV.  Adnexae: No masses.  Non-tender to palpation.  Cul-de-sac: Negative for abnormality.     Assessment:    No obstetric history on file. Patient Active Problem List   Diagnosis Date Noted  . Subcutaneous mass of left foot 01/24/2019  . [name] 12/31/2018  . Morbid obesity ([place]) 12/31/2018  . Sciatica without back pain 12/09/2018  . Educated about COVID-19 virus infection 12/09/2018  . Insomnia 07/22/2014  . Snoring 05/03/2013  . Visit for preventive health examination 10/17/2011  . H/O pelvic inflammatory disease   . Reactive hypoglycemia   . History of recurrent miscarriages, not currently pregnant   . Morbid obesity with BMI of 40.0-44.9, adult ([place]) 07/17/2011  . White coat syndrome with hypertension 07/17/2011     1. Surveillance of previously prescribed intrauterine contraceptive device   2. DUB (dysfunctional uterine bleeding)   3. Cyst of left ovary     IUD placed to control dysfunctional bleeding.  Patient believes it is working.   Resolved cyst of the left ovary based on ultrasound.   Plan:            1.  Expect resolution of bleeding.  Continued expectant management.  2.  Follow-up for annual examination.  Patient to contact us sooner if bleeding not resolved. Orders No orders of the defined types were placed in this encounter.   No orders of the defined types were placed in this encounter.     F/U  Return in  about 40 weeks (around 02/01/2020) for Annual Physical, Pt to contact us if symptoms worsen. I spent 17 minutes involved in the care of this patient of which greater than 50% was spent discussing bleeding and IUD, ultrasound follow-up of ovarian cyst, expectant management regarding bleeding.  All questions answered.  Finis Bud, M.D. 04/27/2019 3:11 PM

## 2019-04-27 NOTE — Progress Notes (Signed)
Patient comes in today for IUD string check. She is still spotting.

## 2019-05-03 ENCOUNTER — Ambulatory Visit
Admission: RE | Admit: 2019-05-03 | Discharge: 2019-05-03 | Disposition: A | Discharge status: Home / Self Care | Payer: 59 | Source: Ambulatory Visit | Attending: Internal Medicine | Admitting: Internal Medicine

## 2019-05-03 DIAGNOSIS — Z1231 Encounter for screening mammogram for malignant neoplasm of breast: Secondary | ICD-10-CM | POA: Diagnosis not present

## 2019-05-11 DIAGNOSIS — M25551 Pain in right hip: Secondary | ICD-10-CM

## 2019-05-15 DIAGNOSIS — M25551 Pain in right hip: Secondary | ICD-10-CM | POA: Insufficient documentation

## 2019-05-15 NOTE — Assessment & Plan Note (Signed)
Referring to [facility] for evaluation

## 2019-05-28 DIAGNOSIS — M5416 Radiculopathy, lumbar region: Secondary | ICD-10-CM | POA: Diagnosis not present

## 2019-06-09 DIAGNOSIS — M5416 Radiculopathy, lumbar region: Secondary | ICD-10-CM | POA: Diagnosis not present

## 2019-06-23 DIAGNOSIS — M5416 Radiculopathy, lumbar region: Secondary | ICD-10-CM | POA: Diagnosis not present

## 2019-06-28 DIAGNOSIS — M47816 Spondylosis without myelopathy or radiculopathy, lumbar region: Secondary | ICD-10-CM | POA: Insufficient documentation

## 2019-06-28 DIAGNOSIS — M5416 Radiculopathy, lumbar region: Secondary | ICD-10-CM | POA: Insufficient documentation

## 2019-06-28 DIAGNOSIS — M545 Low back pain, unspecified: Secondary | ICD-10-CM | POA: Insufficient documentation

## 2019-07-14 DIAGNOSIS — M5416 Radiculopathy, lumbar region: Secondary | ICD-10-CM | POA: Diagnosis not present

## 2019-07-16 ENCOUNTER — Ambulatory Visit: Payer: 59 | Admitting: Podiatry

## 2019-08-06 ENCOUNTER — Ambulatory Visit: Payer: 59 | Admitting: Podiatry

## 2019-08-11 DIAGNOSIS — M5416 Radiculopathy, lumbar region: Secondary | ICD-10-CM | POA: Diagnosis not present

## 2019-08-20 ENCOUNTER — Other Ambulatory Visit: Payer: Self-pay

## 2019-08-20 ENCOUNTER — Ambulatory Visit (INDEPENDENT_AMBULATORY_CARE_PROVIDER_SITE_OTHER): Payer: 59 | Admitting: Podiatry

## 2019-08-20 DIAGNOSIS — M898X7 Other specified disorders of bone, ankle and foot: Secondary | ICD-10-CM

## 2019-08-20 DIAGNOSIS — M67472 Ganglion, left ankle and foot: Secondary | ICD-10-CM | POA: Diagnosis not present

## 2019-08-20 MED ORDER — MELOXICAM 15 MG PO TABS: 15.0000 mg | ORAL_TABLET | Freq: Every day | ORAL | 0 refills | Status: DC

## 2019-08-20 NOTE — Patient Instructions (Signed)
Pre-Operative Instructions  Congratulations, you have decided to take an important step towards improving your quality of life.  You can be assured that the doctors and staff at [facility] will be with you every step of the way.  Here are some important things you should know:  1. Plan to be at the surgery center/hospital at least 1 (one) hour prior to your scheduled time, unless otherwise directed by the surgical center/hospital staff.  You must have a responsible adult accompany you, remain during the surgery and drive you home.  Make sure you have directions to the surgical center/hospital to ensure you arrive on time. 2. If you are having surgery at [facility] or [facility], you will need a copy of your medical history and physical form from your family physician within one month prior to the date of surgery. We will give you a form for your primary physician to complete.  3. We make every effort to accommodate the date you request for surgery.  However, there are times where surgery dates or times have to be moved.  We will contact you as soon as possible if a change in schedule is required.   4. No aspirin/ibuprofen for one week before surgery.  If you are on aspirin, any non-steroidal anti-inflammatory medications (Mobic, Aleve, Ibuprofen) should not be taken seven (7) days prior to your surgery.  You make take Tylenol for pain prior to surgery.  5. Medications - If you are taking daily heart and blood pressure medications, seizure, reflux, allergy, asthma, anxiety, pain or diabetes medications, make sure you notify the surgery center/hospital before the day of surgery so they can tell you which medications you should take or avoid the day of surgery. 6. No food or drink after midnight the night before surgery unless directed otherwise by surgical center/hospital staff. 7. No alcoholic beverages 24-hours prior to surgery.  No smoking 24-hours prior or 24-hours after  surgery. 8. Wear loose pants or shorts. They should be loose enough to fit over bandages, boots, and casts. 9. Don't wear slip-on shoes. Sneakers are preferred. 10. Bring your boot with you to the surgery center/hospital.  Also bring crutches or a walker if your physician has prescribed it for you.  If you do not have this equipment, it will be provided for you after surgery. 11. If you have not been contacted by the surgery center/hospital by the day before your surgery, call to confirm the date and time of your surgery. 12. Leave-time from work may vary depending on the type of surgery you have.  Appropriate arrangements should be made prior to surgery with your employer. 13. Prescriptions will be provided immediately following surgery by your doctor.  Fill these as soon as possible after surgery and take the medication as directed. Pain medications will not be refilled on weekends and must be approved by the doctor. 14. Remove nail polish on the operative foot and avoid getting pedicures prior to surgery. 15. Wash the night before surgery.  The night before surgery wash the foot and leg well with water and the antibacterial soap provided. Be sure to pay special attention to beneath the toenails and in between the toes.  Wash for at least three (3) minutes. Rinse thoroughly with water and dry well with a towel.  Perform this wash unless told not to do so by your physician.  Enclosed: 1 Ice pack (please put in freezer the night before surgery)   1 Hibiclens skin cleaner     Pre-op instructions  If you have any questions regarding the instructions, please do not hesitate to call our office.  [place]: [address], , [place] [address] -- [phone]  [place]: [address] [place], [place] [address] -- [phone]  [place]: [address], [place], [place] [address] -- [phone]   Website: [email or web address] 

## 2019-08-22 NOTE — Progress Notes (Signed)
   HPI: 49 y.o. female presenting today for follow up evaluation of a ganglion cyst located on the left dorsal foot. She reports continued pain that is relatively unchanged from her previous appointment. She has been using the compression anklet and taking Meloxicam with no significant relief. There are no modifying factors noted. Patient is here for further evaluation and treatment.   Past Medical History:  Diagnosis Date  . H/O pelvic inflammatory disease   . History of recurrent miscarriages, not currently pregnant   . Hypertension   . Obesity (BMI 30-39.9)   . Plantar fasciitis   . Reactive hypoglycemia 2011     Physical Exam: General: The patient is alert and oriented x3 in no acute distress.  Dermatology: Skin is warm, dry and supple bilateral lower extremities. Negative for open lesions or macerations.  Vascular: Palpable pedal pulses bilaterally. No edema or erythema noted. Capillary refill within normal limits.  Neurological: Epicritic and protective threshold grossly intact bilaterally.   Musculoskeletal Exam: Fluctuant, non-adhered mass noted to the dorsum of the left foot. Exostosis noted to the left dorsal midfoot. Range of motion within normal limits to all pedal and ankle joints bilateral. Muscle strength 5/5 in all groups bilateral.   Assessment: 1. Ganglion cyst left dorsal foot 2. Exostosis left dorsal midfoot    Plan of Care:  1. Patient evaluated. 2. Injection of 0.5 mLs Celestone Soluspan injected into the cyst.  3. Today we discussed the conservative versus surgical management of the presenting pathology. The patient opts for surgical management. All possible complications and details of the procedure were explained. All patient questions were answered. No guarantees were expressed or implied. 4. Authorization for surgery was initiated today. Surgery will consist of excision of cyst left; exostectomy left dorsal midfoot.  5. Refill prescription for Meloxicam  provided to patient.  6. Return to clinic one week post op.   Referral coordinator at [facility].      Edrick Kins, DPM [facility]  Dr. Edrick Kins, DPM    [address], [place] [address]                Office [phone]  Fax [phone]

## 2019-09-02 ENCOUNTER — Other Ambulatory Visit: Payer: Self-pay | Admitting: Internal Medicine

## 2019-09-22 ENCOUNTER — Telehealth (INDEPENDENT_AMBULATORY_CARE_PROVIDER_SITE_OTHER): Payer: 59 | Admitting: Internal Medicine

## 2019-09-22 ENCOUNTER — Encounter: Payer: Self-pay | Admitting: Internal Medicine

## 2019-09-22 DIAGNOSIS — I10 Essential (primary) hypertension: Secondary | ICD-10-CM | POA: Diagnosis not present

## 2019-09-22 DIAGNOSIS — M67479 Ganglion, unspecified ankle and foot: Secondary | ICD-10-CM | POA: Diagnosis not present

## 2019-09-22 MED ORDER — TRAMADOL HCL 50 MG PO TABS: 50.0000 mg | ORAL_TABLET | Freq: Two times a day (BID) | ORAL | 0 refills | Status: AC | PRN

## 2019-09-22 MED ORDER — AMLODIPINE BESYLATE 2.5 MG PO TABS: 5.0000 mg | ORAL_TABLET | Freq: Every day | ORAL | 3 refills | Status: DC

## 2019-09-22 NOTE — Patient Instructions (Signed)
Melanie Chavez,  Stay off the meloxicam for a little while longer.  Use 650 mg tylenol every  8 to 12  Hours (maximum 3 daily)  Add tramadol 50 mg every 12 hours if needed for pain   Increase amlodipine dose to 5 mg daily . Continue losartan,  hctz and metoprolol  Send my message in about 10 days   If BP is not 130/80 or lower after 10 days ,  We will increase the hctz to 2 tablets daily  (morning).  And I will recommend that we get that sleep study because sleep apnea may be causing your blood pressure to go up

## 2019-09-22 NOTE — Progress Notes (Signed)
Virtual Visit via [place]  This visit type was conducted due to national recommendations for restrictions regarding the COVID-19 pandemic (e.g. social distancing).  This format is felt to be most appropriate for this patient at this time.  All issues noted in this document were discussed and addressed.  No physical exam was performed (except for noted visual exam findings with Video Visits).   I connected with@ on 09/22/19 at  4:30 PM EDT by a video enabled telemedicine application  and verified that I am speaking with the correct person using two identifiers. Location patient: home Location provider: work or home office Persons participating in the virtual visit: patient, provider  I discussed the limitations, risks, security and privacy concerns of performing an evaluation and management service by telephone and the availability of in person appointments. I also discussed with the patient that there may be a patient responsible charge related to this service. The patient expressed understanding and agreed to proceed.  Reason for visit: follow up on hypertension  HPI:  49 yr old female with morbid obesity,  hypertension on 4 medications, snoring /insomnia (refuses sleep study)  presents for follow up on hypertension.  Hypertension: patient checks blood pressure twice weekly at home.  Readings have been for the most part > 150/80 at rest . Patient is following a reduced salt diet most days and is taking medications as prescribed.  She saw no change with suspension of NSAIDs,.  Denies chest pain , shortness of breath,  But has been having increased foot pain and is scheuled for elective surgery in May to excise a ganglionic cyst on the dorsum of left foot and perform and exostectomy   ROS: See pertinent positives and negatives per HPI.  Past Medical History:  Diagnosis Date  . H/O pelvic inflammatory disease   . History of recurrent miscarriages, not currently pregnant   . Hypertension    . Obesity (BMI 30-39.9)   . Plantar fasciitis   . Reactive hypoglycemia 2011    Past Surgical History:  Procedure Laterality Date  . CHOLECYSTECTOMY  2007   [facility]    Family History  Problem Relation Age of Onset  . Diabetes Mother   . Heart disease Father        CAD tobacco   . Cancer Maternal Grandmother        throat CA (snuff)  . Heart disease Paternal Aunt   . Heart disease Paternal Uncle   . Birth defects Paternal Uncle   . Heart disease Paternal Grandfather   . Breast cancer Neg Hx     SOCIAL HX:  reports that she has never smoked. She has never used smokeless tobacco. No history on file for alcohol and drug.   Current Outpatient Medications:  .  amLODipine (NORVASC) 2.5 MG tablet, Take 2 tablets (5 mg total) by mouth daily., Disp: 90 tablet, Rfl: 3 .  hydrochlorothiazide (MICROZIDE) 12.5 MG capsule, TAKE 1 CAPSULE BY MOUTH ONCE DAILY WITH LOSARTAN, Disp: 90 capsule, Rfl: 3 .  losartan (COZAAR) 100 MG tablet, TAKE 1 TABLET BY MOUTH ONCE DAILY WITH HCTZ, Disp: 90 tablet, Rfl: 3 .  metFORMIN (GLUCOPHAGE) 500 MG tablet, Take 1 tablet (500 mg total) by mouth daily with breakfast., Disp: 90 tablet, Rfl: 3 .  metoprolol succinate (TOPROL-XL) 50 MG 24 hr tablet, TAKE 1 TABLET BY MOUTH DAILY. TAKE WITH OR IMMEDIATELY FOLLOWING A MEAL., Disp: 90 tablet, Rfl: 3 .  zolpidem (AMBIEN) 5 MG tablet, TAKE 1 TABLET BY MOUTH ONCE DAILY  AT BEDTIME AS NEEDED, Disp: 30 tablet, Rfl: 5 .  traMADol (ULTRAM) 50 MG tablet, Take 1 tablet (50 mg total) by mouth every 12 (twelve) hours as needed for up to 5 days., Disp: 15 tablet, Rfl: 0  Current Facility-Administered Medications:  .  triamcinolone acetonide (KENALOG) 10 MG/ML injection 10 mg, 10 mg, Other, Once, [place], [place], DPM .  triamcinolone acetonide (KENALOG-40) injection 20 mg, 20 mg, Other, Once, [place], [place], DPM  EXAM:  VITALS per patient if applicable:  GENERAL: alert, oriented, appears well and in no acute  distress  HEENT: atraumatic, conjunttiva clear, no obvious abnormalities on inspection of external nose and ears  NECK: normal movements of the head and neck  LUNGS: on inspection no signs of respiratory distress, breathing rate appears normal, no obvious gross SOB, gasping or wheezing  CV: no obvious cyanosis  MS: moves all visible extremities without noticeable abnormality  PSYCH/NEURO: pleasant and cooperative, no obvious depression or anxiety, speech and thought processing grossly intact  ASSESSMENT AND PLAN:  Discussed the following assessment and plan:  Uncontrolled hypertension  Ganglion cyst of foot  Uncontrolled hypertension Secondary causes suspected,  With OSA likely.  Increase amlodipine to 5 mg daily   ,  Continue losartan/hct/metoprolol .    Ganglion cyst of foot She has had considerable pain due to location on dorsum of foot,  And is scheduled for elective surgical excision     I discussed the assessment and treatment plan with the patient. The patient was provided an opportunity to ask questions and all were answered. The patient agreed with the plan and demonstrated an understanding of the instructions.   The patient was advised to call back or seek an in-person evaluation if the symptoms worsen or if the condition fails to improve as anticipated.  I provided  30 minutes of non-face-to-face time during this encounter reviewing patient's current problems and past surgeries, labs and imaging studies, providing counseling on the above mentioned problems , and coordination  of care .  [name], MD

## 2019-09-23 NOTE — Assessment & Plan Note (Signed)
She has had considerable pain due to location on dorsum of foot,  And is scheduled for elective surgical excision

## 2019-09-23 NOTE — Assessment & Plan Note (Signed)
Secondary causes suspected,  With OSA likely.  Increase amlodipine to 5 mg daily   ,  Continue losartan/hct/metoprolol .

## 2019-09-29 MED ORDER — ZOLPIDEM TARTRATE 5 MG PO TABS: ORAL_TABLET | ORAL | 5 refills | Status: DC

## 2019-09-29 MED ORDER — HYDROCHLOROTHIAZIDE 12.5 MG PO CAPS: 25.0000 mg | ORAL_CAPSULE | Freq: Every day | ORAL | 3 refills | Status: DC

## 2019-10-22 ENCOUNTER — Telehealth: Payer: Self-pay

## 2019-10-22 NOTE — Telephone Encounter (Signed)
DOS 11/04/2019  EXC GANGLION CYST LT - 28090 TARSAL EXOSTECTOMY LT - 28104  [facility] EFFECTIVE DATE - 06/11/2019  PLAN DEDUCTIBLE - $1500.00 WITH $127.73 REMAINING OUT OF POCKET - $4000 W/ $2612.73 REMAINING COPAY $0.00 COINSURANCE - 60%  SPOKE TO SUNDRAL AT [place] REF # [id number], SHE STATED NO PRECERT REQUIRED FOR CPT 28090 [place] 41660.

## 2019-10-25 ENCOUNTER — Encounter: Payer: Self-pay | Admitting: Internal Medicine

## 2019-10-25 ENCOUNTER — Telehealth (INDEPENDENT_AMBULATORY_CARE_PROVIDER_SITE_OTHER): Payer: 59 | Admitting: Internal Medicine

## 2019-10-25 DIAGNOSIS — I10 Essential (primary) hypertension: Secondary | ICD-10-CM | POA: Diagnosis not present

## 2019-10-25 MED ORDER — AMLODIPINE BESYLATE 2.5 MG PO TABS: 7.5000 mg | ORAL_TABLET | Freq: Every day | ORAL | 3 refills | Status: DC

## 2019-10-25 NOTE — Progress Notes (Signed)
Virtual Visit converted to Telephone Note   This visit type was conducted due to national recommendations for restrictions regarding the COVID-19 pandemic (e.g. social distancing).  This format is felt to be most appropriate for this patient at this time.  All issues noted in this document were discussed and addressed.  No physical exam was performed (except for noted visual exam findings with Video Visits).   I attempted to connect with@ on 10/26/19 at  4:00 PM EDT by a video enabled telemedicine application.   Interactive audio and video telecommunications were attempted between this provider and patient, however failed, due to patient having technical difficulties.  We continued and completed visit with audio only.   Location provider: work or home office Persons participating in the virtual visit: patient, provider  I discussed the limitations, risks, security and privacy concerns of performing an evaluation and management service by telephone and the availability of in person appointments. I also discussed with the patient that there may be a patient responsible charge related to this service. The patient expressed understanding and agreed to proceed.  Reason for visit: follow up on hypertension   HPI:  49 yr old female with hypertension on 4 medications,  Morbid obesity , presents for follow up after increasing amlodipine to 5 mg daily 2 weeks ago.  Currently taking 25 mg hctz , 100 mg losartan   50 mg Toprol XL as well.  Bps have ranged from 123/80. To  128/86  To 137/90 pulse has been in the 70's.  No side effects thus far.   Having foot surgery next week    ROS: See pertinent positives and negatives per HPI.  Past Medical History:  Diagnosis Date  . H/O pelvic inflammatory disease   . History of recurrent miscarriages, not currently pregnant   . Hypertension   . Obesity (BMI 30-39.9)   . Plantar fasciitis   . Reactive hypoglycemia 2011    Past Surgical History:  Procedure  Laterality Date  . CHOLECYSTECTOMY  2007   [facility]    Family History  Problem Relation Age of Onset  . Diabetes Mother   . Heart disease Father        CAD tobacco   . Cancer Maternal Grandmother        throat CA (snuff)  . Heart disease Paternal Aunt   . Heart disease Paternal Uncle   . Birth defects Paternal Uncle   . Heart disease Paternal Grandfather   . Breast cancer Neg Hx     SOCIAL HX: lives with mother and teenage daughter.  reports that she has never smoked. She has never used smokeless tobacco. No history on file for alcohol and drug.   Current Outpatient Medications:  .  amLODipine (NORVASC) 2.5 MG tablet, Take 3 tablets (7.5 mg total) by mouth daily., Disp: 90 tablet, Rfl: 3 .  hydrochlorothiazide (MICROZIDE) 12.5 MG capsule, Take 2 capsules (25 mg total) by mouth daily., Disp: 90 capsule, Rfl: 3 .  losartan (COZAAR) 100 MG tablet, TAKE 1 TABLET BY MOUTH ONCE DAILY WITH HCTZ, Disp: 90 tablet, Rfl: 3 .  metFORMIN (GLUCOPHAGE) 500 MG tablet, Take 1 tablet (500 mg total) by mouth daily with breakfast., Disp: 90 tablet, Rfl: 3 .  metoprolol succinate (TOPROL-XL) 50 MG 24 hr tablet, TAKE 1 TABLET BY MOUTH DAILY. TAKE WITH OR IMMEDIATELY FOLLOWING A MEAL., Disp: 90 tablet, Rfl: 3 .  zolpidem (AMBIEN) 5 MG tablet, TAKE 1 TABLET BY MOUTH ONCE DAILY AT BEDTIME AS NEEDED, Disp:  30 tablet, Rfl: 5  Current Facility-Administered Medications:  .  triamcinolone acetonide (KENALOG) 10 MG/ML injection 10 mg, 10 mg, Other, Once, [place], [place], DPM .  triamcinolone acetonide (KENALOG-40) injection 20 mg, 20 mg, Other, Once, [place], [place], DPM  EXAM:   General impression: alert, cooperative and articulate.  No signs of being in distress  Lungs: speech is fluent sentence length suggests that patient is not short of breath and not punctuated by cough, sneezing or sniffing. [name]   Psych: affect normal.  speech is articulate and non pressured .  Denies suicidal thoughts   ASSESSMENT AND  PLAN:  Discussed the following assessment and plan:  Uncontrolled hypertension  Uncontrolled hypertension Improved with addition of amlodipine but not at goal .. increase dose to 7.5 mg daily.  Continue metoprolol losartan and hctz.  Needs to have secondary causes ruled out,  includin RAS and OSA     I discussed the assessment and treatment plan with the patient. The patient was provided an opportunity to ask questions and all were answered. The patient agreed with the plan and demonstrated an understanding of the instructions.   The patient was advised to call back or seek an in-person evaluation if the symptoms worsen or if the condition fails to improve as anticipated.  I provided 22 minutes of non-face-to-face time during this encounter.   [name], MD

## 2019-10-26 NOTE — Assessment & Plan Note (Signed)
Improved with addition of amlodipine but not at goal .. increase dose to 7.5 mg daily.  Continue metoprolol losartan and hctz.  Needs to have secondary causes ruled out,  includin RAS and OSA

## 2019-11-04 ENCOUNTER — Encounter: Payer: Self-pay | Admitting: Podiatry

## 2019-11-04 ENCOUNTER — Other Ambulatory Visit: Payer: Self-pay | Admitting: Podiatry

## 2019-11-04 DIAGNOSIS — I10 Essential (primary) hypertension: Secondary | ICD-10-CM | POA: Diagnosis not present

## 2019-11-04 DIAGNOSIS — D1632 Benign neoplasm of short bones of left lower limb: Secondary | ICD-10-CM | POA: Diagnosis not present

## 2019-11-04 DIAGNOSIS — M898X7 Other specified disorders of bone, ankle and foot: Secondary | ICD-10-CM | POA: Diagnosis not present

## 2019-11-04 DIAGNOSIS — M67472 Ganglion, left ankle and foot: Secondary | ICD-10-CM | POA: Diagnosis not present

## 2019-11-04 HISTORY — PX: FOOT SURGERY: SHX648

## 2019-11-04 MED ORDER — HYDROMORPHONE HCL 2 MG PO TABS: 2.0000 mg | ORAL_TABLET | ORAL | 0 refills | Status: DC | PRN

## 2019-11-04 MED ORDER — IBUPROFEN 800 MG PO TABS: 800.0000 mg | ORAL_TABLET | Freq: Three times a day (TID) | ORAL | 1 refills | Status: DC

## 2019-11-04 NOTE — Progress Notes (Signed)
PRN postop 

## 2019-11-06 ENCOUNTER — Encounter: Payer: Self-pay | Admitting: Podiatry

## 2019-11-12 ENCOUNTER — Encounter: Payer: 59 | Admitting: Podiatry

## 2019-11-16 ENCOUNTER — Ambulatory Visit (INDEPENDENT_AMBULATORY_CARE_PROVIDER_SITE_OTHER): Payer: 59

## 2019-11-16 ENCOUNTER — Other Ambulatory Visit: Payer: Self-pay

## 2019-11-16 ENCOUNTER — Ambulatory Visit (INDEPENDENT_AMBULATORY_CARE_PROVIDER_SITE_OTHER): Payer: 59 | Admitting: Podiatry

## 2019-11-16 DIAGNOSIS — Z9889 Other specified postprocedural states: Secondary | ICD-10-CM

## 2019-11-16 DIAGNOSIS — M898X7 Other specified disorders of bone, ankle and foot: Secondary | ICD-10-CM

## 2019-11-16 NOTE — Progress Notes (Signed)
   Subjective:  Patient presents today status post excision of ganglion cyst with exostectomy left dorsal midfoot.. DOS: 11/04/2019. Patient states that she is doing well, however she was unable to take the Dilaudid medication. It seems that all opioid pain medications make her sick including tramadol, hydrocodone, oxycodone, and now Dilaudid. She is only been taking ibuprofen for pain. She only has slight pain and it has improved over the past week. No new complaints at this time  Past Medical History:  Diagnosis Date  . H/O pelvic inflammatory disease   . History of recurrent miscarriages, not currently pregnant   . Hypertension   . Obesity (BMI 30-39.9)   . Plantar fasciitis   . Reactive hypoglycemia 2011      Objective/Physical Exam Neurovascular status intact.  Skin incisions appear to be well coapted with sutures and staples intact. No sign of infectious process noted. No dehiscence. No active bleeding noted. Moderate edema noted to the surgical extremity.  Radiographic Exam:  Osteotomies sites appear to be stable with routine healing.  Assessment: 1. s/p excision ganglion cyst with dorsal exostectomy left foot. DOS: 11/04/2019   Plan of Care:  1. Patient was evaluated. X-rays reviewed 2. Dressings changed today 3. Discontinue cam boot. Postoperative shoe dispensed. Weightbearing as tolerated 4. Recommend Betadine ointment and Ace wrap daily. Supplies provided 5. Return to clinic in 2 weeks for suture removal   Edrick Kins, DPM [facility]  Dr. Edrick Kins, [address]                                        [place], [place] [address]                Office [phone]  Fax [phone]

## 2019-11-19 ENCOUNTER — Encounter: Payer: 59 | Admitting: Podiatry

## 2019-11-29 ENCOUNTER — Encounter: Payer: Self-pay | Admitting: Podiatry

## 2019-12-03 ENCOUNTER — Ambulatory Visit (INDEPENDENT_AMBULATORY_CARE_PROVIDER_SITE_OTHER): Payer: 59 | Admitting: Podiatry

## 2019-12-03 ENCOUNTER — Encounter: Payer: Self-pay | Admitting: Podiatry

## 2019-12-03 ENCOUNTER — Other Ambulatory Visit: Payer: Self-pay

## 2019-12-03 DIAGNOSIS — M898X7 Other specified disorders of bone, ankle and foot: Secondary | ICD-10-CM

## 2019-12-03 DIAGNOSIS — Z9889 Other specified postprocedural states: Secondary | ICD-10-CM

## 2019-12-03 NOTE — Progress Notes (Signed)
   Subjective:  Patient presents today status post excision of ganglion cyst with exostectomy left dorsal midfoot.. DOS: 11/04/2019.  Patient has been wearing the surgical shoe as instructed.  She states that she has had a little bit of tenderness around the incision site.  No new complaints at this time.  Past Medical History:  Diagnosis Date  . H/O pelvic inflammatory disease   . History of recurrent miscarriages, not currently pregnant   . Hypertension   . Obesity (BMI 30-39.9)   . Plantar fasciitis   . Reactive hypoglycemia 2011      Objective/Physical Exam Neurovascular status intact.  Skin incisions appear to be well coapted with staples intact. No sign of infectious process noted. No dehiscence. No active bleeding noted.  There is some minimal puffiness and edema just around the incision site.   Assessment: 1. s/p excision ganglion cyst with dorsal exostectomy left foot. DOS: 11/04/2019   Plan of Care:  1. Patient was evaluated.  2.  Staples removed today. 3.  Discontinue postoperative shoe.  Recommend good supportive shoes that do not irritate the top of the foot 4.  Return to clinic in 3 weeks for final follow-up x-ray  Referral coordinator at [facility]  Edrick Kins, DPM [facility]  Dr. Edrick Kins, [address]                                        [place], [place] [address]                Office [phone]  Fax [phone]

## 2019-12-15 ENCOUNTER — Other Ambulatory Visit: Payer: Self-pay | Admitting: Internal Medicine

## 2019-12-28 ENCOUNTER — Ambulatory Visit (INDEPENDENT_AMBULATORY_CARE_PROVIDER_SITE_OTHER): Payer: 59 | Admitting: Podiatry

## 2019-12-28 ENCOUNTER — Encounter: Payer: Self-pay | Admitting: Podiatry

## 2019-12-28 ENCOUNTER — Other Ambulatory Visit: Payer: Self-pay

## 2019-12-28 DIAGNOSIS — M67472 Ganglion, left ankle and foot: Secondary | ICD-10-CM

## 2019-12-28 DIAGNOSIS — M898X7 Other specified disorders of bone, ankle and foot: Secondary | ICD-10-CM

## 2019-12-28 DIAGNOSIS — Z9889 Other specified postprocedural states: Secondary | ICD-10-CM

## 2019-12-28 NOTE — Progress Notes (Signed)
   Subjective:  Patient presents today status post excision of ganglion cyst with exostectomy left dorsal midfoot.. DOS: 11/04/2019.  Patient states that she is doing very well.  She is walking with minimal pain.  She does have some slight sensitivity with swelling to the incision site.  No new complaints at this time.  Past Medical History:  Diagnosis Date  . H/O pelvic inflammatory disease   . History of recurrent miscarriages, not currently pregnant   . Hypertension   . Obesity (BMI 30-39.9)   . Plantar fasciitis   . Reactive hypoglycemia 2011      Objective/Physical Exam Neurovascular status intact.  Skin incisions appear to be well coapted and healed. No sign of infectious process noted. No dehiscence. No active bleeding noted.  There continues to be some minimal edema just around the incision site.  There is also some sensitivity to light touch along the incision site.  Assessment: 1. s/p excision ganglion cyst with dorsal exostectomy left foot. DOS: 11/04/2019   Plan of Care:  1. Patient was evaluated.  2.  Recommend Mederma scar cream to massage daily.  Explained that massaging the area may help to break up any scar tissue adhesions and reduce the sensitivity 3.  Recommend wearing good comfortable supportive sneakers 4.  Patient may resume full activity no restrictions.  Return to clinic as needed  Referral coordinator at [facility]  Edrick Kins, DPM [facility]  Dr. Edrick Kins, [address]                                        [place], [place] [address]                Office [phone]  Fax [phone]

## 2019-12-29 ENCOUNTER — Other Ambulatory Visit: Payer: Self-pay | Admitting: Internal Medicine

## 2019-12-29 MED ORDER — AMLODIPINE BESYLATE 5 MG PO TABS: 7.5000 mg | ORAL_TABLET | Freq: Every day | ORAL | 3 refills | Status: DC

## 2019-12-29 MED ORDER — LOSARTAN POTASSIUM-HCTZ 100-25 MG PO TABS: 1.0000 | ORAL_TABLET | Freq: Every day | ORAL | 3 refills | Status: DC

## 2020-02-02 ENCOUNTER — Encounter: Payer: Self-pay | Admitting: Obstetrics and Gynecology

## 2020-02-02 ENCOUNTER — Ambulatory Visit (INDEPENDENT_AMBULATORY_CARE_PROVIDER_SITE_OTHER): Payer: 59 | Admitting: Obstetrics and Gynecology

## 2020-02-02 ENCOUNTER — Other Ambulatory Visit: Payer: Self-pay

## 2020-02-02 VITALS — BP 111/74 | HR 65 | Ht 66.0 in | Wt 264.0 lb

## 2020-02-02 DIAGNOSIS — Z01419 Encounter for gynecological examination (general) (routine) without abnormal findings: Secondary | ICD-10-CM

## 2020-02-02 DIAGNOSIS — Z6841 Body Mass Index (BMI) 40.0 and over, adult: Secondary | ICD-10-CM

## 2020-02-02 DIAGNOSIS — Z1231 Encounter for screening mammogram for malignant neoplasm of breast: Secondary | ICD-10-CM

## 2020-02-02 NOTE — Progress Notes (Signed)
HPI:      [name] is a 49 y.o. No obstetric history on file. who LMP was Patient's last menstrual period was 01/24/2020 (approximate).  Subjective:   She presents today for her annual examination.  She is very happy with her IUD.  Occasionally has some spotting but it is not often. She recently had some foot surgery but seems to be doing well from this. She has no complaints.    Hx: The following portions of the patient's history were reviewed and updated as appropriate:             She  has a past medical history of H/O pelvic inflammatory disease, History of recurrent miscarriages, not currently pregnant, Hypertension, Obesity (BMI 30-39.9), Plantar fasciitis, and Reactive hypoglycemia (2011). She does not have any pertinent problems on file. She  has a past surgical history that includes Cholecystectomy (2007) and Foot surgery (11/04/2019). Her family history includes Birth defects in her paternal uncle; Cancer in her maternal grandmother; Diabetes in her mother; Heart disease in her father, paternal aunt, paternal grandfather, and paternal uncle. She  reports that she has never smoked. She has never used smokeless tobacco. No history on file for alcohol use and drug use. She has a current medication list which includes the following prescription(s): amlodipine, ibuprofen, losartan-hydrochlorothiazide, metformin, metoprolol succinate, and zolpidem, and the following Facility-Administered Medications: triamcinolone acetonide and triamcinolone acetonide. She is allergic to oyster extract.       Review of Systems:  Review of Systems  Constitutional: Denied constitutional symptoms, night sweats, recent illness, fatigue, fever, insomnia and weight loss.  Eyes: Denied eye symptoms, eye pain, photophobia, vision change and visual disturbance.  Ears/Nose/Throat/Neck: Denied ear, nose, throat or neck symptoms, hearing loss, nasal discharge, sinus congestion and sore throat.   Cardiovascular: Denied cardiovascular symptoms, arrhythmia, chest pain/pressure, edema, exercise intolerance, orthopnea and palpitations.  Respiratory: Denied pulmonary symptoms, asthma, pleuritic pain, productive sputum, cough, dyspnea and wheezing.  Gastrointestinal: Denied, gastro-esophageal reflux, melena, nausea and vomiting.  Genitourinary: Denied genitourinary symptoms including symptomatic vaginal discharge, pelvic relaxation issues, and urinary complaints.  Musculoskeletal: Denied musculoskeletal symptoms, stiffness, swelling, muscle weakness and myalgia.  Dermatologic: Denied dermatology symptoms, rash and scar.  Neurologic: Denied neurology symptoms, dizziness, headache, neck pain and syncope.  Psychiatric: Denied psychiatric symptoms, anxiety and depression.  Endocrine: Denied endocrine symptoms including hot flashes and night sweats.   Meds:   Current Outpatient Medications on File Prior to Visit  Medication Sig Dispense Refill  . amLODipine (NORVASC) 5 MG tablet Take 1.5 tablets (7.5 mg total) by mouth daily. 135 tablet 3  . ibuprofen (ADVIL) 800 MG tablet Take 1 tablet (800 mg total) by mouth 3 (three) times daily. 90 tablet 1  . losartan-hydrochlorothiazide (HYZAAR) 100-25 MG tablet Take 1 tablet by mouth daily. 90 tablet 3  . metFORMIN (GLUCOPHAGE) 500 MG tablet TAKE 1 TABLET BY MOUTH DAILY WITH BREAKFAST. 90 tablet 3  . metoprolol succinate (TOPROL-XL) 50 MG 24 hr tablet TAKE 1 TABLET BY MOUTH DAILY. TAKE WITH OR IMMEDIATELY FOLLOWING A MEAL. 90 tablet 3  . zolpidem (AMBIEN) 5 MG tablet TAKE 1 TABLET BY MOUTH ONCE DAILY AT BEDTIME AS NEEDED 30 tablet 5   Current Facility-Administered Medications on File Prior to Visit  Medication Dose Route Frequency Provider Last Rate Last Admin  . triamcinolone acetonide (KENALOG) 10 MG/ML injection 10 mg  10 mg Other Once Landis Martins, DPM      . triamcinolone acetonide (KENALOG-40) injection 20 mg  20 mg Other Once Landis Martins,  DPM        Objective:     Vitals:   02/02/20 0758  BP: 111/74  Pulse: 65              Physical examination General NAD, Conversant  HEENT Atraumatic; Op clear with mmm.  Normo-cephalic. Pupils reactive. Anicteric sclerae  Thyroid/Neck Smooth without nodularity or enlargement. Normal ROM.  Neck Supple.  Skin No rashes, lesions or ulceration. Normal palpated skin turgor. No nodularity.  Breasts: No masses or discharge.  Symmetric.  No axillary adenopathy.  Lungs: Clear to auscultation.No rales or wheezes. Normal Respiratory effort, no retractions.  Heart: NSR.  No murmurs or rubs appreciated. No periferal edema  Abdomen: Soft.  Non-tender.  No masses.  No HSM. No hernia  Extremities: Moves all appropriately.  Normal ROM for age. No lymphadenopathy.  Neuro: Oriented to PPT.  Normal mood. Normal affect.     Pelvic:   Vulva: Normal appearance.  No lesions.  Vagina: No lesions or abnormalities noted.  Support: Normal pelvic support.  Urethra No masses tenderness or scarring.  Meatus Normal size without lesions or prolapse.  Cervix: Normal appearance.  No lesions.  IUD strings noted at cervical os-normal length  Anus: Normal exam.  No lesions.  Perineum: Normal exam.  No lesions.        Bimanual   Uterus: Normal size.  Non-tender.  Mobile.  AV.  Adnexae: No masses.  Non-tender to palpation.  Cul-de-sac: Negative for abnormality.      Assessment:    No obstetric history on file. Patient Active Problem List   Diagnosis Date Noted  . Acute hip pain, right 05/15/2019  . Ganglion cyst of foot 01/24/2019  . [name] 12/31/2018  . Morbid obesity ([facility]) 12/31/2018  . Sciatica without back pain 12/09/2018  . Educated about COVID-19 virus infection 12/09/2018  . Insomnia 07/22/2014  . Snoring 05/03/2013  . Visit for preventive health examination 10/17/2011  . H/O pelvic inflammatory disease   . Reactive hypoglycemia   . History of recurrent miscarriages, not currently  pregnant   . Morbid obesity with BMI of 40.0-44.9, adult ([facility]) 07/17/2011  . Uncontrolled hypertension 07/17/2011     1. Well woman exam with routine gynecological exam   2. Encounter for screening mammogram for malignant neoplasm of breast   3. Class 3 severe obesity due to excess calories without serious comorbidity with body mass index (BMI) of 40.0 to 44.9 in adult [facility])     Patient doing well with IUD.  No GYN complaints.    Plan:            1.  Basic Screening Recommendations The basic screening recommendations for asymptomatic women were discussed with the patient during her visit.  The age-appropriate recommendations were discussed with her and the rational for the tests reviewed.  When I am informed by the patient that another primary care physician has previously obtained the age-appropriate tests and they are up-to-date, only outstanding tests are ordered and referrals given as necessary.  Abnormal results of tests will be discussed with her when all of her results are completed.  Routine preventative health maintenance measures emphasized: Exercise/Diet/Weight control, Tobacco Warnings, Alcohol/Substance use risks and Stress Management Mammogram ordered Orders Orders Placed This Encounter  Procedures  . MM 3D SCREEN BREAST BILATERAL    No orders of the defined types were placed in this encounter.           F/U  Return  in about 1 year (around 02/01/2021) for Annual Physical.  Finis Bud, M.D. 02/02/2020 8:46 AM

## 2020-03-07 NOTE — Telephone Encounter (Signed)
I called to patient to see if she could do 1:30 appointment with Dr.Tullo instead. I also let her know that Dr.Tullo preferred in office. Patient stated that she could do this &she is scheduled.

## 2020-03-10 ENCOUNTER — Encounter: Payer: Self-pay | Admitting: Internal Medicine

## 2020-03-10 ENCOUNTER — Ambulatory Visit (INDEPENDENT_AMBULATORY_CARE_PROVIDER_SITE_OTHER): Payer: 59

## 2020-03-10 ENCOUNTER — Other Ambulatory Visit: Payer: Self-pay | Admitting: Internal Medicine

## 2020-03-10 ENCOUNTER — Other Ambulatory Visit: Payer: Self-pay

## 2020-03-10 ENCOUNTER — Ambulatory Visit (INDEPENDENT_AMBULATORY_CARE_PROVIDER_SITE_OTHER): Payer: 59 | Admitting: Internal Medicine

## 2020-03-10 VITALS — BP 118/82 | HR 79 | Temp 98.5°F | Ht 65.98 in | Wt 263.8 lb

## 2020-03-10 DIAGNOSIS — M25511 Pain in right shoulder: Secondary | ICD-10-CM | POA: Diagnosis not present

## 2020-03-10 DIAGNOSIS — I10 Essential (primary) hypertension: Secondary | ICD-10-CM

## 2020-03-10 DIAGNOSIS — E161 Other hypoglycemia: Secondary | ICD-10-CM | POA: Diagnosis not present

## 2020-03-10 DIAGNOSIS — R531 Weakness: Secondary | ICD-10-CM | POA: Diagnosis not present

## 2020-03-10 DIAGNOSIS — Z6841 Body Mass Index (BMI) 40.0 and over, adult: Secondary | ICD-10-CM | POA: Diagnosis not present

## 2020-03-10 DIAGNOSIS — M25512 Pain in left shoulder: Secondary | ICD-10-CM | POA: Diagnosis not present

## 2020-03-10 DIAGNOSIS — R29898 Other symptoms and signs involving the musculoskeletal system: Secondary | ICD-10-CM | POA: Diagnosis not present

## 2020-03-10 DIAGNOSIS — M5412 Radiculopathy, cervical region: Secondary | ICD-10-CM | POA: Diagnosis not present

## 2020-03-10 DIAGNOSIS — R200 Anesthesia of skin: Secondary | ICD-10-CM | POA: Diagnosis not present

## 2020-03-10 MED ORDER — GABAPENTIN 100 MG PO CAPS: ORAL_CAPSULE | ORAL | 3 refills | Status: DC

## 2020-03-10 NOTE — Addendum Note (Signed)
Addended by: Leeanne Rio on: 03/10/2020 03:53 PM   Modules accepted: Orders

## 2020-03-10 NOTE — Assessment & Plan Note (Signed)
Strongly urged to resume walking program and continued efforts at portion reduction

## 2020-03-10 NOTE — Patient Instructions (Signed)
Carpal Tunnel Syndrome  Carpal tunnel syndrome is a condition that causes pain in your hand and arm. The carpal tunnel is a narrow area that is on the palm side of your wrist. Repeated wrist motion or certain diseases may cause swelling in the tunnel. This swelling can pinch the main nerve in the wrist (median nerve). What are the causes? This condition may be caused by:  Repeated wrist motions.  Wrist injuries.  Arthritis.  A sac of fluid (cyst) or abnormal growth (tumor) in the carpal tunnel.  Fluid buildup during pregnancy. Sometimes the cause is not known. What increases the risk? The following factors may make you more likely to develop this condition:  Having a job in which you move your wrist in the same way many times. This includes jobs like being a butcher or a cashier.  Being a woman.  Having other health conditions, such as: ? Diabetes. ? Obesity. ? A thyroid gland that is not active enough (hypothyroidism). ? Kidney failure. What are the signs or symptoms? Symptoms of this condition include:  A tingling feeling in your fingers.  Tingling or a loss of feeling (numbness) in your hand.  Pain in your entire arm. This pain may get worse when you bend your wrist and elbow for a long time.  Pain in your wrist that goes up your arm to your shoulder.  Pain that goes down into your palm or fingers.  A weak feeling in your hands. You may find it hard to grab and hold items. You may feel worse at night. How is this diagnosed? This condition is diagnosed with a medical history and physical exam. You may also have tests, such as:  Electromyogram (EMG). This test checks the signals that the nerves send to the muscles.  Nerve conduction study. This test checks how well signals pass through your nerves.  Imaging tests, such as X-rays, ultrasound, and MRI. These tests check for what might be the cause of your condition. How is this treated? This condition may be treated  with:  Lifestyle changes. You will be asked to stop or change the activity that caused your problem.  Doing exercise and activities that make bones and muscles stronger (physical therapy).  Learning how to use your hand again (occupational therapy).  Medicines for pain and swelling (inflammation). You may have injections in your wrist.  A wrist splint.  Surgery. Follow these instructions at home: If you have a splint:  Wear the splint as told by your doctor. Remove it only as told by your doctor.  Loosen the splint if your fingers: ? Tingle. ? Lose feeling (become numb). ? Turn cold and blue.  Keep the splint clean.  If the splint is not waterproof: ? Do not let it get wet. ? Cover it with a watertight covering when you take a bath or a shower. Managing pain, stiffness, and swelling   If told, put ice on the painful area: ? If you have a removable splint, remove it as told by your doctor. ? Put ice in a plastic bag. ? Place a towel between your skin and the bag. ? Leave the ice on for 20 minutes, 2-3 times per day. General instructions  Take over-the-counter and prescription medicines only as told by your doctor.  Rest your wrist from any activity that may cause pain. If needed, talk with your boss at work about changes that can help your wrist heal.  Do any exercises as told by your doctor,   physical therapist, or occupational therapist.  Keep all follow-up visits as told by your doctor. This is important. Contact a doctor if:  You have new symptoms.  Medicine does not help your pain.  Your symptoms get worse. Get help right away if:  You have very bad numbness or tingling in your wrist or hand. Summary  Carpal tunnel syndrome is a condition that causes pain in your hand and arm.  It is often caused by repeated wrist motions.  Lifestyle changes and medicines are used to treat this problem. Surgery may help in very bad cases.  Follow your doctor's  instructions about wearing a splint, resting your wrist, keeping follow-up visits, and calling for help. This information is not intended to replace advice given to you by your health care provider. Make sure you discuss any questions you have with your health care provider. Document Revised: 10/03/2017 Document Reviewed: 10/03/2017 Elsevier Patient Education  2020 [employer]  

## 2020-03-10 NOTE — Assessment & Plan Note (Signed)
With numbness . CTS vs cervical radiculopathy. Plain x rays ordered.  If normal,  Will recommend neurology evaluation with EMG/nerve conduction studies

## 2020-03-10 NOTE — Assessment & Plan Note (Signed)
Well controlled currently on  regimen of amlodipine 5 mg daily ,  losartan/hct 100/25,  And metoprolol 50 mg daily  .  Renal function is overdue

## 2020-03-10 NOTE — Progress Notes (Signed)
Subjective:  Patient ID: Melanie Chavez, female    DOB: 1970-12-23  Age: 49 y.o. MRN: [id number]  CC: The primary encounter diagnosis was Weakness of right hand. Diagnoses of Cervical radiculopathy, Morbid obesity with BMI of 40.0-44.9, adult ([place]), Reactive hypoglycemia, Essential hypertension, and Morbid obesity ([place]) were also pertinent to this visit.  HPI Melanie Chavez presents for evaluation of numbness and tingling in the right arm.   This visit occurred during the SARS-CoV-2 public health emergency.  Safety protocols were in place, including screening questions prior to the visit, additional usage of staff PPE, and extensive cleaning of exam room while observing appropriate contact time as indicated for disinfecting solutions.   Right arm symptoms have been present for the last month. Did not resolve with change In sleeping position .  Wakes up with arm completely numb, feels like pins and needles and But pain nad numbness persist in the thumb, index finger and middle finger.  Arm numbness resolves during the day unless she raises her arms for prlonged periods over 5 minutes  Painful at night feels like needles and pins at times.  Thumb, index and middle finger never completely resolves.  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   Thenar wasting , early , noted on the right   IUD placed by [facility] since last visit for DUB.   Mammogram ordered but not set up.   Has lost 7 lbs since last visit by Cutting back on portions.   No longer walking regularly since daughter went off to college.    Outpatient Medications Prior to Visit  Medication Sig Dispense Refill  . amLODipine (NORVASC) 5 MG tablet Take 1.5 tablets (7.5 mg total) by mouth daily. 135 tablet 3  . ibuprofen (ADVIL) 800 MG tablet Take 1 tablet (800 mg total) by mouth 3 (three) times daily. 90 tablet 1  . losartan-hydrochlorothiazide  (HYZAAR) 100-25 MG tablet Take 1 tablet by mouth daily. 90 tablet 3  . metFORMIN (GLUCOPHAGE) 500 MG tablet TAKE 1 TABLET BY MOUTH DAILY WITH BREAKFAST. 90 tablet 3  . metoprolol succinate (TOPROL-XL) 50 MG 24 hr tablet TAKE 1 TABLET BY MOUTH DAILY. TAKE WITH OR IMMEDIATELY FOLLOWING A MEAL. 90 tablet 3  . zolpidem (AMBIEN) 5 MG tablet TAKE 1 TABLET BY MOUTH ONCE DAILY AT BEDTIME AS NEEDED 30 tablet 5   Facility-Administered Medications Prior to Visit  Medication Dose Route Frequency Provider Last Rate Last Admin  . triamcinolone acetonide (KENALOG) 10 MG/ML injection 10 mg  10 mg Other Once [place], Titorya, DPM      . triamcinolone acetonide (KENALOG-40) injection 20 mg  20 mg Other Once Landis Martins, DPM        Review of Systems;  Patient denies headache, fevers, malaise, unintentional weight loss, skin rash, eye pain, sinus congestion and sinus pain, sore throat, dysphagia,  hemoptysis , cough, dyspnea, wheezing, chest pain, palpitations, orthopnea, edema, abdominal pain, nausea, melena, diarrhea, constipation, flank pain, dysuria, hematuria, urinary  Frequency, nocturia, numbness, tingling, seizures,  Focal weakness, Loss of consciousness,  Tremor, insomnia, depression, anxiety, and suicidal ideation.      Objective:  BP 118/82 (BP Location: Left Arm, Patient Position: Sitting)   Pulse 79   Temp 98.5 F (36.9 C)   Ht 5' 5.98" (1.676 m)   Wt 263 lb 12.8 oz (119.7 kg)   SpO2 98%   BMI 42.60  kg/m   BP Readings from Last 3 Encounters:  03/10/20 118/82  02/02/20 111/74  10/25/19 123/80    Wt Readings from Last 3 Encounters:  03/10/20 263 lb 12.8 oz (119.7 kg)  02/02/20 264 lb (119.7 kg)  10/25/19 270 lb (122.5 kg)    General appearance: alert, cooperative and appears stated age Ears: normal TM's and external ear canals both ears Throat: lips, mucosa, and tongue normal; teeth and gums normal Neck: no adenopathy, no carotid bruit, supple, symmetrical, trachea midline and  thyroid not enlarged, symmetric, no spinal tenderness/mass/nodules Back: symmetric, no curvature. ROM normal. No CVA tenderness. Lungs: clear to auscultation bilaterally Heart: regular rate and rhythm, S1, S2 normal, no murmur, click, rub or gallop Abdomen: soft, non-tender; bowel sounds normal; no masses,  no organomegaly Pulses: 2+ and symmetric Skin: Skin color, texture, turgor normal. No rashes or lesions Lymph nodes: Cervical, supraclavicular, and axillary nodes normal. DTR:s   Difficult to elicit due to morbid obesity.  MSK:   Right hand weaker than left ,  Thenar wasting noted (early)   Lab Results  Component Value Date   HGBA1C 5.7 01/18/2019   HGBA1C 5.7 09/24/2018   HGBA1C 5.7 12/03/2017    Lab Results  Component Value Date   CREATININE 0.75 09/24/2018   CREATININE 0.76 12/03/2017   CREATININE 0.92 03/19/2017    Lab Results  Component Value Date   WBC 4.9 01/18/2019   HGB 12.0 01/18/2019   HCT 36.7 01/18/2019   PLT 376.0 01/18/2019   GLUCOSE 87 09/24/2018   CHOL 118 01/18/2019   TRIG 49.0 01/18/2019   HDL 41.90 01/18/2019   LDLDIRECT 54.0 08/07/2015   LDLCALC 66 01/18/2019   ALT 16 09/24/2018   AST 18 09/24/2018   NA 137 09/24/2018   K 4.1 09/24/2018   CL 104 09/24/2018   CREATININE 0.75 09/24/2018   BUN 11 09/24/2018   CO2 25 09/24/2018   TSH 1.49 01/18/2019   HGBA1C 5.7 01/18/2019   MICROALBUR 5.4 (H) 03/19/2017    MM 3D SCREEN BREAST BILATERAL  Result Date: 05/03/2019 CLINICAL DATA:  Screening. EXAM: DIGITAL SCREENING BILATERAL MAMMOGRAM WITH TOMO AND CAD COMPARISON:  Previous exam(s). ACR Breast Density Category b: There are scattered areas of fibroglandular density. FINDINGS: There are no findings suspicious for malignancy. Images were processed with CAD. IMPRESSION: No mammographic evidence of malignancy. A result letter of this screening mammogram will be mailed directly to the patient. RECOMMENDATION: Screening mammogram in one year.  (Code:SM-B-01Y) BI-RADS CATEGORY  1: Negative. Electronically Signed   By: Lajean Manes M.D.   On: 05/03/2019 10:09    Assessment & Plan:   Problem List Items Addressed This Visit      Unprioritized   Reactive hypoglycemia   Relevant Orders   Comprehensive metabolic panel   Hemoglobin A1c   Morbid obesity with BMI of 40.0-44.9, adult ([facility])   Relevant Orders   Lipid panel   TSH   Essential hypertension    Well controlled currently on  regimen of amlodipine 5 mg daily ,  losartan/hct 100/25,  And metoprolol 50 mg daily  .  Renal function is overdue       Morbid obesity ([place])    Strongly urged to resume walking program and continued efforts at portion reduction       Weakness of right hand - Primary    With numbness . CTS vs cervical radiculopathy. Plain x rays ordered.  If normal,  Will recommend neurology evaluation with EMG/nerve conduction studies  Relevant Orders   DG Cervical Spine Complete    Other Visit Diagnoses    Cervical radiculopathy       Relevant Medications   gabapentin (NEURONTIN) 100 MG capsule   Other Relevant Orders   DG Cervical Spine Complete      I am having Melanie Chavez start on gabapentin. I am also having her maintain her metoprolol succinate, zolpidem, ibuprofen, metFORMIN, losartan-hydrochlorothiazide, and amLODipine. We will continue to administer triamcinolone acetonide and triamcinolone acetonide.  Meds ordered this encounter  Medications  . gabapentin (NEURONTIN) 100 MG capsule    Sig: 1 to 3 capsules  Daily before bedtime    Dispense:  90 capsule    Refill:  3    There are no discontinued medications.  Follow-up: No follow-ups on file.   [name], MD

## 2020-03-12 NOTE — Progress Notes (Signed)
The neck x rays make the possibility that your hand numbness is due to a compressed nerve root in your spine, as opposed to carpal tunnel syndrome in your wrist.  You will need to have Nerve conduction studies  done by a neurologist  to determine the actual cause,  so I will make a referral if you are willing to see one. Let me know if you prefer seeing the [facility] in [place] , or one of the [facility] neurologists in [place]

## 2020-03-13 ENCOUNTER — Other Ambulatory Visit: Payer: Self-pay

## 2020-03-13 ENCOUNTER — Other Ambulatory Visit (INDEPENDENT_AMBULATORY_CARE_PROVIDER_SITE_OTHER): Payer: 59

## 2020-03-13 DIAGNOSIS — Z6841 Body Mass Index (BMI) 40.0 and over, adult: Secondary | ICD-10-CM

## 2020-03-13 DIAGNOSIS — E161 Other hypoglycemia: Secondary | ICD-10-CM

## 2020-03-13 LAB — TSH: TSH: 0.99 u[IU]/mL (ref 0.35–4.50)

## 2020-03-13 LAB — COMPREHENSIVE METABOLIC PANEL
ALT: 16 U/L (ref 0–35)
AST: 19 U/L (ref 0–37)
Albumin: 4.3 g/dL (ref 3.5–5.2)
Alkaline Phosphatase: 47 U/L (ref 39–117)
BUN: 8 mg/dL (ref 6–23)
CO2: 25 mEq/L (ref 19–32)
Calcium: 8.9 mg/dL (ref 8.4–10.5)
Chloride: 104 mEq/L (ref 96–112)
Creatinine, Ser: 0.68 mg/dL (ref 0.40–1.20)
GFR: 111.75 mL/min (ref >60.00)
Glucose, Bld: 81 mg/dL (ref 70–99)
Potassium: 3.6 mEq/L (ref 3.5–5.1)
Sodium: 138 mEq/L (ref 135–145)
Total Bilirubin: 0.7 mg/dL (ref 0.2–1.2)
Total Protein: 7.4 g/dL (ref 6.0–8.3)

## 2020-03-13 LAB — LIPID PANEL
Cholesterol: 110 mg/dL (ref 0–200)
HDL: 40 mg/dL (ref >39.00)
LDL Cholesterol: 58 mg/dL (ref 0–99)
NonHDL: 69.94
Total CHOL/HDL Ratio: 3
Triglycerides: 61 mg/dL (ref 0.0–149.0)
VLDL: 12.2 mg/dL (ref 0.0–40.0)

## 2020-03-13 LAB — HEMOGLOBIN A1C: Hgb A1c MFr Bld: 5.7 % (ref 4.6–6.5)

## 2020-03-14 NOTE — Progress Notes (Signed)
All of your labs are normal,. Including thyroid function  and  cholesterol.  Your A1c is at the upper limit of normal,  which suggests that over time you are more likely to develop diabetes if you do not make the lifestyle changes. We discussed at your visit (exercise and gradual weight loss)   Regards,   Deborra Medina, MD

## 2020-03-16 ENCOUNTER — Other Ambulatory Visit: Payer: Self-pay | Admitting: Internal Medicine

## 2020-03-16 DIAGNOSIS — R29898 Other symptoms and signs involving the musculoskeletal system: Secondary | ICD-10-CM

## 2020-04-10 ENCOUNTER — Other Ambulatory Visit: Payer: Self-pay | Admitting: Internal Medicine

## 2020-04-11 ENCOUNTER — Other Ambulatory Visit: Payer: Self-pay | Admitting: Internal Medicine

## 2020-04-12 DIAGNOSIS — R29898 Other symptoms and signs involving the musculoskeletal system: Secondary | ICD-10-CM | POA: Diagnosis not present

## 2020-04-26 DIAGNOSIS — R29898 Other symptoms and signs involving the musculoskeletal system: Secondary | ICD-10-CM | POA: Diagnosis not present

## 2020-05-03 ENCOUNTER — Other Ambulatory Visit: Payer: Self-pay

## 2020-05-03 ENCOUNTER — Ambulatory Visit
Admission: RE | Admit: 2020-05-03 | Discharge: 2020-05-03 | Disposition: A | Discharge status: Home / Self Care | Payer: 59 | Source: Ambulatory Visit | Attending: Obstetrics and Gynecology | Admitting: Obstetrics and Gynecology

## 2020-05-03 DIAGNOSIS — Z1231 Encounter for screening mammogram for malignant neoplasm of breast: Secondary | ICD-10-CM | POA: Insufficient documentation

## 2020-05-08 ENCOUNTER — Other Ambulatory Visit: Payer: Self-pay | Admitting: Acute Care

## 2020-05-08 DIAGNOSIS — R29898 Other symptoms and signs involving the musculoskeletal system: Secondary | ICD-10-CM | POA: Diagnosis not present

## 2020-09-12 ENCOUNTER — Other Ambulatory Visit: Payer: Self-pay

## 2020-10-02 ENCOUNTER — Other Ambulatory Visit: Payer: Self-pay | Admitting: Internal Medicine

## 2020-10-03 ENCOUNTER — Other Ambulatory Visit: Payer: Self-pay | Admitting: Internal Medicine

## 2020-10-03 ENCOUNTER — Other Ambulatory Visit: Payer: Self-pay

## 2020-10-03 NOTE — Telephone Encounter (Signed)
RX Refill:ambien Last Seen:03-10-20 Last ordered:04-11-20  

## 2020-10-03 NOTE — Telephone Encounter (Signed)
RX Refill:ambien Last Seen:03-10-20 Last ordered:04-11-20

## 2020-10-04 ENCOUNTER — Other Ambulatory Visit: Payer: Self-pay

## 2020-10-04 MED FILL — Zolpidem Tartrate Tab 5 MG: ORAL | 30 days supply | Qty: 30 | Fill #0 | Status: AC

## 2020-10-04 NOTE — Telephone Encounter (Signed)
Refill for 30 days only.  OFFICE VISIT NEEDED prior to any more refills 

## 2020-10-25 ENCOUNTER — Other Ambulatory Visit: Payer: Self-pay

## 2020-10-25 ENCOUNTER — Ambulatory Visit (INDEPENDENT_AMBULATORY_CARE_PROVIDER_SITE_OTHER): Payer: No Typology Code available for payment source | Admitting: Internal Medicine

## 2020-10-25 VITALS — BP 132/80 | HR 71 | Temp 97.9°F | Resp 16 | Ht 66.0 in | Wt 265.0 lb

## 2020-10-25 DIAGNOSIS — I10 Essential (primary) hypertension: Secondary | ICD-10-CM

## 2020-10-25 DIAGNOSIS — R7303 Prediabetes: Secondary | ICD-10-CM | POA: Diagnosis not present

## 2020-10-25 DIAGNOSIS — H0520 Unspecified exophthalmos: Secondary | ICD-10-CM | POA: Diagnosis not present

## 2020-10-25 DIAGNOSIS — F5104 Psychophysiologic insomnia: Secondary | ICD-10-CM | POA: Diagnosis not present

## 2020-10-25 IMAGING — MG DIGITAL SCREENING BILATERAL MAMMOGRAM WITH TOMO AND CAD
8 of 15 series · 8 of 40 positions shown · non-contrast
Comparison: Previous exam(s).

CLINICAL DATA: Screening.

EXAM:
DIGITAL SCREENING BILATERAL MAMMOGRAM WITH TOMO AND CAD

[L MLO synth-2D]
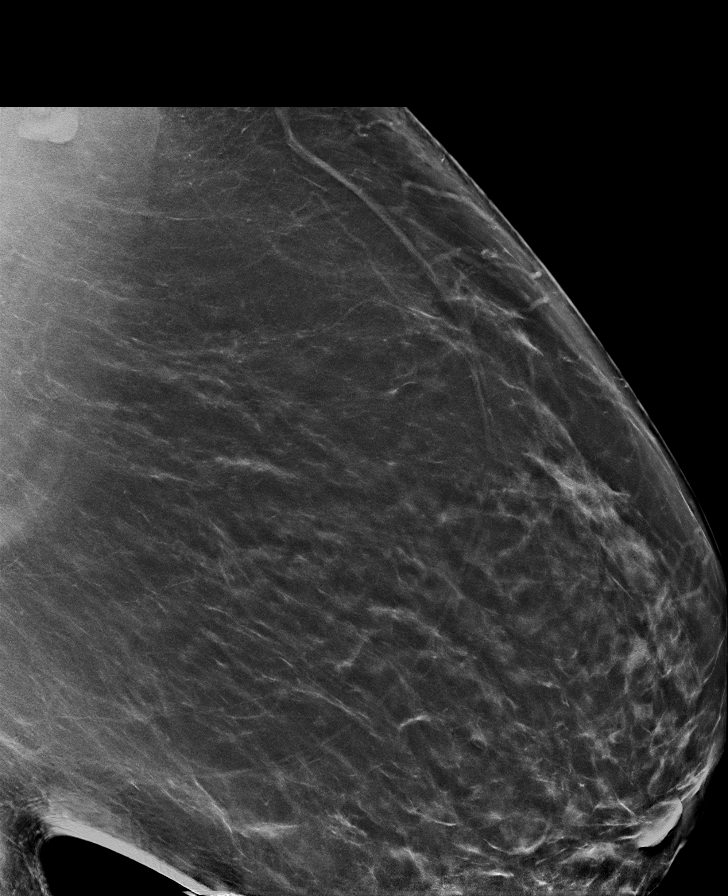

[R MLO synth-2D (1 of 2)]
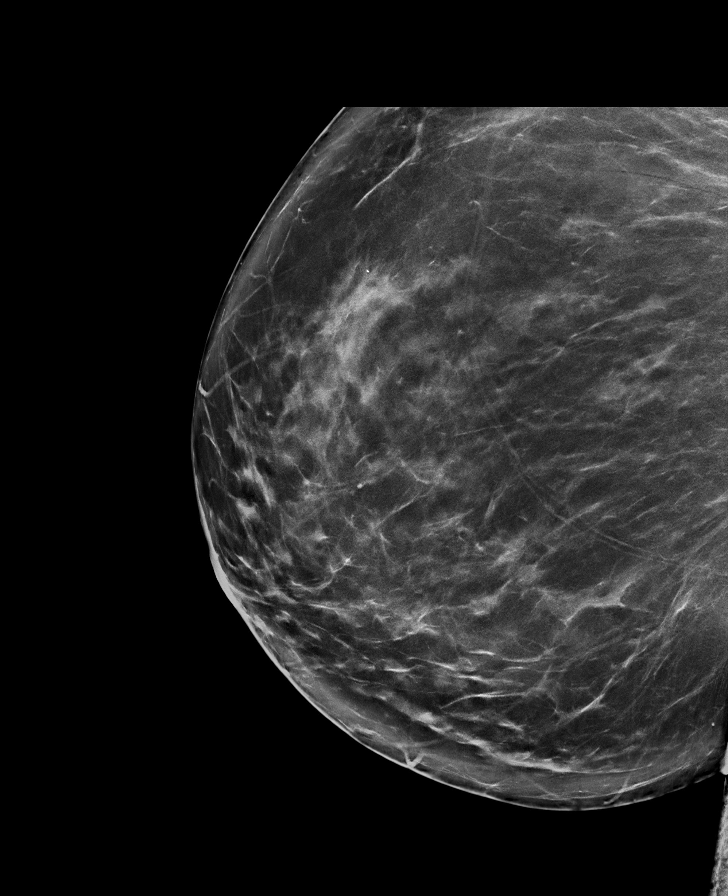

[L CC synth-2D (1 of 2)]
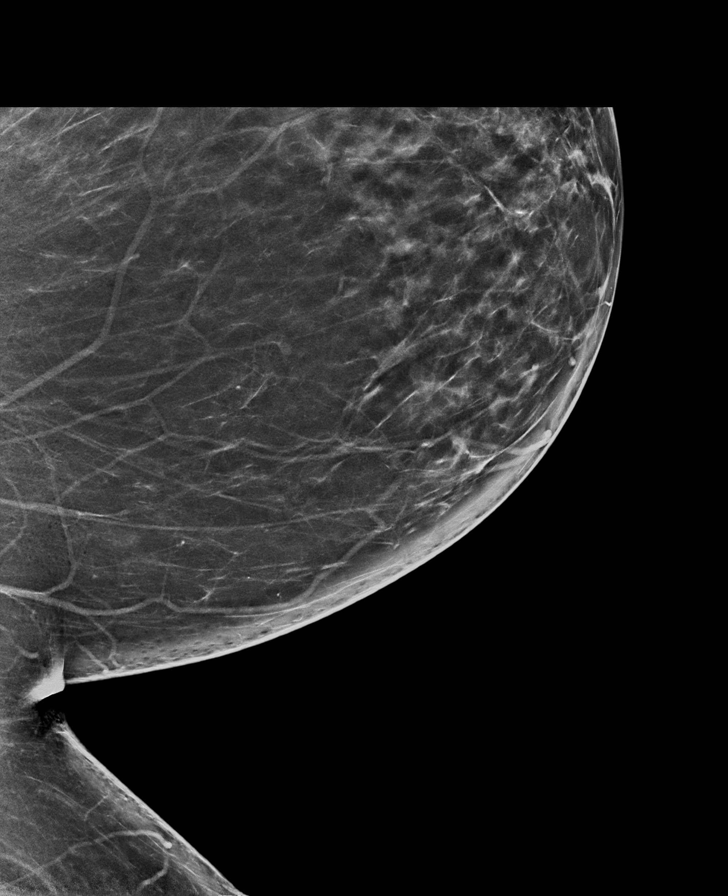

[L CC synth-2D (2 of 2)]
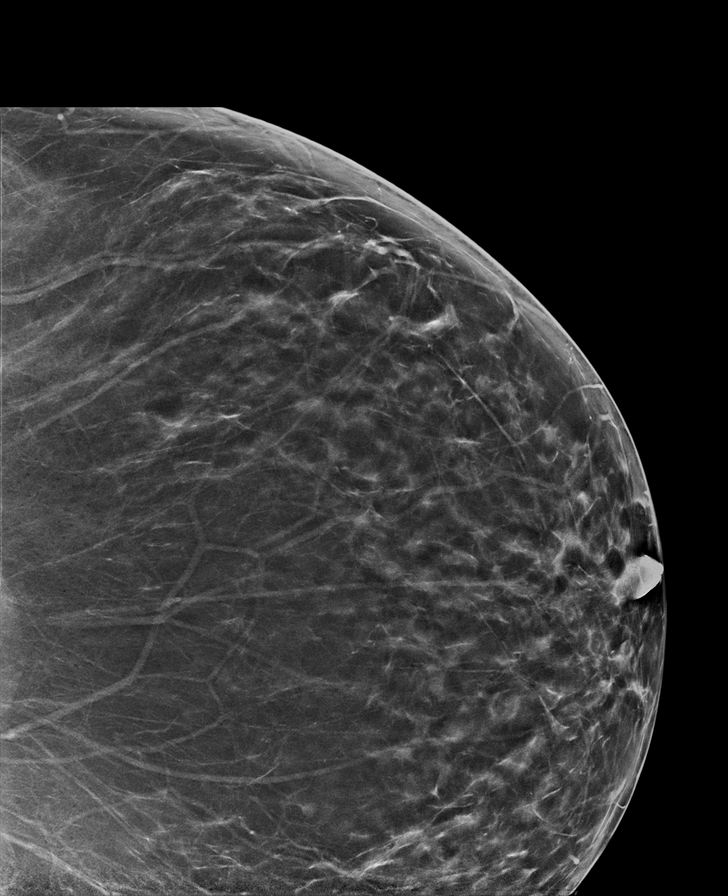

[R CC synth-2D (1 of 2)]
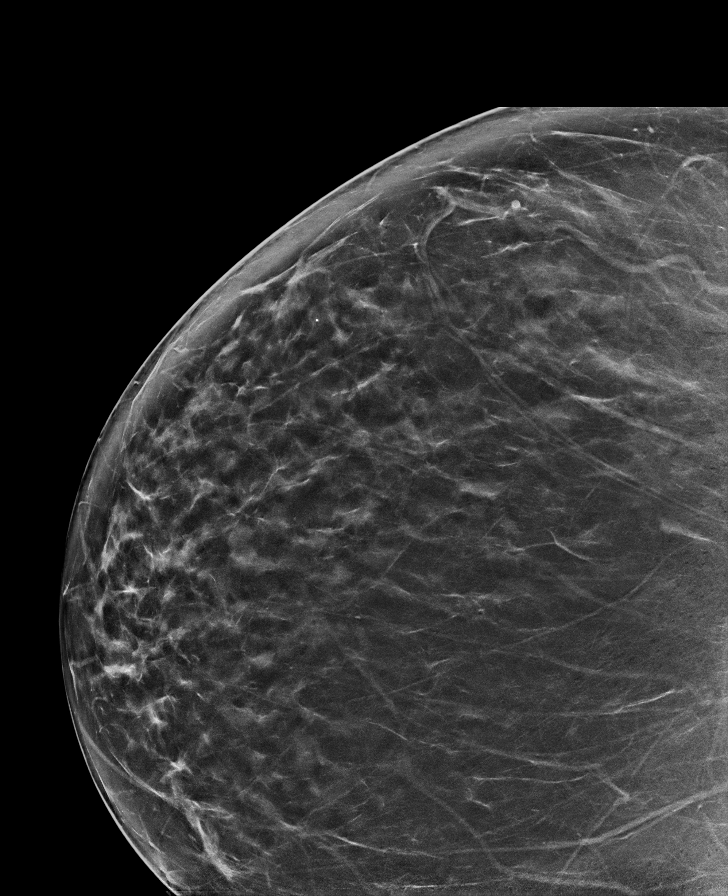

[R MLO synth-2D (2 of 2)]
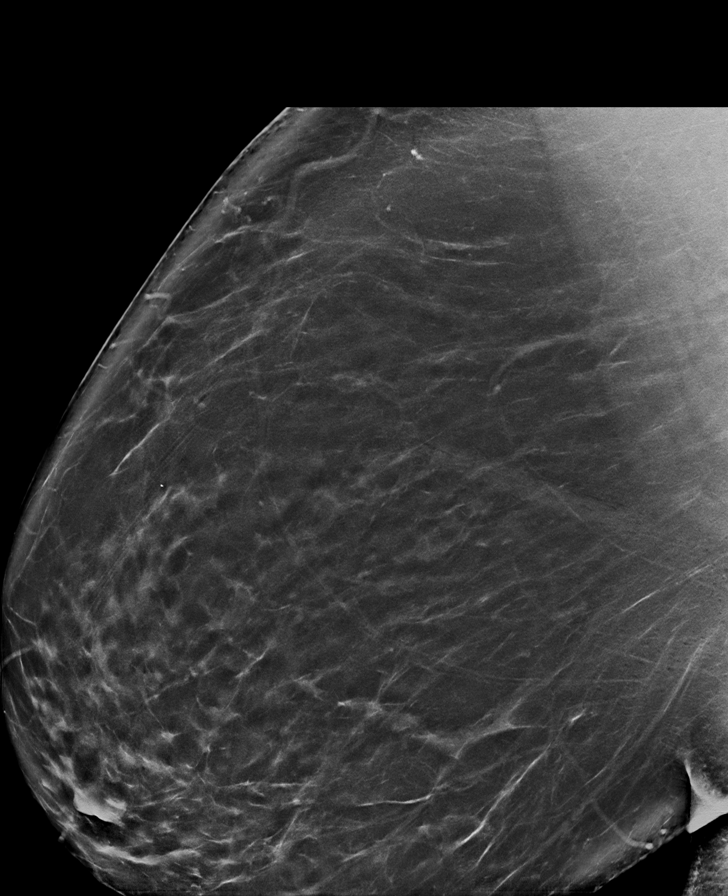

[R CC synth-2D (2 of 2)]
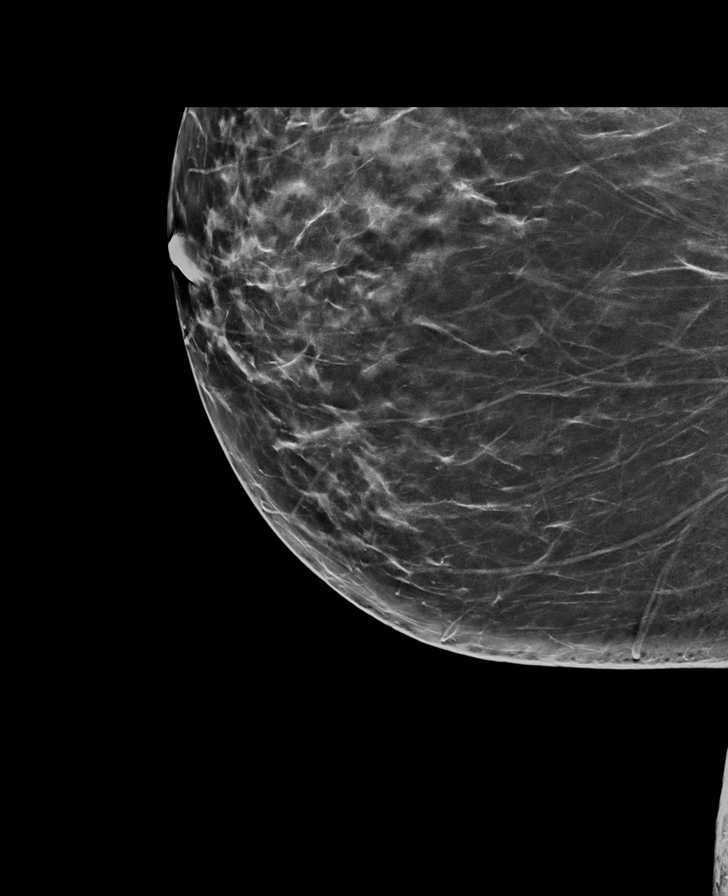

[R MLO tomo · tomo slice 72/105.0]
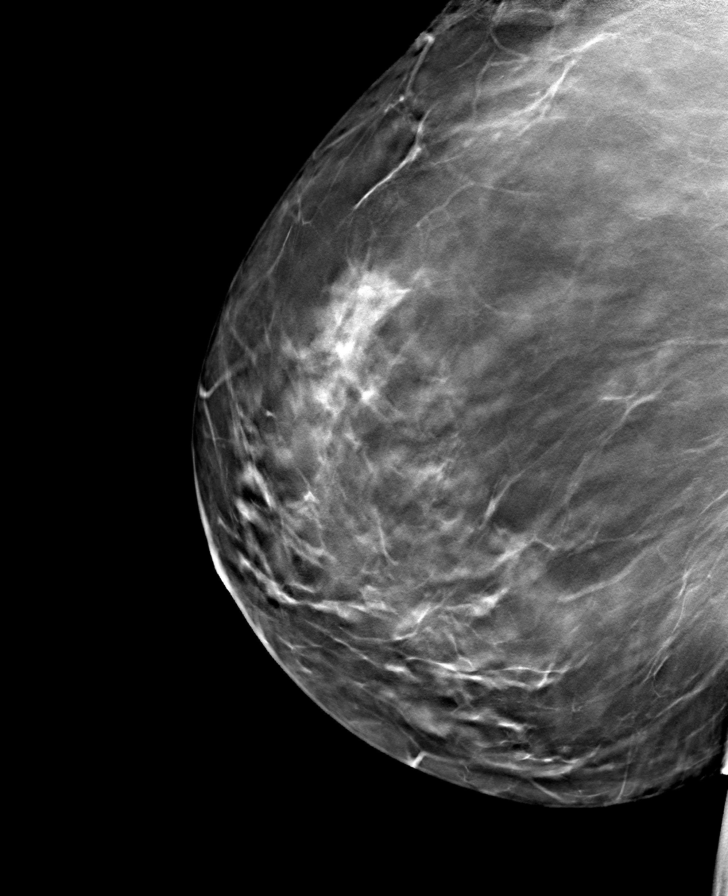

[8 of 40 positions shown; findings below may reference images not displayed]

ACR Breast Density Category b: There are scattered areas of
fibroglandular density.
FINDINGS: There are no findings suspicious for malignancy. Images were
processed with CAD.
IMPRESSION: No mammographic evidence of malignancy. A result letter of this
screening mammogram will be mailed directly to the patient.

RECOMMENDATION:
Screening mammogram in one year. (Code:CN-U-775)

BI-RADS CATEGORY  1: Negative.

## 2020-10-25 MED ORDER — LOSARTAN POTASSIUM-HCTZ 100-25 MG PO TABS
1.0000 | ORAL_TABLET | Freq: Every day | ORAL | 3 refills | Status: DC
  Filled 2020-10-25: qty 90, 90d supply, fill #0
  Filled 2021-04-02: qty 90, 90d supply, fill #1

## 2020-10-25 MED ORDER — ZOLPIDEM TARTRATE 5 MG PO TABS
5.0000 mg | ORAL_TABLET | Freq: Every evening | ORAL | 5 refills | Status: DC | PRN
Start: 2020-10-25 — End: 2021-05-09
  Filled 2020-10-25: qty 30, 30d supply, fill #0
  Filled 2020-12-05: qty 30, 30d supply, fill #1
  Filled 2021-01-07: qty 30, 30d supply, fill #2
  Filled 2021-02-04: qty 30, 30d supply, fill #3
  Filled 2021-03-06 – 2021-03-07 (×2): qty 30, 30d supply, fill #4
  Filled 2021-04-02 – 2021-04-04 (×2): qty 30, 30d supply, fill #5

## 2020-10-25 MED ORDER — METOPROLOL SUCCINATE ER 50 MG PO TB24
ORAL_TABLET | Freq: Every day | ORAL | 3 refills | Status: DC
  Filled 2020-10-25: qty 90, 90d supply, fill #0
  Filled 2021-04-02: qty 90, 90d supply, fill #1
  Filled 2021-08-04: qty 90, 90d supply, fill #2

## 2020-10-25 NOTE — Patient Instructions (Signed)
For your blood pressure  Losartan/hctz 100/25 once daily in the morning  Metoprolol at night  I recommend taking melatonin for your insomnia.  It is not a sedative,  But must be taken on  a regular basis to help your internal clock.  Take every evening after dinner start with 5 mg dose   Max effective dose is 10 mg

## 2020-10-25 NOTE — Progress Notes (Signed)
Subjective:  Patient ID: [name], female    DOB: Oct 09, 1970  Age: 50 y.o. MRN: [id number]  CC: The primary encounter diagnosis was Exophthalmos of both eyes. Diagnoses of Essential hypertension, Prediabetes, Psychophysiological insomnia, and Morbid obesity ([place]) were also pertinent to this visit.  HPI RHYLAN GROSS presents for follow up on cervical radiculopathy (right sided),   hypertension, obesity and insomnia managed with ambien.  Last seen oct 2021   This visit occurred during the SARS-CoV-2 public health emergency.  Safety protocols were in place, including screening questions prior to the visit, additional usage of staff PPE, and extensive cleaning of exam room while observing appropriate contact time as indicated for disinfecting solutions.   Hypertension: patient checks blood pressure twice weekly at home or at work .  Readings have been for the most part > 140/80 at rest . Patient is following a reduce salt diet most days and is taking medications as prescribed (hctz 25 mg daily and losartan 100 mg daily)   Insomnia:  Has been somewhat harder to manage lately due to increased anxiety . Her daughter Sherrine Maples has done incredibly well during her freshman year of college,  And has been trying to secure a course during the summer,  But the cost of the course is currently prohibitive and keeping her from matriculating.  [employer] is struggling to find a way to pay for the class .   Obesity;  She has struggled with her weight since Mallory went away to college.    Outpatient Medications Prior to Visit  Medication Sig Dispense Refill  . amLODipine (NORVASC) 5 MG tablet TAKE 1&1/2 TABLETS (7.5 MG TOTAL) BY MOUTH DAILY. 135 tablet 3  . gabapentin (NEURONTIN) 300 MG capsule TAKE 1 CAPSULE (300 MG TOTAL) BY MOUTH NIGHTLY 90 capsule 1  . metFORMIN (GLUCOPHAGE) 500 MG tablet TAKE 1 TABLET BY MOUTH DAILY WITH BREAKFAST. 90 tablet 3  . zolpidem (AMBIEN) 5 MG tablet Take 1 tablet (5 mg  total) by mouth at bedtime as needed. 30 tablet 0  . gabapentin (NEURONTIN) 100 MG capsule TAKE 1 TO 3 CAPSULES BY MOUTH DAILY BEFORE BEDTIME 90 capsule 3  . ibuprofen (ADVIL) 800 MG tablet Take 1 tablet (800 mg total) by mouth 3 (three) times daily. 90 tablet 1  . losartan-hydrochlorothiazide (HYZAAR) 100-25 MG tablet Take 1 tablet by mouth daily. (Patient not taking: Reported on 10/25/2020) 90 tablet 3  . metoprolol succinate (TOPROL-XL) 50 MG 24 hr tablet TAKE 1 TABLET BY MOUTH DAILY. TAKE WITH OR IMMEDIATELY FOLLOWING A MEAL. 90 tablet 3   Facility-Administered Medications Prior to Visit  Medication Dose Route Frequency Provider Last Rate Last Admin  . triamcinolone acetonide (KENALOG) 10 MG/ML injection 10 mg  10 mg Other Once [place], Titorya, DPM      . triamcinolone acetonide (KENALOG-40) injection 20 mg  20 mg Other Once Landis Martins, DPM        Review of Systems;  Patient denies headache, fevers, malaise, unintentional weight loss, skin rash, eye pain, sinus congestion and sinus pain, sore throat, dysphagia,  hemoptysis , cough, dyspnea, wheezing, chest pain, palpitations, orthopnea, edema, abdominal pain, nausea, melena, diarrhea, constipation, flank pain, dysuria, hematuria, urinary  Frequency, nocturia, numbness, tingling, seizures,  Focal weakness, Loss of consciousness,  Tremor, insomnia, depression, anxiety, and suicidal ideation.      Objective:  BP 132/80   Pulse 71   Temp 97.9 F (36.6 C)   Resp 16   Ht 5\' 6"  (1.676 m)  Wt 265 lb (120.2 kg)   SpO2 98%   BMI 42.77 kg/m   BP Readings from Last 3 Encounters:  10/25/20 132/80  03/10/20 118/82  02/02/20 111/74    Wt Readings from Last 3 Encounters:  10/25/20 265 lb (120.2 kg)  03/10/20 263 lb 12.8 oz (119.7 kg)  02/02/20 264 lb (119.7 kg)    General appearance: alert, cooperative and appears stated age Ears: normal TM's and external ear canals both ears Throat: lips, mucosa, and tongue normal; teeth and  gums normal Neck: no adenopathy, no carotid bruit, supple, symmetrical, trachea midline and thyroid not enlarged, symmetric, no tenderness/mass/nodules Back: symmetric, no curvature. ROM normal. No CVA tenderness. Lungs: clear to auscultation bilaterally Heart: regular rate and rhythm, S1, S2 normal, no murmur, click, rub or gallop Abdomen: soft, non-tender; bowel sounds normal; no masses,  no organomegaly Pulses: 2+ and symmetric Skin: Skin color, texture, turgor normal. No rashes or lesions Lymph nodes: Cervical, supraclavicular, and axillary nodes normal.  Lab Results  Component Value Date   HGBA1C 5.5 10/25/2020   HGBA1C 5.7 03/13/2020   HGBA1C 5.7 01/18/2019    Lab Results  Component Value Date   CREATININE 1.02 10/25/2020   CREATININE 0.68 03/13/2020   CREATININE 0.75 09/24/2018    Lab Results  Component Value Date   WBC 4.9 01/18/2019   HGB 12.0 01/18/2019   HCT 36.7 01/18/2019   PLT 376.0 01/18/2019   GLUCOSE 111 (H) 10/25/2020   CHOL 110 03/13/2020   TRIG 61.0 03/13/2020   HDL 40.00 03/13/2020   LDLDIRECT 54.0 08/07/2015   LDLCALC 58 03/13/2020   ALT 19 10/25/2020   AST 20 10/25/2020   NA 139 10/25/2020   K 3.8 10/25/2020   CL 101 10/25/2020   CREATININE 1.02 10/25/2020   BUN 14 10/25/2020   CO2 27 10/25/2020   TSH 0.97 10/25/2020   HGBA1C 5.5 10/25/2020   MICROALBUR 5.4 (H) 03/19/2017    MM 3D SCREEN BREAST BILATERAL  Result Date: 05/07/2020 CLINICAL DATA:  Screening. EXAM: DIGITAL SCREENING BILATERAL MAMMOGRAM WITH TOMO AND CAD COMPARISON:  None. ACR Breast Density Category b: There are scattered areas of fibroglandular density. FINDINGS: There are no findings suspicious for malignancy. Images were processed with CAD. IMPRESSION: No mammographic evidence of malignancy. A result letter of this screening mammogram will be mailed directly to the patient. RECOMMENDATION: Screening mammogram in one year. (Code:SM-B-01Y) BI-RADS CATEGORY  1: Negative.  Electronically Signed   By: Marin Olp M.D.   On: 05/07/2020 10:53    Assessment & Plan:   Problem List Items Addressed This Visit      Unprioritized   Essential hypertension    Well controlled on current regimen of losartan 100 mg , hctz 25 mg, and amlodipine 7.5 mg daily   Renal function stable, no changes today.  Lab Results  Component Value Date   CREATININE 1.02 10/25/2020   Lab Results  Component Value Date   NA 139 10/25/2020   K 3.8 10/25/2020   CL 101 10/25/2020   CO2 27 10/25/2020         Relevant Medications   losartan-hydrochlorothiazide (HYZAAR) 100-25 MG tablet   metoprolol succinate (TOPROL-XL) 50 MG 24 hr tablet   Other Relevant Orders   Comprehensive metabolic panel (Completed)   Insomnia    managed with ambien prn. The risks and benefits of hypnotics were reviewed with patient today including excessive sedation leading to respiratory depression,  impaired thinking/driving, and addiction.  Patient was advised to  avoid concurrent use with alcohol, to use medication only as needed and not to share with others  .           Morbid obesity ([place])    Strongly urged to resume walking program and continued efforts at portion reduction .  Continue metformin for appetite suppression       Other Visit Diagnoses    Exophthalmos of both eyes    -  Primary   Relevant Orders   TSH (Completed)   Prediabetes       Relevant Orders   Hemoglobin A1c (Completed)      I provided  20 minutes of  face-to-face time during this encounter r eviewing patient's current problems  labs and imaging studies, providing counseling on  Her anxiety , obesity and insomnia ,  and coordination  of care . I am having Kylar L. Cooperman maintain her ibuprofen, gabapentin, gabapentin, amLODipine, metFORMIN, losartan-hydrochlorothiazide, metoprolol succinate, and zolpidem. We will continue to administer triamcinolone acetonide and triamcinolone acetonide.  Meds ordered this encounter   Medications  . losartan-hydrochlorothiazide (HYZAAR) 100-25 MG tablet    Sig: Take 1 tablet by mouth daily.    Dispense:  90 tablet    Refill:  3  . metoprolol succinate (TOPROL-XL) 50 MG 24 hr tablet    Sig: TAKE 1 TABLET BY MOUTH DAILY. TAKE WITH OR IMMEDIATELY FOLLOWING A MEAL.    Dispense:  90 tablet    Refill:  3  . zolpidem (AMBIEN) 5 MG tablet    Sig: Take 1 tablet (5 mg total) by mouth at bedtime as needed.    Dispense:  30 tablet    Refill:  5    Medications Discontinued During This Encounter  Medication Reason  . losartan-hydrochlorothiazide (HYZAAR) 100-25 MG tablet Reorder  . metoprolol succinate (TOPROL-XL) 50 MG 24 hr tablet Reorder  . zolpidem (AMBIEN) 5 MG tablet Reorder    Follow-up: No follow-ups on file.   [name], MD

## 2020-10-26 ENCOUNTER — Other Ambulatory Visit: Payer: Self-pay

## 2020-10-26 LAB — COMPREHENSIVE METABOLIC PANEL
ALT: 19 U/L (ref 0–35)
AST: 20 U/L (ref 0–37)
Albumin: 4.6 g/dL (ref 3.5–5.2)
Alkaline Phosphatase: 57 U/L (ref 39–117)
BUN: 14 mg/dL (ref 6–23)
CO2: 27 mEq/L (ref 19–32)
Calcium: 9.8 mg/dL (ref 8.4–10.5)
Chloride: 101 mEq/L (ref 96–112)
Creatinine, Ser: 1.02 mg/dL (ref 0.40–1.20)
GFR: 65.01 mL/min (ref >60.00)
Glucose, Bld: 111 mg/dL — ABNORMAL HIGH (ref 70–99)
Potassium: 3.8 mEq/L (ref 3.5–5.1)
Sodium: 139 mEq/L (ref 135–145)
Total Bilirubin: 0.6 mg/dL (ref 0.2–1.2)
Total Protein: 7.6 g/dL (ref 6.0–8.3)

## 2020-10-26 LAB — HEMOGLOBIN A1C: Hgb A1c MFr Bld: 5.5 % (ref 4.6–6.5)

## 2020-10-26 LAB — TSH: TSH: 0.97 u[IU]/mL (ref 0.35–4.50)

## 2020-10-27 ENCOUNTER — Other Ambulatory Visit: Payer: Self-pay

## 2020-10-28 ENCOUNTER — Encounter: Payer: Self-pay | Admitting: Internal Medicine

## 2020-10-28 NOTE — Assessment & Plan Note (Signed)
managed with ambien prn. The risks and benefits of hypnotics were reviewed with patient today including excessive sedation leading to respiratory depression,  impaired thinking/driving, and addiction.  Patient was advised to avoid concurrent use with alcohol, to use medication only as needed and not to share with others  .      

## 2020-10-28 NOTE — Assessment & Plan Note (Addendum)
Strongly urged to resume walking program and continued efforts at portion reduction .  Continue metformin for appetite suppression

## 2020-10-28 NOTE — Assessment & Plan Note (Addendum)
Well controlled on current regimen of losartan 100 mg , hctz 25 mg, and amlodipine 7.5 mg daily   Renal function stable, no changes today.  Lab Results  Component Value Date   CREATININE 1.02 10/25/2020   Lab Results  Component Value Date   NA 139 10/25/2020   K 3.8 10/25/2020   CL 101 10/25/2020   CO2 27 10/25/2020

## 2020-10-29 NOTE — Progress Notes (Signed)
Your non fasting labs were normal and your a1c is NOT prediabetic.  Regards,   Deborra Medina, MD

## 2020-10-31 ENCOUNTER — Other Ambulatory Visit: Payer: Self-pay

## 2020-11-07 ENCOUNTER — Other Ambulatory Visit: Payer: Self-pay

## 2020-11-07 ENCOUNTER — Telehealth (INDEPENDENT_AMBULATORY_CARE_PROVIDER_SITE_OTHER): Payer: No Typology Code available for payment source | Admitting: Internal Medicine

## 2020-11-07 ENCOUNTER — Encounter: Payer: Self-pay | Admitting: Internal Medicine

## 2020-11-07 VITALS — Ht 66.0 in | Wt 265.0 lb

## 2020-11-07 DIAGNOSIS — R253 Fasciculation: Secondary | ICD-10-CM | POA: Diagnosis not present

## 2020-11-07 DIAGNOSIS — H0520 Unspecified exophthalmos: Secondary | ICD-10-CM

## 2020-11-07 DIAGNOSIS — H04123 Dry eye syndrome of bilateral lacrimal glands: Secondary | ICD-10-CM | POA: Diagnosis not present

## 2020-11-07 DIAGNOSIS — Z0100 Encounter for examination of eyes and vision without abnormal findings: Secondary | ICD-10-CM

## 2020-11-07 NOTE — Progress Notes (Signed)
Twitching in the left eye, starting 2 months ago. No known injuries.  No history of eye problems, no glasses or contacts.  Random eye twitching, no pain.

## 2020-11-07 NOTE — Progress Notes (Signed)
Telephone Note  I connected with Melanie Chavez   on 11/07/20 at  415PM EDT by a telephone and verified that I am speaking with the correct person using two identifiers.  Location patient: home, [place] Location provider:work or home office Persons participating in the virtual visit: patient, provider  I discussed the limitations of evaluation and management by telemedicine and the availability of in person appointments. The patient expressed understanding and agreed to proceed.   HPI:  Acute telemedicine visit for : -left upper eyelid twitching x 2 months daily no injury she has large eyes and at night they dont close all of the way at night and in the am trouble getting eyelids closed. Eyes are dry after a nights rest. Tried warm compress vision sleeping 7-8 hours at night. 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  Past Medical History:  Diagnosis Date  . H/O pelvic inflammatory disease   . History of recurrent miscarriages, not currently pregnant   . Hypertension   . Obesity (BMI 30-39.9)   . Plantar fasciitis   . Reactive hypoglycemia 2011    Past Surgical History:  Procedure Laterality Date  . CHOLECYSTECTOMY  2007   [place]  . FOOT SURGERY  11/04/2019     Current Outpatient Medications:  .  amLODipine (NORVASC) 5 MG tablet, TAKE 1&1/2 TABLETS (7.5 MG TOTAL) BY MOUTH DAILY., Disp: 135 tablet, Rfl: 3 .  gabapentin (NEURONTIN) 300 MG capsule, TAKE 1 CAPSULE (300 MG TOTAL) BY MOUTH NIGHTLY, Disp: 90 capsule, Rfl: 1 .  metFORMIN (GLUCOPHAGE) 500 MG tablet, TAKE 1 TABLET BY MOUTH DAILY WITH BREAKFAST., Disp: 90 tablet, Rfl: 3 .  metoprolol succinate (TOPROL-XL) 50 MG 24 hr tablet, TAKE 1 TABLET BY MOUTH DAILY. TAKE WITH OR IMMEDIATELY FOLLOWING A MEAL., Disp: 90 tablet, Rfl: 3 .  zolpidem (AMBIEN) 5 MG tablet, Take 1 tablet (5 mg total) by mouth at bedtime  as needed., Disp: 30 tablet, Rfl: 5 .  gabapentin (NEURONTIN) 100 MG capsule, TAKE 1 TO 3 CAPSULES BY MOUTH DAILY BEFORE BEDTIME, Disp: 90 capsule, Rfl: 3 .  ibuprofen (ADVIL) 800 MG tablet, Take 1 tablet (800 mg total) by mouth 3 (three) times daily., Disp: 90 tablet, Rfl: 1 .  losartan-hydrochlorothiazide (HYZAAR) 100-25 MG tablet, Take 1 tablet by mouth daily. (Patient not taking: Reported on 11/07/2020), Disp: 90 tablet, Rfl: 3  Current Facility-Administered Medications:  .  triamcinolone acetonide (KENALOG) 10 MG/ML injection 10 mg, 10 mg, Other, Once, [place], Titorya, DPM .  triamcinolone acetonide (KENALOG-40) injection 20 mg, 20 mg, Other, Once, [place], [place], DPM  EXAM:  VITALS per patient if applicable:  GENERAL: alert, oriented, appears well and in no acute distress  PSYCH/NEURO: pleasant and cooperative, no obvious depression or anxiety, speech and thought processing grossly intact  ASSESSMENT AND PLAN:  Discussed the following assessment and plan:  Eyelid twitch - Plan: Ambulatory referral to Ophthalmology Dry eye syndrome of both eyes - Plan: Ambulatory referral to Ophthalmology Exophthalmos - Plan: Ambulatory referral to Ophthalmology Routine eye exam -consider [facility] neurology in the future   -we discussed possible serious and likely etiologies, options for evaluation and workup, limitations of telemedicine visit vs in person visit, treatment, treatment risks and precautions. Pt prefers to treat via telemedicine empirically rather than in person at this moment.  Advised to schedule follow up visit with PCP or [facility]  if any further questions or concerns to avoid delays in care.   I discussed the assessment and treatment plan with the patient. The patient was provided an opportunity to ask questions and all were answered. The patient agreed with the plan and demonstrated an understanding of the instructions.    Time spent 10 min Delorise Jackson, MD

## 2020-11-15 ENCOUNTER — Telehealth: Payer: Self-pay | Admitting: Internal Medicine

## 2020-11-15 NOTE — Telephone Encounter (Signed)
Rejection Reason - Patient did not respond" Catilin Rehm said on Nov 14, 2020 1:18 PM  "LM" Neta Ehlers said on Nov 08, 2020 1:09 PM  msg from [facility]

## 2020-11-19 ENCOUNTER — Other Ambulatory Visit: Payer: Self-pay

## 2020-11-20 ENCOUNTER — Other Ambulatory Visit: Payer: Self-pay

## 2020-11-21 ENCOUNTER — Other Ambulatory Visit: Payer: Self-pay

## 2020-11-22 ENCOUNTER — Other Ambulatory Visit: Payer: Self-pay

## 2020-11-23 ENCOUNTER — Other Ambulatory Visit: Payer: Self-pay

## 2020-12-06 ENCOUNTER — Other Ambulatory Visit: Payer: Self-pay

## 2020-12-17 ENCOUNTER — Other Ambulatory Visit: Payer: Self-pay

## 2020-12-19 ENCOUNTER — Other Ambulatory Visit: Payer: Self-pay

## 2020-12-22 ENCOUNTER — Other Ambulatory Visit: Payer: Self-pay

## 2021-01-08 ENCOUNTER — Other Ambulatory Visit: Payer: Self-pay

## 2021-01-11 ENCOUNTER — Other Ambulatory Visit: Payer: Self-pay

## 2021-01-30 ENCOUNTER — Other Ambulatory Visit: Payer: Self-pay | Admitting: Internal Medicine

## 2021-01-30 ENCOUNTER — Other Ambulatory Visit: Payer: Self-pay

## 2021-01-30 MED ORDER — AMLODIPINE BESYLATE 5 MG PO TABS
ORAL_TABLET | ORAL | 3 refills | Status: DC
  Filled 2021-01-30: qty 135, 90d supply, fill #0
  Filled 2021-08-04: qty 135, 90d supply, fill #1
  Filled 2021-12-17: qty 135, 90d supply, fill #2

## 2021-02-05 ENCOUNTER — Other Ambulatory Visit: Payer: Self-pay

## 2021-02-07 ENCOUNTER — Other Ambulatory Visit: Payer: Self-pay

## 2021-02-07 ENCOUNTER — Encounter: Payer: Self-pay | Admitting: Obstetrics and Gynecology

## 2021-02-07 ENCOUNTER — Ambulatory Visit (INDEPENDENT_AMBULATORY_CARE_PROVIDER_SITE_OTHER): Payer: Self-pay | Admitting: Obstetrics and Gynecology

## 2021-02-07 VITALS — BP 126/85 | HR 60 | Resp 16 | Ht 66.0 in | Wt 260.6 lb

## 2021-02-07 DIAGNOSIS — Z01419 Encounter for gynecological examination (general) (routine) without abnormal findings: Secondary | ICD-10-CM

## 2021-02-07 DIAGNOSIS — Z6841 Body Mass Index (BMI) 40.0 and over, adult: Secondary | ICD-10-CM

## 2021-02-07 NOTE — Progress Notes (Signed)
HPI:      [name] is a 50 y.o. No obstetric history on file. who LMP was Patient's last menstrual period was 02/07/2021 (exact date).  Subjective:   She presents today for her annual examination.  She says she is doing well.  She occasionally has some spotting with her IUD but is overall very pleased with it.  She is up-to-date on mammography and Pap smears.    Hx: The following portions of the patient's history were reviewed and updated as appropriate:             She  has a past medical history of H/O pelvic inflammatory disease, History of recurrent miscarriages, not currently pregnant, Hypertension, Obesity (BMI 30-39.9), Plantar fasciitis, and Reactive hypoglycemia (2011). She does not have any pertinent problems on file. She  has a past surgical history that includes Cholecystectomy (2007) and Foot surgery (11/04/2019). Her family history includes Birth defects in her paternal uncle; Cancer in her maternal grandmother; Diabetes in her mother; Heart disease in her father, paternal aunt, paternal grandfather, and paternal uncle. She  reports that she has never smoked. She has never used smokeless tobacco. No history on file for alcohol use and drug use. She has a current medication list which includes the following prescription(s): amlodipine, losartan-hydrochlorothiazide, metoprolol succinate, zolpidem, gabapentin, metformin, [DISCONTINUED] hydrochlorothiazide, and [DISCONTINUED] losartan, and the following Facility-Administered Medications: triamcinolone acetonide and triamcinolone acetonide. She is allergic to oyster extract.       Review of Systems:  Review of Systems  Constitutional: Denied constitutional symptoms, night sweats, recent illness, fatigue, fever, insomnia and weight loss.  Eyes: Denied eye symptoms, eye pain, photophobia, vision change and visual disturbance.  Ears/Nose/Throat/Neck: Denied ear, nose, throat or neck symptoms, hearing loss, nasal discharge,  sinus congestion and sore throat.  Cardiovascular: Denied cardiovascular symptoms, arrhythmia, chest pain/pressure, edema, exercise intolerance, orthopnea and palpitations.  Respiratory: Denied pulmonary symptoms, asthma, pleuritic pain, productive sputum, cough, dyspnea and wheezing.  Gastrointestinal: Denied, gastro-esophageal reflux, melena, nausea and vomiting.  Genitourinary: Denied genitourinary symptoms including symptomatic vaginal discharge, pelvic relaxation issues, and urinary complaints.  Musculoskeletal: Denied musculoskeletal symptoms, stiffness, swelling, muscle weakness and myalgia.  Dermatologic: Denied dermatology symptoms, rash and scar.  Neurologic: Denied neurology symptoms, dizziness, headache, neck pain and syncope.  Psychiatric: Denied psychiatric symptoms, anxiety and depression.  Endocrine: Denied endocrine symptoms including hot flashes and night sweats.   Meds:   Current Outpatient Medications on File Prior to Visit  Medication Sig Dispense Refill   amLODipine (NORVASC) 5 MG tablet TAKE 1&1/2 TABLETS (7.5 MG TOTAL) BY MOUTH DAILY. 135 tablet 3   losartan-hydrochlorothiazide (HYZAAR) 100-25 MG tablet Take 1 tablet by mouth daily. 90 tablet 3   metoprolol succinate (TOPROL-XL) 50 MG 24 hr tablet TAKE 1 TABLET BY MOUTH DAILY. TAKE WITH OR IMMEDIATELY FOLLOWING A MEAL. 90 tablet 3   zolpidem (AMBIEN) 5 MG tablet Take 1 tablet (5 mg total) by mouth at bedtime as needed. 30 tablet 5   gabapentin (NEURONTIN) 300 MG capsule TAKE 1 CAPSULE (300 MG TOTAL) BY MOUTH NIGHTLY (Patient not taking: Reported on 02/07/2021) 90 capsule 1   metFORMIN (GLUCOPHAGE) 500 MG tablet TAKE 1 TABLET BY MOUTH DAILY WITH BREAKFAST. 90 tablet 3   [DISCONTINUED] hydrochlorothiazide (MICROZIDE) 12.5 MG capsule Take 2 capsules (25 mg total) by mouth daily. 90 capsule 3   [DISCONTINUED] losartan (COZAAR) 100 MG tablet TAKE 1 TABLET BY MOUTH ONCE DAILY WITH HCTZ 90 tablet 3   Current  Facility-Administered Medications  on File Prior to Visit  Medication Dose Route Frequency Provider Last Rate Last Admin   triamcinolone acetonide (KENALOG) 10 MG/ML injection 10 mg  10 mg Other Once Landis Martins, DPM       triamcinolone acetonide (KENALOG-40) injection 20 mg  20 mg Other Once Landis Martins, DPM           Objective:     Vitals:   02/07/21 0805  BP: 126/85  Pulse: 60  Resp: 16    Filed Weights   02/07/21 0805  Weight: 260 lb 9.6 oz (118.2 kg)              Physical examination General NAD, Conversant  HEENT Atraumatic; Op clear with mmm.  Normo-cephalic. Pupils reactive. Anicteric sclerae  Thyroid/Neck Smooth without nodularity or enlargement. Normal ROM.  Neck Supple.  Skin No rashes, lesions or ulceration. Normal palpated skin turgor. No nodularity.  Breasts: No masses or discharge.  Symmetric.  No axillary adenopathy.  Lungs: Clear to auscultation.No rales or wheezes. Normal Respiratory effort, no retractions.  Heart: NSR.  No murmurs or rubs appreciated. No periferal edema  Abdomen: Soft.  Non-tender.  No masses.  No HSM. No hernia  Extremities: Moves all appropriately.  Normal ROM for age. No lymphadenopathy.  Neuro: Oriented to PPT.  Normal mood. Normal affect.     Pelvic:   Vulva: Normal appearance.  No lesions.  Vagina: No lesions or abnormalities noted.  Support: Normal pelvic support.  Urethra No masses tenderness or scarring.  Meatus Normal size without lesions or prolapse.  Cervix: Normal appearance.  No lesions.  IUD strings noted  Anus: Normal exam.  No lesions.  Perineum: Normal exam.  No lesions.        Bimanual   Uterus: Normal size.  Non-tender.  Mobile.  AV.  Adnexae: No masses.  Non-tender to palpation.  Cul-de-sac: Negative for abnormality.   Overall exam limited by patient body habitus.  Assessment:    No obstetric history on file. Patient Active Problem List   Diagnosis Date Noted   Dry eye syndrome of both eyes  11/07/2020   Weakness of right hand 03/10/2020   Ganglion cyst of foot 01/24/2019   [name] 12/31/2018   Morbid obesity ([place]) 12/31/2018   Sciatica without back pain 12/09/2018   Educated about COVID-19 virus infection 12/09/2018   Insomnia 07/22/2014   Snoring 05/03/2013   Visit for preventive health examination 10/17/2011   H/O pelvic inflammatory disease    Reactive hypoglycemia    History of recurrent miscarriages, not currently pregnant    Essential hypertension 07/17/2011     1. Well woman exam with routine gynecological exam   2. Morbid obesity with BMI of 40.0-44.9, adult [facility])     Patient doing well with IUD-normal exam   Plan:            1.  Basic Screening Recommendations The basic screening recommendations for asymptomatic women were discussed with the patient during her visit.  The age-appropriate recommendations were discussed with her and the rational for the tests reviewed.  When I am informed by the patient that another primary care physician has previously obtained the age-appropriate tests and they are up-to-date, only outstanding tests are ordered and referrals given as necessary.  Abnormal results of tests will be discussed with her when all of her results are completed.  Routine preventative health maintenance measures emphasized: Exercise/Diet/Weight control, Tobacco Warnings, Alcohol/Substance use risks and Stress Management Mammogram ordered -lab work today as patient  is fasting. Orders Orders Placed This Encounter  Procedures   MM DIGITAL SCREENING BILATERAL   CBC   Basic metabolic panel   Hemoglobin A1c   Lipid panel   TSH   Cologuard    No orders of the defined types were placed in this encounter.         F/U  Return in about 1 year (around 02/07/2022) for Annual Physical.  Finis Bud, M.D. 02/07/2021 8:41 AM

## 2021-02-08 LAB — BASIC METABOLIC PANEL
BUN/Creatinine Ratio: 14 (ref 9–23)
BUN: 11 mg/dL (ref 6–24)
CO2: 22 mmol/L (ref 20–29)
Calcium: 9.3 mg/dL (ref 8.7–10.2)
Chloride: 100 mmol/L (ref 96–106)
Creatinine, Ser: 0.81 mg/dL (ref 0.57–1.00)
Glucose: 76 mg/dL (ref 65–99)
Potassium: 4.2 mmol/L (ref 3.5–5.2)
Sodium: 140 mmol/L (ref 134–144)
eGFR: 89 mL/min/{1.73_m2} (ref >59)

## 2021-02-08 LAB — LIPID PANEL
Chol/HDL Ratio: 2.6 ratio (ref 0.0–4.4)
Cholesterol, Total: 120 mg/dL (ref 100–199)
HDL: 46 mg/dL (ref >39)
LDL Chol Calc (NIH): 62 mg/dL (ref 0–99)
Triglycerides: 52 mg/dL (ref 0–149)
VLDL Cholesterol Cal: 12 mg/dL (ref 5–40)

## 2021-02-08 LAB — HEMOGLOBIN A1C
Est. average glucose Bld gHb Est-mCnc: 111 mg/dL
Hgb A1c MFr Bld: 5.5 % (ref 4.8–5.6)

## 2021-02-08 LAB — CBC
Hematocrit: 39.2 % (ref 34.0–46.6)
Hemoglobin: 13.5 g/dL (ref 11.1–15.9)
MCH: 31.8 pg (ref 26.6–33.0)
MCHC: 34.4 g/dL (ref 31.5–35.7)
MCV: 93 fL (ref 79–97)
Platelets: 375 10*3/uL (ref 150–450)
RBC: 4.24 x10E6/uL (ref 3.77–5.28)
RDW: 12.9 % (ref 11.7–15.4)
WBC: 4.4 10*3/uL (ref 3.4–10.8)

## 2021-02-08 LAB — TSH: TSH: 1.06 u[IU]/mL (ref 0.450–4.500)

## 2021-03-07 ENCOUNTER — Other Ambulatory Visit: Payer: Self-pay

## 2021-03-12 ENCOUNTER — Ambulatory Visit (INDEPENDENT_AMBULATORY_CARE_PROVIDER_SITE_OTHER): Payer: No Typology Code available for payment source | Admitting: Internal Medicine

## 2021-03-12 ENCOUNTER — Other Ambulatory Visit: Payer: Self-pay

## 2021-03-12 ENCOUNTER — Encounter: Payer: Self-pay | Admitting: Internal Medicine

## 2021-03-12 VITALS — BP 124/84 | HR 71 | Temp 95.9°F | Ht 66.0 in | Wt 260.2 lb

## 2021-03-12 DIAGNOSIS — I10 Essential (primary) hypertension: Secondary | ICD-10-CM

## 2021-03-12 DIAGNOSIS — H0520 Unspecified exophthalmos: Secondary | ICD-10-CM | POA: Diagnosis not present

## 2021-03-12 DIAGNOSIS — F330 Major depressive disorder, recurrent, mild: Secondary | ICD-10-CM

## 2021-03-12 DIAGNOSIS — H04129 Dry eye syndrome of unspecified lacrimal gland: Secondary | ICD-10-CM

## 2021-03-12 MED ORDER — TIRZEPATIDE 2.5 MG/0.5ML ~~LOC~~ SOAJ
2.5000 mg | SUBCUTANEOUS | 2 refills | Status: DC
  Filled 2021-04-03: qty 2, 28d supply, fill #0
  Filled 2021-05-15: qty 2, 28d supply, fill #1
  Filled 2021-07-19: qty 2, 28d supply, fill #2

## 2021-03-12 NOTE — Patient Instructions (Signed)
  I recommend a medication to help you lose weight called Mounjaro.  You can read about it and obtain the $25 copay card on their website: [email or web address]  Melanie Chavez is a medication that is taken as a weekly subcutaneous injection. It is not insulin.  It  causes your pancreas to increase its  own insulin secretion  And also slows down the emptying of your stomach,  So it decreases your appetite and helps you lose weight.  The dose for the first 4 weekly doses is 2.5 mg .  You may have mild nausea on the first or second day but this should resolve.  If not  ,  stop the medication.   As long as you are losing weight,  you can continue the dose you are on .  Only increase the dose to  5.0 mg  after 4 weeks if your weight has plateaued.  Let me know when you need a refill and what dose you are taking.

## 2021-03-12 NOTE — Progress Notes (Signed)
Subjective:  Patient ID: Melanie Chavez, female    DOB: February 23, 1971  Age: 50 y.o. MRN: [id number]  CC: The primary encounter diagnosis was Dry eye. Diagnoses of Hypertension, unspecified type, Exophthalmos of unknown etiology, Major depressive disorder, recurrent episode, mild ([place]), and Morbid obesity ([place]) were also pertinent to this visit.  HPI Melanie Chavez presents for follow up on hypertension , morbid obesity, recent complaint of eyelid twitching, and exophthalmos  Chief Complaint  Patient presents with   Follow-up    Follow up on hypertension   This visit occurred during the SARS-CoV-2 public health emergency.  Safety protocols were in place, including screening questions prior to the visit, additional usage of staff PPE, and extensive cleaning of exam room while observing appropriate contact time as indicated for disinfecting solutions.   Referred by [place] to Ophthalmology for evaluation of dry eye aggravated by exophthalmos , along with persistent eyelid twitching.   Seen by  [facility],  told she had dry eye and advised to use OTC lubricants several times daily .  She uses them during the day but notes that eyelids still twitch if she stops using the drops.  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  .  Follow up  with Dr Edison Pace is in January.  No previous test for sjogren's.  TSH has been repeatedly normal.  Wakes up with very dry eyes.   Obesity: "I've cut back on some things"  but not exercising since Mallory started college.   Depression screen positive :  "I have a lot of stuff going on"  but declines to elaborate,, does not want medication. Financial difficulties aggravating her feelings of low self worth,  does not endorse guilt, no regrets,  but wonders sometimes why her circumstances are so difficult.    Outpatient Medications Prior to Visit  Medication Sig Dispense Refill   amLODipine (NORVASC) 5 MG tablet TAKE 1&1/2 TABLETS (7.5 MG TOTAL) BY MOUTH DAILY.  135 tablet 3   gabapentin (NEURONTIN) 300 MG capsule TAKE 1 CAPSULE (300 MG TOTAL) BY MOUTH NIGHTLY 90 capsule 1   losartan-hydrochlorothiazide (HYZAAR) 100-25 MG tablet Take 1 tablet by mouth daily. 90 tablet 3   metoprolol succinate (TOPROL-XL) 50 MG 24 hr tablet TAKE 1 TABLET BY MOUTH DAILY. TAKE WITH OR IMMEDIATELY FOLLOWING A MEAL. 90 tablet 3   zolpidem (AMBIEN) 5 MG tablet Take 1 tablet (5 mg total) by mouth at bedtime as needed. 30 tablet 5   metFORMIN (GLUCOPHAGE) 500 MG tablet TAKE 1 TABLET BY MOUTH DAILY WITH BREAKFAST. 90 tablet 3   Facility-Administered Medications Prior to Visit  Medication Dose Route Frequency Provider Last Rate Last Admin   triamcinolone acetonide (KENALOG) 10 MG/ML injection 10 mg  10 mg Other Once Landis Martins, DPM       triamcinolone acetonide (KENALOG-40) injection 20 mg  20 mg Other Once Landis Martins, DPM        Review of Systems;  Patient denies headache, fevers, malaise, unintentional weight loss, skin rash, eye pain, sinus congestion and sinus pain, sore throat, dysphagia,  hemoptysis , cough, dyspnea, wheezing, chest pain, palpitations, orthopnea, edema, abdominal pain, nausea, melena, diarrhea, constipation, flank pain, dysuria, hematuria, urinary  Frequency, nocturia, numbness, tingling, seizures,  Focal weakness, Loss of consciousness,  Tremor, insomnia, depression, anxiety, and suicidal ideation.      Objective:  BP 124/84 (BP Location: Left Arm, Patient Position: Sitting, Cuff Size: Large)   Pulse 71   Temp (!) 95.9 F (  35.5 C) (Temporal)   Ht 5\' 6"  (1.676 m)   Wt 260 lb 3.2 oz (118 kg)   SpO2 99%   BMI 42.00 kg/m   BP Readings from Last 3 Encounters:  03/12/21 124/84  02/07/21 126/85  10/25/20 132/80    Wt Readings from Last 3 Encounters:  03/12/21 260 lb 3.2 oz (118 kg)  02/07/21 260 lb 9.6 oz (118.2 kg)  11/07/20 265 lb (120.2 kg)    General appearance: alert, cooperative and appears stated age Ears: normal TM's and  external ear canals both ears Throat: lips, mucosa, and tongue normal; teeth and gums normal Neck: no adenopathy, no carotid bruit, supple, symmetrical, trachea midline and thyroid not enlarged, symmetric, no tenderness/mass/nodules Back: symmetric, no curvature. ROM normal. No CVA tenderness. Lungs: clear to auscultation bilaterally Heart: regular rate and rhythm, S1, S2 normal, no murmur, click, rub or gallop Abdomen: soft, non-tender; bowel sounds normal; no masses,  no organomegaly Pulses: 2+ and symmetric Skin: Skin color, texture, turgor normal. No rashes or lesions Lymph nodes: Cervical, supraclavicular, and axillary nodes normal.  Lab Results  Component Value Date   HGBA1C 5.5 02/07/2021   HGBA1C 5.5 10/25/2020   HGBA1C 5.7 03/13/2020    Lab Results  Component Value Date   CREATININE 0.81 02/07/2021   CREATININE 1.02 10/25/2020   CREATININE 0.68 03/13/2020    Lab Results  Component Value Date   WBC 4.4 02/07/2021   HGB 13.5 02/07/2021   HCT 39.2 02/07/2021   PLT 375 02/07/2021   GLUCOSE 76 02/07/2021   CHOL 120 02/07/2021   TRIG 52 02/07/2021   HDL 46 02/07/2021   LDLDIRECT 54.0 08/07/2015   LDLCALC 62 02/07/2021   ALT 19 10/25/2020   AST 20 10/25/2020   NA 140 02/07/2021   K 4.2 02/07/2021   CL 100 02/07/2021   CREATININE 0.81 02/07/2021   BUN 11 02/07/2021   CO2 22 02/07/2021   TSH 1.060 02/07/2021   HGBA1C 5.5 02/07/2021   MICROALBUR 5.4 (H) 03/19/2017    MM 3D SCREEN BREAST BILATERAL  Result Date: 05/07/2020 CLINICAL DATA:  Screening. EXAM: DIGITAL SCREENING BILATERAL MAMMOGRAM WITH TOMO AND CAD COMPARISON:  None. ACR Breast Density Category b: There are scattered areas of fibroglandular density. FINDINGS: There are no findings suspicious for malignancy. Images were processed with CAD. IMPRESSION: No mammographic evidence of malignancy. A result letter of this screening mammogram will be mailed directly to the patient. RECOMMENDATION: Screening  mammogram in one year. (Code:SM-B-01Y) BI-RADS CATEGORY  1: Negative. Electronically Signed   By: Marin Olp M.D.   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  Advised to add a gel lubricant at night (eg, Blink) , continue water based during the day,  Ruling out Sjogren's syndrome       Relevant Orders   Sjogrens syndrome-A extractable nuclear antibody   Sjogrens syndrome-B extractable nuclear antibody   Exophthalmos of unknown etiology    Screening for hyperthyroidism has been  repeatedly normal .  Ophthalmology evaluation also done and no pathologic cause found.  She states that the condition has been present since infancy      Major depressive disorder, recurrent episode, mild ([place])    She is not suicidal.  She is reluctant to share her  feelings and the nature of stress,  But has been financially stressed as a single mother with a daughter in college. Encouraged to consider treatment with SSRI and/or talk therapy      Morbid obesity ([place])    With hypertension and OSA .  She is not exercising.  Recommending trial of Mounjaro for appetite suppression and encouraged to prioritize daily exercise        Relevant Medications   tirzepatide (MOUNJARO) 2.5 MG/0.5ML Pen   Other Visit Diagnoses     Hypertension, unspecified type       Relevant Orders   Comprehensive metabolic panel      I spent 30 minttes dedicated to the care of this patient on the date of this encounter to include pre-visit review of her medical history,  recent Ophthalmology evaluation, counselling on obesity and depression in Face-to-face time with the patient , and post visit ordering of testing and therapeutics.  Meds ordered this encounter  Medications   tirzepatide (MOUNJARO) 2.5 MG/0.5ML Pen    Sig: Inject 2.5 mg into  the skin once a week.    Dispense:  2 mL    Refill:  2    There are no discontinued medications.  Follow-up: No follow-ups on file.   [name], MD

## 2021-03-13 DIAGNOSIS — F330 Major depressive disorder, recurrent, mild: Secondary | ICD-10-CM | POA: Insufficient documentation

## 2021-03-13 DIAGNOSIS — H04129 Dry eye syndrome of unspecified lacrimal gland: Secondary | ICD-10-CM | POA: Insufficient documentation

## 2021-03-13 DIAGNOSIS — H0520 Unspecified exophthalmos: Secondary | ICD-10-CM | POA: Insufficient documentation

## 2021-03-13 NOTE — Assessment & Plan Note (Signed)
With hypertension and OSA .  She is not exercising.  Recommending trial of Mounjaro for appetite suppression and encouraged to prioritize daily exercise

## 2021-03-13 NOTE — Assessment & Plan Note (Signed)
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  Advised to add a gel lubricant at night (eg, Blink) , continue water based during the day,  Ruling out Sjogren's syndrome

## 2021-03-13 NOTE — Assessment & Plan Note (Signed)
Screening for hyperthyroidism has been  repeatedly normal .  Ophthalmology evaluation also done and no pathologic cause found.  She states that the condition has been present since infancy

## 2021-03-13 NOTE — Assessment & Plan Note (Signed)
She is not suicidal.  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 Encouraged to consider treatment with SSRI and/or talk therapy

## 2021-03-16 ENCOUNTER — Other Ambulatory Visit: Payer: No Typology Code available for payment source

## 2021-03-16 NOTE — Addendum Note (Signed)
Addended by: Leeanne Rio on: 03/16/2021 02:17 PM   Modules accepted: Orders

## 2021-03-30 NOTE — Addendum Note (Signed)
Addended by: Leeanne Rio on: 03/30/2021 05:18 PM   Modules accepted: Orders

## 2021-04-02 ENCOUNTER — Other Ambulatory Visit: Payer: Self-pay | Admitting: Internal Medicine

## 2021-04-03 ENCOUNTER — Other Ambulatory Visit: Payer: Self-pay

## 2021-04-03 ENCOUNTER — Other Ambulatory Visit: Payer: Self-pay | Admitting: Internal Medicine

## 2021-04-03 MED FILL — Metformin HCl Tab 500 MG: ORAL | 90 days supply | Qty: 90 | Fill #0 | Status: AC

## 2021-04-04 ENCOUNTER — Other Ambulatory Visit: Payer: Self-pay

## 2021-04-19 ENCOUNTER — Other Ambulatory Visit
Admission: RE | Admit: 2021-04-19 | Discharge: 2021-04-19 | Disposition: A | Discharge status: Home / Self Care | Payer: No Typology Code available for payment source | Source: Ambulatory Visit | Attending: Internal Medicine | Admitting: Internal Medicine

## 2021-04-19 DIAGNOSIS — E876 Hypokalemia: Secondary | ICD-10-CM | POA: Diagnosis present

## 2021-04-19 DIAGNOSIS — T502X5A Adverse effect of carbonic-anhydrase inhibitors, benzothiadiazides and other diuretics, initial encounter: Secondary | ICD-10-CM | POA: Diagnosis not present

## 2021-04-19 LAB — COMPREHENSIVE METABOLIC PANEL
ALT: 16 U/L (ref 0–44)
AST: 20 U/L (ref 15–41)
Albumin: 4.4 g/dL (ref 3.5–5.0)
Alkaline Phosphatase: 51 U/L (ref 38–126)
Anion gap: 10 (ref 5–15)
BUN: 15 mg/dL (ref 6–20)
CO2: 22 mmol/L (ref 22–32)
Calcium: 9 mg/dL (ref 8.9–10.3)
Chloride: 103 mmol/L (ref 98–111)
Creatinine, Ser: 0.81 mg/dL (ref 0.44–1.00)
GFR, Estimated: >60 mL/min (ref >60)
Glucose, Bld: 134 mg/dL — ABNORMAL HIGH (ref 70–99)
Potassium: 3.3 mmol/L — ABNORMAL LOW (ref 3.5–5.1)
Sodium: 135 mmol/L (ref 135–145)
Total Bilirubin: 0.9 mg/dL (ref 0.3–1.2)
Total Protein: 7.9 g/dL (ref 6.5–8.1)

## 2021-04-20 LAB — SJOGRENS SYNDROME-B EXTRACTABLE NUCLEAR ANTIBODY: SSB (La) (ENA) Antibody, IgG: <0.2 AI (ref 0.0–0.9)

## 2021-04-20 LAB — SJOGRENS SYNDROME-A EXTRACTABLE NUCLEAR ANTIBODY: SSA (Ro) (ENA) Antibody, IgG: <0.2 AI (ref 0.0–0.9)

## 2021-04-22 ENCOUNTER — Other Ambulatory Visit: Payer: Self-pay | Admitting: Internal Medicine

## 2021-04-22 DIAGNOSIS — E876 Hypokalemia: Secondary | ICD-10-CM | POA: Insufficient documentation

## 2021-04-22 DIAGNOSIS — T502X5A Adverse effect of carbonic-anhydrase inhibitors, benzothiadiazides and other diuretics, initial encounter: Secondary | ICD-10-CM | POA: Insufficient documentation

## 2021-04-22 MED ORDER — TELMISARTAN 80 MG PO TABS
80.0000 mg | ORAL_TABLET | Freq: Every day | ORAL | 1 refills | Status: DC
Start: 2021-04-22 — End: 2021-12-17
  Filled 2021-04-22: qty 90, 90d supply, fill #0
  Filled 2021-08-04: qty 90, 90d supply, fill #1

## 2021-04-22 MED ORDER — POTASSIUM CHLORIDE CRYS ER 20 MEQ PO TBCR
20.0000 meq | EXTENDED_RELEASE_TABLET | Freq: Every day | ORAL | 0 refills | Status: DC
  Filled 2021-04-22: qty 30, 30d supply, fill #0

## 2021-04-22 NOTE — Assessment & Plan Note (Signed)
Screening for sjogren syndrome is negative

## 2021-04-22 NOTE — Assessment & Plan Note (Signed)
Stopping hctz and starting telmisartan

## 2021-04-23 ENCOUNTER — Other Ambulatory Visit: Payer: Self-pay

## 2021-05-07 ENCOUNTER — Other Ambulatory Visit: Payer: Self-pay | Admitting: Internal Medicine

## 2021-05-08 ENCOUNTER — Other Ambulatory Visit: Payer: Self-pay

## 2021-05-08 ENCOUNTER — Other Ambulatory Visit: Payer: Self-pay | Admitting: Internal Medicine

## 2021-05-08 NOTE — Telephone Encounter (Signed)
RX Refill: ambien Last Seen: 03-12-21 Last Ordered: 10-25-20 Next Appt: NA

## 2021-05-09 ENCOUNTER — Other Ambulatory Visit: Payer: Self-pay

## 2021-05-09 MED FILL — Zolpidem Tartrate Tab 5 MG: ORAL | 30 days supply | Qty: 30 | Fill #0 | Status: AC

## 2021-05-10 ENCOUNTER — Other Ambulatory Visit: Payer: Self-pay

## 2021-05-10 ENCOUNTER — Ambulatory Visit
Admission: RE | Admit: 2021-05-10 | Discharge: 2021-05-10 | Disposition: A | Discharge status: Home / Self Care | Payer: No Typology Code available for payment source | Source: Ambulatory Visit | Attending: Obstetrics and Gynecology | Admitting: Obstetrics and Gynecology

## 2021-05-10 DIAGNOSIS — Z1231 Encounter for screening mammogram for malignant neoplasm of breast: Secondary | ICD-10-CM | POA: Diagnosis not present

## 2021-05-10 DIAGNOSIS — Z01419 Encounter for gynecological examination (general) (routine) without abnormal findings: Secondary | ICD-10-CM | POA: Insufficient documentation

## 2021-05-16 ENCOUNTER — Other Ambulatory Visit: Payer: Self-pay

## 2021-06-01 ENCOUNTER — Other Ambulatory Visit: Payer: Self-pay

## 2021-06-04 ENCOUNTER — Other Ambulatory Visit: Payer: Self-pay

## 2021-06-04 MED FILL — Zolpidem Tartrate Tab 5 MG: ORAL | 30 days supply | Qty: 30 | Fill #1 | Status: AC

## 2021-06-05 ENCOUNTER — Other Ambulatory Visit: Payer: Self-pay

## 2021-06-21 LAB — HM DIABETES EYE EXAM

## 2021-07-07 MED FILL — Zolpidem Tartrate Tab 5 MG: ORAL | 30 days supply | Qty: 30 | Fill #2 | Status: AC

## 2021-07-09 ENCOUNTER — Other Ambulatory Visit: Payer: Self-pay

## 2021-07-20 ENCOUNTER — Other Ambulatory Visit: Payer: Self-pay

## 2021-07-23 ENCOUNTER — Other Ambulatory Visit: Payer: Self-pay

## 2021-08-04 MED FILL — Metformin HCl Tab 500 MG: ORAL | 90 days supply | Qty: 90 | Fill #1 | Status: AC

## 2021-08-06 ENCOUNTER — Other Ambulatory Visit: Payer: Self-pay

## 2021-08-06 MED FILL — Zolpidem Tartrate Tab 5 MG: ORAL | 30 days supply | Qty: 30 | Fill #3 | Status: AC

## 2021-09-03 ENCOUNTER — Encounter: Payer: Self-pay | Admitting: Internal Medicine

## 2021-09-03 ENCOUNTER — Other Ambulatory Visit: Payer: Self-pay

## 2021-09-03 ENCOUNTER — Ambulatory Visit (INDEPENDENT_AMBULATORY_CARE_PROVIDER_SITE_OTHER): Payer: No Typology Code available for payment source | Admitting: Internal Medicine

## 2021-09-03 VITALS — BP 138/98 | HR 80 | Temp 97.9°F | Ht 66.0 in | Wt 256.0 lb

## 2021-09-03 DIAGNOSIS — H04129 Dry eye syndrome of unspecified lacrimal gland: Secondary | ICD-10-CM | POA: Diagnosis not present

## 2021-09-03 DIAGNOSIS — E785 Hyperlipidemia, unspecified: Secondary | ICD-10-CM | POA: Diagnosis not present

## 2021-09-03 DIAGNOSIS — I10 Essential (primary) hypertension: Secondary | ICD-10-CM | POA: Diagnosis not present

## 2021-09-03 DIAGNOSIS — R7303 Prediabetes: Secondary | ICD-10-CM

## 2021-09-03 DIAGNOSIS — F330 Major depressive disorder, recurrent, mild: Secondary | ICD-10-CM

## 2021-09-03 MED ORDER — METFORMIN HCL 500 MG PO TABS
1000.0000 mg | ORAL_TABLET | Freq: Every day | ORAL | 3 refills | Status: DC
  Filled 2021-09-03: qty 90, 45d supply, fill #0
  Filled 2021-12-17: qty 90, 45d supply, fill #1
  Filled 2022-05-06: qty 90, 45d supply, fill #2
  Filled 2022-07-17: qty 90, 45d supply, fill #3

## 2021-09-03 NOTE — Assessment & Plan Note (Signed)
She declines use of SSRI ?

## 2021-09-03 NOTE — Assessment & Plan Note (Signed)
.  GLP 1 agonists not covered by insurance.  She is walking nearly daily.  Increase metformin to 1000 mg daily  ?

## 2021-09-03 NOTE — Patient Instructions (Addendum)
The Melanie Chavez and Melanie Chavez are not covered by your insurance right now  ? ? So  Please increase the metformin to 2 tablets daily  at dimnertime to see if it  helps curb your appetite  ? ?Your blood pressure is too high. It should be 120/70 ,  not higher than 130/80 ? ?If your home readings are not there, please let me know so I can adjust your medications  ?

## 2021-09-03 NOTE — Assessment & Plan Note (Addendum)
Not at goal on 3 meds.  home readings needed.  Needs secondary cuases ruled out but she continues to decline ?

## 2021-09-03 NOTE — Progress Notes (Signed)
? ?Subjective:  ?Patient ID: Melanie Chavez, female    DOB: 17-Jan-1971  Age: 51 y.o. MRN: [id number] ? ?CC: The primary encounter diagnosis was Essential hypertension. Diagnoses of Prediabetes, Hyperlipidemia, unspecified hyperlipidemia type, Dry eye, Hypertension, unspecified type, Morbid obesity ([place]), and Major depressive disorder, recurrent episode, mild ([place]) were also pertinent to this visit. ? ? ?This visit occurred during the SARS-CoV-2 public health emergency.  Safety protocols were in place, including screening questions prior to the visit, additional usage of staff PPE, and extensive cleaning of exam room while observing appropriate contact time as indicated for disinfecting solutions.   ? ?HPI ?[name] presents for follow up on morbid obesity, hypertension, prediabetes and insomnia  ?Chief Complaint  ?Patient presents with  ? Follow-up  ?  53mof/u and med refills  ? ?1) HTN  Patient is taking her medications as prescribed and notes no adverse effects.  Home BP readings have been done about once per week and are  generally < 130/80 .  She is avoiding added salt in her diet and walking regularly about 3 times per week for exercise  .  ? ?2) Obesity:  lost 4 lbs on the first month of mounjaro , which she paid $25 for the first month before insurance stopped covering  ? ?3) Insomnia : ambien dependent.  ? ?Outpatient Medications Prior to Visit  ?Medication Sig Dispense Refill  ? amLODipine (NORVASC) 5 MG tablet TAKE 1&1/2 TABLETS (7.5 MG TOTAL) BY MOUTH DAILY. 135 tablet 3  ? metoprolol succinate (TOPROL-XL) 50 MG 24 hr tablet TAKE 1 TABLET BY MOUTH DAILY. TAKE WITH OR IMMEDIATELY FOLLOWING A MEAL. 90 tablet 3  ? potassium chloride SA (KLOR-CON) 20 MEQ tablet Take 1 tablet (20 mEq total) by mouth daily. 30 tablet 0  ? telmisartan (MICARDIS) 80 MG tablet Take 1 tablet (80 mg total) by mouth daily. 90 tablet 1  ? zolpidem (AMBIEN) 5 MG tablet Take 1 tablet (5 mg total) by mouth at bedtime as  needed. 30 tablet 5  ? metFORMIN (GLUCOPHAGE) 500 MG tablet TAKE 1 TABLET BY MOUTH DAILY WITH BREAKFAST. 90 tablet 3  ? tirzepatide ([facility] 2.5 MG/0.5ML Pen Inject 2.5 mg into the skin once a week. 2 mL 2  ? gabapentin (NEURONTIN) 300 MG capsule TAKE 1 CAPSULE (300 MG TOTAL) BY MOUTH NIGHTLY 90 capsule 1  ? triamcinolone acetonide (KENALOG) 10 MG/ML injection 10 mg     ? triamcinolone acetonide (KENALOG-40) injection 20 mg     ? ?No facility-administered medications prior to visit.  ? ? ?Review of Systems; ? ?Patient denies headache, fevers, malaise, unintentional weight loss, skin rash, eye pain, sinus congestion and sinus pain, sore throat, dysphagia,  hemoptysis , cough, dyspnea, wheezing, chest pain, palpitations, orthopnea, edema, abdominal pain, nausea, melena, diarrhea, constipation, flank pain, dysuria, hematuria, urinary  Frequency, nocturia, numbness, tingling, seizures,  Focal weakness, Loss of consciousness,  Tremor, insomnia, depression, anxiety, and suicidal ideation.   ? ? ? ?Objective:  ?BP (!) 138/98 (BP Location: Left Arm, Patient Position: Sitting, Cuff Size: Large)   Pulse 80   Temp 97.9 ?F (36.6 ?C) (Oral)   Ht _0  (1.676 m)   Wt 256 lb (116.1 kg)   SpO2 96%   BMI 41.32 kg/m?  ? ?BP Readings from Last 3 Encounters:  ?09/03/21 (!) 138/98  ?03/12/21 124/84  ?02/07/21 126/85  ? ? ?Wt Readings from Last 3 Encounters:  ?09/03/21 256 lb (116.1 kg)  ?03/12/21 260 lb 3.2 oz (  118 kg)  ?02/07/21 260 lb 9.6 oz (118.2 kg)  ? ? ?General appearance: alert, cooperative and appears stated age ?Ears: normal TM's and external ear canals both ears ?Throat: lips, mucosa, and tongue normal; teeth and gums normal ?Neck: no adenopathy, no carotid bruit, supple, symmetrical, trachea midline and thyroid not enlarged, symmetric, no tenderness/mass/nodules ?Back: symmetric, no curvature. ROM normal. No CVA tenderness. ?Lungs: clear to auscultation bilaterally ?Heart: regular rate and rhythm, S1, S2 normal, no  murmur, click, rub or gallop ?Abdomen: soft, non-tender; bowel sounds normal; no masses,  no organomegaly ?Pulses: 2+ and symmetric ?Skin: Skin color, texture, turgor normal. No rashes or lesions ?Lymph nodes: Cervical, supraclavicular, and axillary nodes normal. ? ?Lab Results  ?Component Value Date  ? HGBA1C 5.5 02/07/2021  ? HGBA1C 5.5 10/25/2020  ? HGBA1C 5.7 03/13/2020  ? ? ?Lab Results  ?Component Value Date  ? CREATININE 0.81 04/19/2021  ? CREATININE 0.81 02/07/2021  ? CREATININE 1.02 10/25/2020  ? ? ?Lab Results  ?Component Value Date  ? WBC 4.4 02/07/2021  ? HGB 13.5 02/07/2021  ? HCT 39.2 02/07/2021  ? PLT 375 02/07/2021  ? GLUCOSE 134 (H) 04/19/2021  ? CHOL 120 02/07/2021  ? TRIG 52 02/07/2021  ? HDL 46 02/07/2021  ? LDLDIRECT 54.0 08/07/2015  ? [place] 62 02/07/2021  ? ALT 16 04/19/2021  ? AST 20 04/19/2021  ? NA 135 04/19/2021  ? K 3.3 (L) 04/19/2021  ? CL 103 04/19/2021  ? CREATININE 0.81 04/19/2021  ? BUN 15 04/19/2021  ? CO2 22 04/19/2021  ? TSH 1.060 02/07/2021  ? HGBA1C 5.5 02/07/2021  ? MICROALBUR 5.4 (H) 03/19/2017  ? ? ?MM 3D SCREEN BREAST BILATERAL ? ?Result Date: 05/10/2021 ?CLINICAL DATA:  Screening. EXAM: DIGITAL SCREENING BILATERAL MAMMOGRAM WITH TOMOSYNTHESIS AND CAD TECHNIQUE: Bilateral screening digital craniocaudal and mediolateral oblique mammograms were obtained. Bilateral screening digital breast tomosynthesis was performed. The images were evaluated with computer-aided detection. COMPARISON:  Previous exam(s). ACR Breast Density Category b: There are scattered areas of fibroglandular density. FINDINGS: There are no findings suspicious for malignancy. IMPRESSION: No mammographic evidence of malignancy. A result letter of this screening mammogram will be mailed directly to the patient. RECOMMENDATION: Screening mammogram in one year. (Code:SM-B-01Y) BI-RADS CATEGORY  1: Negative. Electronically Signed   By: Nolon Nations M.D.   On: 05/10/2021 11:39 ? ? ?Assessment & Plan:   ? ?Problem List Items Addressed This Visit   ? ? Morbid obesity ([place])  ?  Marland KitchenGLP 1 agonists not covered by insurance.  She is walking nearly daily.  Increase metformin to 1000 mg daily  ?  ?  ? Relevant Medications  ? metFORMIN (GLUCOPHAGE) 500 MG tablet  ? Major depressive disorder, recurrent episode, mild ([place])  ?  She declines use of SSRI ?  ?  ? Essential hypertension - Primary  ?  Not at goal on 3 meds.  home readings needed.  Needs secondary cuases ruled out but she continues to decline ?  ?  ? Relevant Orders  ? CBC w/Diff  ? Dry eye  ? ?Other Visit Diagnoses   ? ? Prediabetes      ? Relevant Orders  ? HgB A1c  ? Hyperlipidemia, unspecified hyperlipidemia type      ? Relevant Orders  ? Lipid Profile  ? Hypertension, unspecified type      ? Relevant Orders  ? Comp Met (CMET)  ? ?  ? ? ?I spent 30 minutes dedicated to the care of this  patient on the date of this encounter to include pre-visit review of patient's medical history,  most recent imaging studies, Face-to-face time with the patient , and post visit ordering of testing and therapeutics.   ? ?Follow-up: Return in about 6 months (around 03/06/2022). ? ? ?[name], MD ?

## 2021-09-04 ENCOUNTER — Other Ambulatory Visit: Payer: Self-pay

## 2021-09-04 LAB — CBC WITH DIFFERENTIAL/PLATELET
Basophils Absolute: 0.1 10*3/uL (ref 0.0–0.1)
Basophils Relative: 1.3 % (ref 0.0–3.0)
Eosinophils Absolute: 0.2 10*3/uL (ref 0.0–0.7)
Eosinophils Relative: 3.5 % (ref 0.0–5.0)
HCT: 39.8 % (ref 36.0–46.0)
Hemoglobin: 13.4 g/dL (ref 12.0–15.0)
Lymphocytes Relative: 41.5 % (ref 12.0–46.0)
Lymphs Abs: 2.2 10*3/uL (ref 0.7–4.0)
MCHC: 33.5 g/dL (ref 30.0–36.0)
MCV: 95.3 fl (ref 78.0–100.0)
Monocytes Absolute: 0.6 10*3/uL (ref 0.1–1.0)
Monocytes Relative: 11 % (ref 3.0–12.0)
Neutro Abs: 2.3 10*3/uL (ref 1.4–7.7)
Neutrophils Relative %: 42.7 % — ABNORMAL LOW (ref 43.0–77.0)
Platelets: 335 10*3/uL (ref 150.0–400.0)
RBC: 4.18 Mil/uL (ref 3.87–5.11)
RDW: 13.4 % (ref 11.5–15.5)
WBC: 5.3 10*3/uL (ref 4.0–10.5)

## 2021-09-04 LAB — COMPREHENSIVE METABOLIC PANEL
ALT: 14 U/L (ref 0–35)
AST: 17 U/L (ref 0–37)
Albumin: 4.3 g/dL (ref 3.5–5.2)
Alkaline Phosphatase: 49 U/L (ref 39–117)
BUN: 11 mg/dL (ref 6–23)
CO2: 25 mEq/L (ref 19–32)
Calcium: 9.3 mg/dL (ref 8.4–10.5)
Chloride: 103 mEq/L (ref 96–112)
Creatinine, Ser: 0.8 mg/dL (ref 0.40–1.20)
GFR: 86.49 mL/min (ref >60.00)
Glucose, Bld: 92 mg/dL (ref 70–99)
Potassium: 3.9 mEq/L (ref 3.5–5.1)
Sodium: 135 mEq/L (ref 135–145)
Total Bilirubin: 0.4 mg/dL (ref 0.2–1.2)
Total Protein: 7.3 g/dL (ref 6.0–8.3)

## 2021-09-04 LAB — LIPID PANEL
Cholesterol: 136 mg/dL (ref 0–200)
HDL: 53.7 mg/dL (ref >39.00)
LDL Cholesterol: 66 mg/dL (ref 0–99)
NonHDL: 82.24
Total CHOL/HDL Ratio: 3
Triglycerides: 79 mg/dL (ref 0.0–149.0)
VLDL: 15.8 mg/dL (ref 0.0–40.0)

## 2021-09-04 LAB — HEMOGLOBIN A1C: Hgb A1c MFr Bld: 5.3 % (ref 4.6–6.5)

## 2021-09-04 MED FILL — Zolpidem Tartrate Tab 5 MG: ORAL | 30 days supply | Qty: 30 | Fill #4 | Status: AC

## 2021-09-11 ENCOUNTER — Encounter: Payer: Self-pay | Admitting: Internal Medicine

## 2021-09-12 ENCOUNTER — Other Ambulatory Visit: Payer: Self-pay | Admitting: Internal Medicine

## 2021-09-12 MED ORDER — SEMAGLUTIDE-WEIGHT MANAGEMENT 0.25 MG/0.5ML ~~LOC~~ SOAJ
0.2500 mg | SUBCUTANEOUS | 1 refills | Status: AC
  Filled 2021-09-12 – 2021-10-03 (×3): qty 2, 28d supply, fill #0

## 2021-09-13 ENCOUNTER — Other Ambulatory Visit: Payer: Self-pay

## 2021-09-18 ENCOUNTER — Telehealth: Payer: Self-pay

## 2021-09-18 ENCOUNTER — Other Ambulatory Visit: Payer: Self-pay

## 2021-09-18 NOTE — Telephone Encounter (Signed)
PA for Wegovy has been submitted on covermymeds.  °

## 2021-09-25 MED ORDER — WEGOVY 0.25 MG/0.5ML ~~LOC~~ SOAJ
0.2500 mg | SUBCUTANEOUS | 0 refills | Status: DC
Start: 2021-09-25 — End: 2021-11-06
  Filled 2021-09-25: qty 2, 28d supply, fill #0

## 2021-09-25 NOTE — Addendum Note (Signed)
Addended by: Adair Laundry on: 09/25/2021 05:40 PM ? ? Modules accepted: Orders ? ?

## 2021-09-26 ENCOUNTER — Other Ambulatory Visit: Payer: Self-pay

## 2021-10-01 ENCOUNTER — Other Ambulatory Visit: Payer: Self-pay

## 2021-10-03 ENCOUNTER — Other Ambulatory Visit: Payer: Self-pay

## 2021-10-03 NOTE — Telephone Encounter (Signed)
PA has been approved. Pt and pharmacy are aware.  ?

## 2021-10-04 MED FILL — Zolpidem Tartrate Tab 5 MG: ORAL | 30 days supply | Qty: 30 | Fill #5 | Status: AC

## 2021-10-05 ENCOUNTER — Other Ambulatory Visit: Payer: Self-pay

## 2021-10-31 ENCOUNTER — Other Ambulatory Visit: Payer: Self-pay | Admitting: Internal Medicine

## 2021-11-01 ENCOUNTER — Other Ambulatory Visit: Payer: Self-pay | Admitting: Internal Medicine

## 2021-11-01 ENCOUNTER — Other Ambulatory Visit: Payer: Self-pay

## 2021-11-01 ENCOUNTER — Encounter: Payer: Self-pay | Admitting: Internal Medicine

## 2021-11-01 MED FILL — Zolpidem Tartrate Tab 5 MG: ORAL | 30 days supply | Qty: 30 | Fill #0 | Status: CN

## 2021-11-01 NOTE — Telephone Encounter (Signed)
Refilled: 05/09/2021 Last OV: 09/03/2021 Next OV: 03/07/2022

## 2021-11-01 NOTE — Telephone Encounter (Signed)
Pt returning call.... Advised Pt of note below... Pt stated that her starting weight was 264 and her current weight is 253 and she is not having any side effect... Pt requesting callback.Marland KitchenMarland Kitchen

## 2021-11-01 NOTE — Telephone Encounter (Signed)
Lm for pt to cb re :  Starting weight : ? Current weight : ? Side effects ?

## 2021-11-02 ENCOUNTER — Other Ambulatory Visit: Payer: Self-pay

## 2021-11-02 MED FILL — Zolpidem Tartrate Tab 5 MG: ORAL | 30 days supply | Qty: 30 | Fill #0 | Status: AC

## 2021-11-06 ENCOUNTER — Other Ambulatory Visit: Payer: Self-pay | Admitting: Internal Medicine

## 2021-11-06 ENCOUNTER — Other Ambulatory Visit: Payer: Self-pay

## 2021-11-06 MED ORDER — WEGOVY 0.25 MG/0.5ML ~~LOC~~ SOAJ
0.2500 mg | SUBCUTANEOUS | 0 refills | Status: DC
  Filled 2021-11-06: qty 2, 28d supply, fill #0

## 2021-11-07 ENCOUNTER — Other Ambulatory Visit: Payer: Self-pay

## 2021-11-07 ENCOUNTER — Other Ambulatory Visit: Payer: Self-pay | Admitting: Internal Medicine

## 2021-11-07 MED ORDER — SAXENDA 18 MG/3ML ~~LOC~~ SOPN
0.6000 mg | PEN_INJECTOR | Freq: Every day | SUBCUTANEOUS | 0 refills | Status: DC
  Filled 2021-11-07: qty 15, 30d supply, fill #0
  Filled 2021-11-13: qty 15, 45d supply, fill #0

## 2021-11-09 ENCOUNTER — Telehealth: Payer: Self-pay

## 2021-11-09 NOTE — Telephone Encounter (Signed)
PA for Saxenda has been submitted on covermymeds.  

## 2021-11-13 ENCOUNTER — Other Ambulatory Visit: Payer: Self-pay

## 2021-11-13 MED ORDER — UNIFINE PENTIPS 31G X 5 MM MISC
0 refills | Status: DC
  Filled 2021-11-13: qty 100, 90d supply, fill #0

## 2021-11-15 NOTE — Telephone Encounter (Signed)
PA approved through 03/04/2022. Pt is aware.

## 2021-12-02 ENCOUNTER — Other Ambulatory Visit: Payer: Self-pay

## 2021-12-02 MED FILL — Zolpidem Tartrate Tab 5 MG: ORAL | 30 days supply | Qty: 30 | Fill #1 | Status: AC

## 2021-12-03 ENCOUNTER — Other Ambulatory Visit: Payer: Self-pay

## 2021-12-17 ENCOUNTER — Other Ambulatory Visit: Payer: Self-pay | Admitting: Internal Medicine

## 2021-12-17 ENCOUNTER — Other Ambulatory Visit: Payer: Self-pay

## 2021-12-17 MED ORDER — TELMISARTAN 80 MG PO TABS
80.0000 mg | ORAL_TABLET | Freq: Every day | ORAL | 1 refills | Status: DC
  Filled 2021-12-17: qty 90, 90d supply, fill #0
  Filled 2022-05-06: qty 90, 90d supply, fill #1

## 2021-12-17 MED ORDER — METOPROLOL SUCCINATE ER 50 MG PO TB24
ORAL_TABLET | Freq: Every day | ORAL | 3 refills | Status: DC
  Filled 2021-12-17: qty 90, 90d supply, fill #0
  Filled 2022-05-06: qty 90, 90d supply, fill #1
  Filled 2022-07-17: qty 90, 90d supply, fill #2

## 2022-01-03 MED FILL — Zolpidem Tartrate Tab 5 MG: ORAL | 30 days supply | Qty: 30 | Fill #2 | Status: AC

## 2022-01-04 ENCOUNTER — Other Ambulatory Visit: Payer: Self-pay

## 2022-01-14 ENCOUNTER — Encounter: Payer: Self-pay | Admitting: Internal Medicine

## 2022-01-17 ENCOUNTER — Other Ambulatory Visit: Payer: Self-pay

## 2022-01-17 DIAGNOSIS — L80 Vitiligo: Secondary | ICD-10-CM

## 2022-01-31 MED FILL — Zolpidem Tartrate Tab 5 MG: ORAL | 30 days supply | Qty: 30 | Fill #3 | Status: AC

## 2022-02-01 ENCOUNTER — Other Ambulatory Visit: Payer: Self-pay

## 2022-02-07 ENCOUNTER — Encounter: Payer: Self-pay | Admitting: Obstetrics and Gynecology

## 2022-02-07 ENCOUNTER — Ambulatory Visit (INDEPENDENT_AMBULATORY_CARE_PROVIDER_SITE_OTHER): Payer: No Typology Code available for payment source | Admitting: Obstetrics and Gynecology

## 2022-02-07 ENCOUNTER — Other Ambulatory Visit (HOSPITAL_COMMUNITY)
Admission: RE | Admit: 2022-02-07 | Discharge: 2022-02-07 | Disposition: A | Discharge status: Home / Self Care | Payer: No Typology Code available for payment source | Source: Ambulatory Visit | Attending: Obstetrics and Gynecology | Admitting: Obstetrics and Gynecology

## 2022-02-07 VITALS — BP 124/85 | HR 86 | Ht 66.0 in | Wt 245.2 lb

## 2022-02-07 DIAGNOSIS — Z124 Encounter for screening for malignant neoplasm of cervix: Secondary | ICD-10-CM | POA: Insufficient documentation

## 2022-02-07 DIAGNOSIS — Z01419 Encounter for gynecological examination (general) (routine) without abnormal findings: Secondary | ICD-10-CM | POA: Diagnosis present

## 2022-02-07 DIAGNOSIS — Z1231 Encounter for screening mammogram for malignant neoplasm of breast: Secondary | ICD-10-CM

## 2022-02-07 NOTE — Progress Notes (Signed)
HPI:      [name] is a 51 y.o. G2P1011 who LMP was No LMP recorded. (Menstrual status: IUD).  Subjective:   She presents today for her annual examination.  She has no complaints.  She has Mirena IUD and is not having menstrual periods.  She occasionally has some spotting.  She is not having any menopausal symptoms yet.    Hx: The following portions of the patient's history were reviewed and updated as appropriate:             She  has a past medical history of H/O pelvic inflammatory disease, History of recurrent miscarriages, not currently pregnant, Hypertension, Obesity (BMI 30-39.9), Plantar fasciitis, and Reactive hypoglycemia (2011). She does not have any pertinent problems on file. She  has a past surgical history that includes Cholecystectomy (2007) and Foot surgery (11/04/2019). Her family history includes Birth defects in her paternal uncle; Cancer in her maternal grandmother; Diabetes in her mother; Heart disease in her father, paternal aunt, paternal grandfather, and paternal uncle. She  reports that she has never smoked. She has never used smokeless tobacco. She reports that she does not currently use alcohol. She reports that she does not use drugs. She has a current medication list which includes the following prescription(s): amlodipine, unifine pentips, saxenda, metformin, metoprolol succinate, telmisartan, zolpidem, [DISCONTINUED] hydrochlorothiazide, and [DISCONTINUED] losartan. She is allergic to oyster extract.       Review of Systems:  Review of Systems  Constitutional: Denied constitutional symptoms, night sweats, recent illness, fatigue, fever, insomnia and weight loss.  Eyes: Denied eye symptoms, eye pain, photophobia, vision change and visual disturbance.  Ears/Nose/Throat/Neck: Denied ear, nose, throat or neck symptoms, hearing loss, nasal discharge, sinus congestion and sore throat.  Cardiovascular: Denied cardiovascular symptoms, arrhythmia, chest  pain/pressure, edema, exercise intolerance, orthopnea and palpitations.  Respiratory: Denied pulmonary symptoms, asthma, pleuritic pain, productive sputum, cough, dyspnea and wheezing.  Gastrointestinal: Denied, gastro-esophageal reflux, melena, nausea and vomiting.  Genitourinary: Denied genitourinary symptoms including symptomatic vaginal discharge, pelvic relaxation issues, and urinary complaints.  Musculoskeletal: Denied musculoskeletal symptoms, stiffness, swelling, muscle weakness and myalgia.  Dermatologic: Denied dermatology symptoms, rash and scar.  Neurologic: Denied neurology symptoms, dizziness, headache, neck pain and syncope.  Psychiatric: Denied psychiatric symptoms, anxiety and depression.  Endocrine: Denied endocrine symptoms including hot flashes and night sweats.   Meds:   Current Outpatient Medications on File Prior to Visit  Medication Sig Dispense Refill   amLODipine (NORVASC) 5 MG tablet TAKE 1&1/2 TABLETS (7.5 MG TOTAL) BY MOUTH DAILY. 135 tablet 3   Insulin Pen Needle (UNIFINE PENTIPS) 31G X 5 MM MISC Use with Saxenda 100 each 0   Liraglutide -Weight Management (SAXENDA) 18 MG/3ML SOPN Inject 0.6 mg into the skin daily. Increase dose weekly as follows: Week 2: 1.2 mg daily ; Week 3: 1.8 mg daily; Week 4: 2.4 mg daily 15 mL 0   metFORMIN (GLUCOPHAGE) 500 MG tablet Take 2 tablets (1,000 mg total) by mouth daily with supper. 90 tablet 3   metoprolol succinate (TOPROL-XL) 50 MG 24 hr tablet TAKE 1 TABLET BY MOUTH DAILY. TAKE WITH OR IMMEDIATELY FOLLOWING A MEAL. 90 tablet 3   telmisartan (MICARDIS) 80 MG tablet Take 1 tablet (80 mg total) by mouth daily. 90 tablet 1   zolpidem (AMBIEN) 5 MG tablet Take 1 tablet (5 mg total) by mouth at bedtime as needed. 30 tablet 5   [DISCONTINUED] hydrochlorothiazide (MICROZIDE) 12.5 MG capsule Take 2 capsules (25 mg total) by  mouth daily. 90 capsule 3   [DISCONTINUED] losartan (COZAAR) 100 MG tablet TAKE 1 TABLET BY MOUTH ONCE DAILY WITH  HCTZ 90 tablet 3   No current facility-administered medications on file prior to visit.     Objective:     Vitals:   02/07/22 0804  BP: 124/85  Pulse: 86    Filed Weights   02/07/22 0804  Weight: 245 lb 3.2 oz (111.2 kg)              Physical examination General NAD, Conversant  HEENT Atraumatic; Op clear with mmm.  Normo-cephalic. Pupils reactive. Anicteric sclerae  Thyroid/Neck Smooth without nodularity or enlargement. Normal ROM.  Neck Supple.  Skin No rashes, lesions or ulceration. Normal palpated skin turgor. No nodularity.  Breasts: No masses or discharge.  Symmetric.  No axillary adenopathy.  Lungs: Clear to auscultation.No rales or wheezes. Normal Respiratory effort, no retractions.  Heart: NSR.  No murmurs or rubs appreciated. No periferal edema  Abdomen: Soft.  Non-tender.  No masses.  No HSM. No hernia  Extremities: Moves all appropriately.  Normal ROM for age. No lymphadenopathy.  Neuro: Oriented to PPT.  Normal mood. Normal affect.     Pelvic:   Vulva: Normal appearance.  No lesions.  Vagina: No lesions or abnormalities noted.  Support: Normal pelvic support.  Urethra No masses tenderness or scarring.  Meatus Normal size without lesions or prolapse.  Cervix: Normal appearance.  No lesions.  IUD strings noted at cervical os  Anus: Normal exam.  No lesions.  Perineum: Normal exam.  No lesions.        Bimanual   Uterus: Normal size.  Non-tender.  Mobile.  AV.  Adnexae: No masses.  Non-tender to palpation.  Cul-de-sac: Negative for abnormality.     Assessment:    G2P1011 Patient Active Problem List   Diagnosis Date Noted   Diuretic-induced hypokalemia 04/22/2021   Dry eye 03/13/2021   Exophthalmos of unknown etiology 03/13/2021   Major depressive disorder, recurrent episode, mild ([place]) 03/13/2021   Dry eye syndrome of both eyes 11/07/2020   Weakness of right hand 03/10/2020   Ganglion cyst of foot 01/24/2019   [name] 12/31/2018   Morbid  obesity ([place]) 12/31/2018   Sciatica without back pain 12/09/2018   Educated about COVID-19 virus infection 12/09/2018   Insomnia 07/22/2014   Snoring 05/03/2013   Visit for preventive health examination 10/17/2011   H/O pelvic inflammatory disease    Reactive hypoglycemia    History of recurrent miscarriages, not currently pregnant    Essential hypertension 07/17/2011     1. Well woman exam with routine gynecological exam   2. Cervical cancer screening   3. Screening mammogram for breast cancer        Plan:            1.  Basic Screening Recommendations The basic screening recommendations for asymptomatic women were discussed with the patient during her visit.  The age-appropriate recommendations were discussed with her and the rational for the tests reviewed.  When I am informed by the patient that another primary care physician has previously obtained the age-appropriate tests and they are up-to-date, only outstanding tests are ordered and referrals given as necessary.  Abnormal results of tests will be discussed with her when all of her results are completed.  Routine preventative health maintenance measures emphasized: Exercise/Diet/Weight control, Tobacco Warnings, Alcohol/Substance use risks and Stress Management Pap performed-mammogram in December.  Orders Orders Placed This Encounter  Procedures  MM DIGITAL SCREENING BILATERAL    No orders of the defined types were placed in this encounter.         F/U  No follow-ups on file.  Finis Bud, M.D. 02/07/2022 8:33 AM

## 2022-02-07 NOTE — Progress Notes (Signed)
Patients presents for annual exam today. She states continuing to do well with IUD, inserted 03/2019. Due for pap smear, ordered. Patient is due for mammogram, ordered. Annual labs declined. Patient states no other questions or concerns at this time.

## 2022-02-13 LAB — CYTOLOGY - PAP
Comment: NEGATIVE
Diagnosis: UNDETERMINED — AB
High risk HPV: NEGATIVE

## 2022-02-28 ENCOUNTER — Telehealth: Payer: Self-pay

## 2022-02-28 NOTE — Telephone Encounter (Signed)
PA for Kirke Shaggy has been submitted on covermymeds.

## 2022-03-03 MED FILL — Zolpidem Tartrate Tab 5 MG: ORAL | 30 days supply | Qty: 30 | Fill #4 | Status: AC

## 2022-03-04 ENCOUNTER — Other Ambulatory Visit: Payer: Self-pay

## 2022-03-07 ENCOUNTER — Encounter: Payer: Self-pay | Admitting: Internal Medicine

## 2022-03-07 ENCOUNTER — Other Ambulatory Visit: Payer: Self-pay

## 2022-03-07 ENCOUNTER — Ambulatory Visit (INDEPENDENT_AMBULATORY_CARE_PROVIDER_SITE_OTHER): Payer: No Typology Code available for payment source | Admitting: Internal Medicine

## 2022-03-07 VITALS — BP 120/82 | HR 74 | Temp 97.9°F | Ht 66.0 in | Wt 243.0 lb

## 2022-03-07 DIAGNOSIS — Z23 Encounter for immunization: Secondary | ICD-10-CM

## 2022-03-07 DIAGNOSIS — R7303 Prediabetes: Secondary | ICD-10-CM

## 2022-03-07 DIAGNOSIS — I10 Essential (primary) hypertension: Secondary | ICD-10-CM | POA: Diagnosis not present

## 2022-03-07 DIAGNOSIS — Z1211 Encounter for screening for malignant neoplasm of colon: Secondary | ICD-10-CM

## 2022-03-07 DIAGNOSIS — E785 Hyperlipidemia, unspecified: Secondary | ICD-10-CM | POA: Diagnosis not present

## 2022-03-07 DIAGNOSIS — R5383 Other fatigue: Secondary | ICD-10-CM

## 2022-03-07 DIAGNOSIS — F330 Major depressive disorder, recurrent, mild: Secondary | ICD-10-CM

## 2022-03-07 DIAGNOSIS — F5104 Psychophysiologic insomnia: Secondary | ICD-10-CM

## 2022-03-07 LAB — CBC WITH DIFFERENTIAL/PLATELET
Basophils Absolute: 0 10*3/uL (ref 0.0–0.1)
Basophils Relative: 1.2 % (ref 0.0–3.0)
Eosinophils Absolute: 0.2 10*3/uL (ref 0.0–0.7)
Eosinophils Relative: 4.5 % (ref 0.0–5.0)
HCT: 38.7 % (ref 36.0–46.0)
Hemoglobin: 13 g/dL (ref 12.0–15.0)
Lymphocytes Relative: 35.8 % (ref 12.0–46.0)
Lymphs Abs: 1.5 10*3/uL (ref 0.7–4.0)
MCHC: 33.5 g/dL (ref 30.0–36.0)
MCV: 95.2 fl (ref 78.0–100.0)
Monocytes Absolute: 0.5 10*3/uL (ref 0.1–1.0)
Monocytes Relative: 12.2 % — ABNORMAL HIGH (ref 3.0–12.0)
Neutro Abs: 1.9 10*3/uL (ref 1.4–7.7)
Neutrophils Relative %: 46.3 % (ref 43.0–77.0)
Platelets: 346 10*3/uL (ref 150.0–400.0)
RBC: 4.07 Mil/uL (ref 3.87–5.11)
RDW: 13.1 % (ref 11.5–15.5)
WBC: 4.1 10*3/uL (ref 4.0–10.5)

## 2022-03-07 LAB — MICROALBUMIN / CREATININE URINE RATIO
Creatinine,U: 75.6 mg/dL
Microalb Creat Ratio: 0.9 mg/g (ref 0.0–30.0)
Microalb, Ur: <0.7 mg/dL (ref 0.0–1.9)

## 2022-03-07 LAB — COMPREHENSIVE METABOLIC PANEL
ALT: 12 U/L (ref 0–35)
AST: 15 U/L (ref 0–37)
Albumin: 4.5 g/dL (ref 3.5–5.2)
Alkaline Phosphatase: 57 U/L (ref 39–117)
BUN: 10 mg/dL (ref 6–23)
CO2: 26 mEq/L (ref 19–32)
Calcium: 9.5 mg/dL (ref 8.4–10.5)
Chloride: 102 mEq/L (ref 96–112)
Creatinine, Ser: 0.84 mg/dL (ref 0.40–1.20)
GFR: 81.28 mL/min (ref >60.00)
Glucose, Bld: 79 mg/dL (ref 70–99)
Potassium: 4.3 mEq/L (ref 3.5–5.1)
Sodium: 135 mEq/L (ref 135–145)
Total Bilirubin: 0.9 mg/dL (ref 0.2–1.2)
Total Protein: 7.4 g/dL (ref 6.0–8.3)

## 2022-03-07 LAB — LIPID PANEL
Cholesterol: 134 mg/dL (ref 0–200)
HDL: 47.8 mg/dL (ref >39.00)
LDL Cholesterol: 75 mg/dL (ref 0–99)
NonHDL: 85.84
Total CHOL/HDL Ratio: 3
Triglycerides: 53 mg/dL (ref 0.0–149.0)
VLDL: 10.6 mg/dL (ref 0.0–40.0)

## 2022-03-07 LAB — TSH: TSH: 0.9 u[IU]/mL (ref 0.35–5.50)

## 2022-03-07 LAB — LDL CHOLESTEROL, DIRECT: Direct LDL: 83 mg/dL

## 2022-03-07 LAB — HEMOGLOBIN A1C: Hgb A1c MFr Bld: 5.4 % (ref 4.6–6.5)

## 2022-03-07 MED ORDER — SAXENDA 18 MG/3ML ~~LOC~~ SOPN
1.2000 mg | PEN_INJECTOR | Freq: Every day | SUBCUTANEOUS | 0 refills | Status: DC
  Filled 2022-03-07 – 2022-03-08 (×2): qty 15, 75d supply, fill #0

## 2022-03-07 MED ORDER — ZOLPIDEM TARTRATE 5 MG PO TABS
5.0000 mg | ORAL_TABLET | Freq: Every evening | ORAL | 5 refills | Status: DC | PRN
  Filled 2022-03-07 – 2022-03-31 (×2): qty 30, 30d supply, fill #0
  Filled 2022-04-30: qty 30, 30d supply, fill #1
  Filled 2022-06-02: qty 30, 30d supply, fill #2
  Filled 2022-07-02: qty 30, 30d supply, fill #3
  Filled 2022-08-01: qty 30, 30d supply, fill #4
  Filled 2022-09-01: qty 30, 30d supply, fill #5

## 2022-03-07 NOTE — Patient Instructions (Addendum)
You are doing really well on the metformin/Saxenda combination!    You received your Tetanus booster today . Your arm may be sore for a few days.  Take tylenol/motrin if needed  I have refilled your meds for 6 more months and referred you to [facility] for your colonoscopy   [place]

## 2022-03-07 NOTE — Assessment & Plan Note (Signed)
.  GLP 1 agonists not covered by insurance.  She is losing weight with Saxenda and metformin .  walking nearly daily.

## 2022-03-07 NOTE — Progress Notes (Signed)
Subjective:  Patient ID: Melanie Chavez, female    DOB: [birth year]  Age: 51 y.o. MRN: [id number]  CC: The primary encounter diagnosis was Essential hypertension. Diagnoses of Prediabetes, Hyperlipidemia, unspecified hyperlipidemia type, Other fatigue, Colon cancer screening, Major depressive disorder, recurrent episode, mild ([place]), Need for Tdap vaccination, Psychophysiological insomnia, and Morbid obesity ([place]) were also pertinent to this visit.   HPI Melanie Chavez presents for follow up on depression,  insomnia , obesity and hypertension  Chief Complaint  Patient presents with   Follow-up    6 month follow up     Positive depression screen : at her last visit she was offered SSRI therapy but declined.  Stressors include living with mother, financial difficulties related to [employer] education,  and lack of social outlets.  She states today that she is feeling better than previously .  She is working a second job at [employer] several nights per week and [employer]. Melanie Chavez is doing well in school and is now 20 years olf.   Obesity:  use of Wegovy changed to [place]  . Dow 27 lbs, ,she is using 1.2 mg  every other day to limit nausea that occurs with daily dosing .  1.6 mg dose made her nauseated as well    Hypertension:  Patient is taking her medications as prescribed and notes no adverse effects.  Home BP readings have been done about once per week and are  generally < 130/80 .  She is avoiding added salt in her diet and walking regularly about 3 times per week for exercise  .   Insomnia;  continues be dependent on ambien    Outpatient Medications Prior to Visit  Medication Sig Dispense Refill   amLODipine (NORVASC) 5 MG tablet TAKE 1&1/2 TABLETS (7.5 MG TOTAL) BY MOUTH DAILY. 135 tablet 3   Insulin Pen Needle (UNIFINE PENTIPS) 31G X 5 MM MISC Use with Saxenda 100 each 0   metFORMIN (GLUCOPHAGE) 500 MG tablet Take 2 tablets (1,000 mg total) by mouth daily with  supper. 90 tablet 3   metoprolol succinate (TOPROL-XL) 50 MG 24 hr tablet TAKE 1 TABLET BY MOUTH DAILY. TAKE WITH OR IMMEDIATELY FOLLOWING A MEAL. 90 tablet 3   telmisartan (MICARDIS) 80 MG tablet Take 1 tablet (80 mg total) by mouth daily. 90 tablet 1   Liraglutide -Weight Management (SAXENDA) 18 MG/3ML SOPN Inject 0.6 mg into the skin daily. Increase dose weekly as follows: Week 2: 1.2 mg daily ; Week 3: 1.8 mg daily; Week 4: 2.4 mg daily 15 mL 0   zolpidem (AMBIEN) 5 MG tablet Take 1 tablet (5 mg total) by mouth at bedtime as needed. 30 tablet 5   No facility-administered medications prior to visit.    Review of Systems;  Patient denies headache, fevers, malaise, unintentional weight loss, skin rash, eye pain, sinus congestion and sinus pain, sore throat, dysphagia,  hemoptysis , cough, dyspnea, wheezing, chest pain, palpitations, orthopnea, edema, abdominal pain, nausea, melena, diarrhea, constipation, flank pain, dysuria, hematuria, urinary  Frequency, nocturia, numbness, tingling, seizures,  Focal weakness, Loss of consciousness,  Tremor, insomnia, depression, anxiety, and suicidal ideation.      Objective:  BP 120/82 (BP Location: Left Arm, Patient Position: Sitting, Cuff Size: Large)   Pulse 74   Temp 97.9 F (36.6 C) (Oral)   Ht _0  (1.676 m)   Wt 243 lb (110.2 kg)   SpO2 99%   BMI 39.22 kg/m   BP Readings from Last  3 Encounters:  03/07/22 120/82  02/07/22 124/85  09/03/21 (!) 138/98    Wt Readings from Last 3 Encounters:  03/07/22 243 lb (110.2 kg)  02/07/22 245 lb 3.2 oz (111.2 kg)  09/03/21 256 lb (116.1 kg)    General appearance: alert, cooperative and appears stated age Ears: normal TM's and external ear canals both ears Throat: lips, mucosa, and tongue normal; teeth and gums normal Neck: no adenopathy, no carotid bruit, supple, symmetrical, trachea midline and thyroid not enlarged, symmetric, no tenderness/mass/nodules Back: symmetric, no curvature. ROM  normal. No CVA tenderness. Lungs: clear to auscultation bilaterally Heart: regular rate and rhythm, S1, S2 normal, no murmur, click, rub or gallop Abdomen: soft, non-tender; bowel sounds normal; no masses,  no organomegaly Pulses: 2+ and symmetric Skin: Skin color, texture, turgor normal. No rashes or lesions Lymph nodes: Cervical, supraclavicular, and axillary nodes normal. Neuro:  awake and interactive with normal mood and affect. Higher cortical functions are normal. Speech is clear without word-finding difficulty or dysarthria. Extraocular movements are intact. Visual fields of both eyes are grossly intact. Sensation to light touch is grossly intact bilaterally of upper and lower extremities. Motor examination shows 4+/5 symmetric hand grip and upper extremity and 5/5 lower extremity strength. There is no pronation or drift. Gait is non-ataxic  Psych: affect normal, makes good eye contact. No fidgeting,  Cries and Smiles easily.  Denies suicidal thoughts    Lab Results  Component Value Date   HGBA1C 5.4 03/07/2022   HGBA1C 5.3 09/03/2021   HGBA1C 5.5 02/07/2021    Lab Results  Component Value Date   CREATININE 0.84 03/07/2022   CREATININE 0.80 09/03/2021   CREATININE 0.81 04/19/2021    Lab Results  Component Value Date   WBC 4.1 03/07/2022   HGB 13.0 03/07/2022   HCT 38.7 03/07/2022   PLT 346.0 03/07/2022   GLUCOSE 79 03/07/2022   CHOL 134 03/07/2022   TRIG 53.0 03/07/2022   HDL 47.80 03/07/2022   LDLDIRECT 83.0 03/07/2022   LDLCALC 75 03/07/2022   ALT 12 03/07/2022   AST 15 03/07/2022   NA 135 03/07/2022   K 4.3 03/07/2022   CL 102 03/07/2022   CREATININE 0.84 03/07/2022   BUN 10 03/07/2022   CO2 26 03/07/2022   TSH 0.90 03/07/2022   HGBA1C 5.4 03/07/2022   MICROALBUR <0.7 03/07/2022    No results found.  Assessment & Plan:   Problem List Items Addressed This Visit     Essential hypertension - Primary    Well controlled on current regimen. Renal function  stable, no changes today.  Lab Results  Component Value Date   CREATININE 0.84 03/07/2022   Lab Results  Component Value Date   NA 135 03/07/2022   K 4.3 03/07/2022   CL 102 03/07/2022   CO2 26 03/07/2022         Relevant Orders   Comp Met (CMET) (Completed)   Urine Microalbumin w/creat. ratio (Completed)   Insomnia    managed with ambien prn. The risks and benefits of hypnotics were reviewed with patient today including excessive sedation leading to respiratory depression,  impaired thinking/driving, and addiction.  Patient was advised to avoid concurrent use with alcohol, to use medication only as needed and not to share with others  .           Morbid obesity ([place])    .GLP 1 agonists not covered by insurance.  She is losing weight with Saxenda and metformin .  walking nearly  daily.      Relevant Medications   Liraglutide -Weight Management (SAXENDA) 18 MG/3ML SOPN   Major depressive disorder, recurrent episode, mild ([facility])    Symptoms improving.  declies suicidality and does not want SSRI.  Lonely       Other Visit Diagnoses     Prediabetes       Relevant Orders   HgB A1c (Completed)   Comp Met (CMET) (Completed)   Urine Microalbumin w/creat. ratio (Completed)   Hyperlipidemia, unspecified hyperlipidemia type       Relevant Orders   Lipid Profile (Completed)   Direct LDL (Completed)   Other fatigue       Relevant Orders   TSH (Completed)   CBC with Differential/Platelet (Completed)   Colon cancer screening       Relevant Orders   Ambulatory referral to Gastroenterology   Need for Tdap vaccination       Relevant Orders   Tdap vaccine greater than or equal to 7yo IM (Completed)       I spent a total of 30  minutes with this patient in a face to face visit on the date of this encounter reviewing the last office visit with me in  March,  patient's diet and exercise habits, home blood pressure  readings, counselling on her depression/anxiety,    and post  visit ordering of testing and therapeutics.    Follow-up: Return in about 6 months (around 09/05/2022).   [name], MD

## 2022-03-07 NOTE — Assessment & Plan Note (Signed)
Symptoms improving.  declies suicidality and does not want SSRI.  Lonely

## 2022-03-07 NOTE — Assessment & Plan Note (Signed)
Well controlled on current regimen. Renal function stable, no changes today.  Lab Results  Component Value Date   CREATININE 0.84 03/07/2022   Lab Results  Component Value Date   NA 135 03/07/2022   K 4.3 03/07/2022   CL 102 03/07/2022   CO2 26 03/07/2022

## 2022-03-07 NOTE — Assessment & Plan Note (Signed)
managed with ambien prn. The risks and benefits of hypnotics were reviewed with patient today including excessive sedation leading to respiratory depression,  impaired thinking/driving, and addiction.  Patient was advised to avoid concurrent use with alcohol, to use medication only as needed and not to share with others  .

## 2022-03-08 ENCOUNTER — Other Ambulatory Visit: Payer: Self-pay

## 2022-03-11 ENCOUNTER — Other Ambulatory Visit: Payer: Self-pay

## 2022-03-11 ENCOUNTER — Telehealth: Payer: Self-pay | Admitting: *Deleted

## 2022-03-11 ENCOUNTER — Other Ambulatory Visit: Payer: Self-pay | Admitting: *Deleted

## 2022-03-11 DIAGNOSIS — Z1211 Encounter for screening for malignant neoplasm of colon: Secondary | ICD-10-CM

## 2022-03-11 MED ORDER — NA SULFATE-K SULFATE-MG SULF 17.5-3.13-1.6 GM/177ML PO SOLN
1.0000 | Freq: Once | ORAL | 0 refills | Status: DC
  Filled 2022-03-11 – 2022-04-29 (×2): qty 354, 1d supply, fill #0

## 2022-03-11 NOTE — Telephone Encounter (Signed)
Gastroenterology Pre-Procedure Review  Request Date: 04/09/2022 Requesting Physician: Dr. Marius Ditch  PATIENT REVIEW QUESTIONS: The patient responded to the following health history questions as indicated:    1. Are you having any GI issues? no 2. Do you have a personal history of Polyps? no 3. Do you have a family history of Colon Cancer or Polyps? yes (mother) 4. Diabetes Mellitus? no (Patient is taking Saxenda and metformin due to morbid obesity and prediabetes)  5. Joint replacements in the past 12 months?no 6. Major health problems in the past 3 months?no 7. Any artificial heart valves, MVP, or defibrillator?no    MEDICATIONS & ALLERGIES:    Patient reports the following regarding taking any anticoagulation/antiplatelet therapy:   Plavix, Coumadin, Eliquis, Xarelto, Lovenox, Pradaxa, Brilinta, or Effient? no Aspirin? no  Patient confirms/reports the following medications:  Current Outpatient Medications  Medication Sig Dispense Refill   amLODipine (NORVASC) 5 MG tablet TAKE 1&1/2 TABLETS (7.5 MG TOTAL) BY MOUTH DAILY. 135 tablet 3   Insulin Pen Needle (UNIFINE PENTIPS) 31G X 5 MM MISC Use with Saxenda 100 each 0   Liraglutide -Weight Management (SAXENDA) 18 MG/3ML SOPN Inject 1.2 mg into the skin daily. 15 mL 0   metFORMIN (GLUCOPHAGE) 500 MG tablet Take 2 tablets (1,000 mg total) by mouth daily with supper. 90 tablet 3   metoprolol succinate (TOPROL-XL) 50 MG 24 hr tablet TAKE 1 TABLET BY MOUTH DAILY. TAKE WITH OR IMMEDIATELY FOLLOWING A MEAL. 90 tablet 3   telmisartan (MICARDIS) 80 MG tablet Take 1 tablet (80 mg total) by mouth daily. 90 tablet 1   zolpidem (AMBIEN) 5 MG tablet Take 1 tablet (5 mg total) by mouth at bedtime as needed. 30 tablet 5   No current facility-administered medications for this visit.    Patient confirms/reports the following allergies:  Allergies  Allergen Reactions   Oyster Extract     No orders of the defined types were placed in this  encounter.   AUTHORIZATION INFORMATION Primary Insurance: 1D#: Group #:  Secondary Insurance: 1D#: Group #:  SCHEDULE INFORMATION: Date: 04/09/2022 Time: Location: [place]

## 2022-03-14 NOTE — Telephone Encounter (Signed)
Medication has been approved

## 2022-03-31 ENCOUNTER — Other Ambulatory Visit: Payer: Self-pay | Admitting: Internal Medicine

## 2022-03-31 ENCOUNTER — Other Ambulatory Visit: Payer: Self-pay

## 2022-04-01 ENCOUNTER — Other Ambulatory Visit: Payer: Self-pay

## 2022-04-01 MED ORDER — AMLODIPINE BESYLATE 5 MG PO TABS
ORAL_TABLET | ORAL | 3 refills | Status: DC
  Filled 2022-04-01: qty 135, 90d supply, fill #0
  Filled 2022-07-17: qty 135, 90d supply, fill #1
  Filled 2022-11-28: qty 135, 90d supply, fill #2

## 2022-04-01 NOTE — Telephone Encounter (Signed)
Colonoscopy has been rescheduled to 04/30/22 with Dr. Marius Ditch.  Trish in Endo notified of new date.  Instructions updated to reflect date change.  Thanks,  [place], [place]

## 2022-04-04 ENCOUNTER — Other Ambulatory Visit: Payer: Self-pay

## 2022-04-08 ENCOUNTER — Encounter: Payer: Self-pay | Admitting: Internal Medicine

## 2022-04-08 DIAGNOSIS — L80 Vitiligo: Secondary | ICD-10-CM

## 2022-04-09 NOTE — Telephone Encounter (Signed)
I have resubmitted referral to [facility].

## 2022-04-29 ENCOUNTER — Other Ambulatory Visit: Payer: Self-pay

## 2022-04-29 ENCOUNTER — Encounter: Payer: Self-pay | Admitting: Gastroenterology

## 2022-04-30 ENCOUNTER — Ambulatory Visit: Payer: No Typology Code available for payment source | Admitting: Certified Registered"

## 2022-04-30 ENCOUNTER — Encounter
Admission: RE | Disposition: A | Discharge status: Home / Self Care | Payer: Self-pay | Source: Home / Self Care | Attending: Gastroenterology

## 2022-04-30 ENCOUNTER — Ambulatory Visit
Admission: RE | Admit: 2022-04-30 | Discharge: 2022-04-30 | Disposition: A | Discharge status: Home / Self Care | Payer: No Typology Code available for payment source | Attending: Gastroenterology | Admitting: Gastroenterology

## 2022-04-30 ENCOUNTER — Encounter: Payer: Self-pay | Admitting: Gastroenterology

## 2022-04-30 DIAGNOSIS — Z1211 Encounter for screening for malignant neoplasm of colon: Secondary | ICD-10-CM | POA: Diagnosis not present

## 2022-04-30 DIAGNOSIS — K644 Residual hemorrhoidal skin tags: Secondary | ICD-10-CM | POA: Insufficient documentation

## 2022-04-30 DIAGNOSIS — D120 Benign neoplasm of cecum: Secondary | ICD-10-CM | POA: Diagnosis not present

## 2022-04-30 DIAGNOSIS — Z6838 Body mass index (BMI) 38.0-38.9, adult: Secondary | ICD-10-CM | POA: Diagnosis not present

## 2022-04-30 DIAGNOSIS — K635 Polyp of colon: Secondary | ICD-10-CM

## 2022-04-30 DIAGNOSIS — Z8249 Family history of ischemic heart disease and other diseases of the circulatory system: Secondary | ICD-10-CM | POA: Insufficient documentation

## 2022-04-30 DIAGNOSIS — D124 Benign neoplasm of descending colon: Secondary | ICD-10-CM | POA: Diagnosis not present

## 2022-04-30 DIAGNOSIS — K5730 Diverticulosis of large intestine without perforation or abscess without bleeding: Secondary | ICD-10-CM | POA: Insufficient documentation

## 2022-04-30 DIAGNOSIS — I10 Essential (primary) hypertension: Secondary | ICD-10-CM | POA: Insufficient documentation

## 2022-04-30 DIAGNOSIS — Z79899 Other long term (current) drug therapy: Secondary | ICD-10-CM | POA: Insufficient documentation

## 2022-04-30 HISTORY — PX: COLONOSCOPY WITH PROPOFOL: SHX5780

## 2022-04-30 LAB — POCT PREGNANCY, URINE: Preg Test, Ur: NEGATIVE

## 2022-04-30 SURGERY — COLONOSCOPY WITH PROPOFOL
Anesthesia: General

## 2022-04-30 MED ORDER — PROPOFOL 500 MG/50ML IV EMUL
INTRAVENOUS | Status: DC | PRN
  Administered 2022-04-30: 120 ug/kg/min via INTRAVENOUS

## 2022-04-30 MED ORDER — PROPOFOL 10 MG/ML IV BOLUS
INTRAVENOUS | Status: DC | PRN
  Administered 2022-04-30: 100 mg via INTRAVENOUS

## 2022-04-30 MED ORDER — SODIUM CHLORIDE 0.9 % IV SOLN: INTRAVENOUS | Status: DC

## 2022-04-30 MED ORDER — LIDOCAINE HCL (CARDIAC) PF 100 MG/5ML IV SOSY
PREFILLED_SYRINGE | INTRAVENOUS | Status: DC | PRN
  Administered 2022-04-30: 50 mg via INTRAVENOUS

## 2022-04-30 NOTE — Transfer of Care (Signed)
Immediate Anesthesia Transfer of Care Note  Patient: [name]  Procedure(s) Performed: COLONOSCOPY WITH PROPOFOL  Patient Location: Endoscopy Unit  Anesthesia Type:General  Level of Consciousness: drowsy  Airway & Oxygen Therapy: Patient Spontanous Breathing  Post-op Assessment: Report given to RN  Post vital signs: stable  Last Vitals:  Vitals Value Taken Time  BP    Temp    Pulse    Resp    SpO2      Last Pain:  Vitals:   04/30/22 0732  TempSrc: Temporal  PainSc: 0-No pain         Complications: No notable events documented.

## 2022-04-30 NOTE — H&P (Signed)
[name], MD [address]  [place]  [place], [place] [address]  Main: [phone]  Fax: [phone] Pager: [phone]  Primary Care Physician:  [name], MD Primary Gastroenterologist:  Dr. Cephas Chavez  Pre-Procedure History & Physical: HPI:  Melanie Chavez is a 51 y.o. female is here for an colonoscopy.   Past Medical History:  Diagnosis Date   H/O pelvic inflammatory disease    History of recurrent miscarriages, not currently pregnant    Hypertension    Obesity (BMI 30-39.9)    Plantar fasciitis    Reactive hypoglycemia 2011    Past Surgical History:  Procedure Laterality Date   CHOLECYSTECTOMY  2007   [place]   FOOT SURGERY  11/04/2019    Prior to Admission medications   Medication Sig Start Date End Date Taking? Authorizing Provider  metoprolol succinate (TOPROL-XL) 50 MG 24 hr tablet TAKE 1 TABLET BY MOUTH DAILY. TAKE WITH OR IMMEDIATELY FOLLOWING A MEAL. 12/17/21 12/17/22 Yes [name], MD  zolpidem (AMBIEN) 5 MG tablet Take 1 tablet (5 mg total) by mouth at bedtime as needed. 03/07/22 09/03/22 Yes [name], MD  amLODipine (NORVASC) 5 MG tablet TAKE 1&1/2 TABLETS (7.5 MG TOTAL) BY MOUTH DAILY. 04/01/22 04/01/23  [name], MD  Insulin Pen Needle (UNIFINE PENTIPS) 31G X 5 MM MISC Use with Saxenda 11/13/21     Liraglutide -Weight Management (SAXENDA) 18 MG/3ML SOPN Inject 1.2 mg into the skin daily. 03/07/22   [name], MD  metFORMIN (GLUCOPHAGE) 500 MG tablet Take 2 tablets (1,000 mg total) by mouth daily with supper. 09/03/21 09/03/22  [name], MD  Na Sulfate-K Sulfate-Mg Sulf 17.5-3.13-1.6 GM/177ML SOLN Take 1 kit by mouth once for 1 dose. 03/11/22 04/30/22  Lin Landsman, MD  telmisartan (MICARDIS) 80 MG tablet Take 1 tablet (80 mg total) by mouth daily. 12/17/21   [name], MD  hydrochlorothiazide (MICROZIDE) 12.5 MG capsule Take 2 capsules (25 mg total) by mouth daily. 09/29/19 12/29/19  [name], MD  losartan (COZAAR) 100 MG tablet TAKE 1 TABLET BY MOUTH ONCE DAILY WITH HCTZ 09/02/19 12/29/19  [name], MD    Allergies as of 03/11/2022 - Review Complete 03/07/2022  Allergen Reaction Noted   Oyster extract  08/20/2019    Family History  Problem Relation Age of Onset   Diabetes Mother    Heart disease Father        CAD tobacco    Cancer Maternal Grandmother        throat CA (snuff)   Heart disease Paternal Aunt    Heart disease Paternal Uncle    Birth defects Paternal Uncle    Heart disease Paternal Grandfather    Breast cancer Neg Hx     Social History   Socioeconomic History   Marital status: Single    Spouse name: Not on file   Number of children: Not on file   Years of education: Not on file   Highest education level: Not on file  Occupational History   Not on file  Tobacco Use   Smoking status: Never   Smokeless tobacco: Never  Vaping Use   Vaping Use: Never used  Substance and Sexual Activity   Alcohol use: Not Currently    Comment: Occasionally   Drug use: Never   Sexual activity: Not Currently    Birth control/protection: I.U.D.  Other Topics Concern   Not on file  Social History  Narrative   Not on file   Social Determinants of Health   Financial Resource Strain: Not on file  Food Insecurity: Not on file  Transportation Needs: Not on file  Physical Activity: Not on file  Stress: Not on file  Social Connections: Not on file  Intimate Partner Violence: Not on file    Review of Systems: See HPI, otherwise negative ROS  Physical Exam: BP (!) 132/99   Pulse 64   Temp (!) 97.3 F (36.3 C) (Temporal)   Resp 18   Ht _0  (1.702 m)   Wt 110.2 kg   SpO2 100%   BMI 38.06 kg/m  General:   Alert,  pleasant and cooperative in NAD Head:  Normocephalic and atraumatic. Neck:  Supple; no masses or thyromegaly. Lungs:  Clear throughout to auscultation.    Heart:  Regular rate and rhythm. Abdomen:  Soft, nontender and nondistended.  Normal bowel sounds, without guarding, and without rebound.   Neurologic:  Alert and  oriented x4;  grossly normal neurologically.  Impression/Plan: [name] is here for an colonoscopy to be performed for colon cancer screening  Risks, benefits, limitations, and alternatives regarding  colonoscopy have been reviewed with the patient.  Questions have been answered.  All parties agreeable.   Sherri Sear, MD  04/30/2022, 7:53 AM

## 2022-04-30 NOTE — Anesthesia Postprocedure Evaluation (Signed)
Anesthesia Post Note  Patient: Melanie Chavez  Procedure(s) Performed: COLONOSCOPY WITH PROPOFOL  Patient location during evaluation: PACU Anesthesia Type: General Level of consciousness: awake and awake and alert Pain management: satisfactory to patient Respiratory status: spontaneous breathing and nonlabored ventilation Cardiovascular status: stable Anesthetic complications: no  No notable events documented.   Last Vitals:  Vitals:   04/30/22 0910 04/30/22 0920  BP: (!) 130/94 (!) 145/95  Pulse: 60 60  Resp: 17 16  Temp:    SpO2: 100% 100%    Last Pain:  Vitals:   04/30/22 0920  TempSrc:   PainSc: 0-No pain                 VAN STAVEREN,Brodrick Curran

## 2022-04-30 NOTE — Op Note (Signed)
[facility] Gastroenterology Patient Name: Melanie Chavez Procedure Date: 04/30/2022 8:31 AM MRN: [id number] Account #: [id number] Date of Birth: 1970/08/25 Admit Type: Outpatient Age: 51 Room: [facility] ENDO ROOM 3 Gender: Female Note Status: Finalized Instrument Name: Jasper Riling [id number] Procedure:             Colonoscopy Indications:           Screening for colorectal malignant neoplasm, This is                         the patient's first colonoscopy Providers:             Lin Landsman MD, MD Referring MD:          Deborra Medina, MD (Referring MD) Medicines:             General Anesthesia Complications:         No immediate complications. Estimated blood loss: None. Procedure:             Pre-Anesthesia Assessment:                        - Prior to the procedure, a History and Physical was                         performed, and patient medications and allergies were                         reviewed. The patient is competent. The risks and                         benefits of the procedure and the sedation options and                         risks were discussed with the patient. All questions                         were answered and informed consent was obtained.                         Patient identification and proposed procedure were                         verified by the physician, the nurse, the                         anesthesiologist, the anesthetist and the technician                         in the pre-procedure area in the procedure room in the                         endoscopy suite. Mental Status Examination: alert and                         oriented. Airway Examination: normal oropharyngeal                         airway and neck mobility. Respiratory Examination:  clear to auscultation. CV Examination: normal.                         Prophylactic Antibiotics: The patient does not require                          prophylactic antibiotics. Prior Anticoagulants: The                         patient has taken no anticoagulant or antiplatelet                         agents. ASA Grade Assessment: III - A patient with                         severe systemic disease. After reviewing the risks and                         benefits, the patient was deemed in satisfactory                         condition to undergo the procedure. The anesthesia                         plan was to use general anesthesia. Immediately prior                         to administration of medications, the patient was                         re-assessed for adequacy to receive sedatives. The                         heart rate, respiratory rate, oxygen saturations,                         blood pressure, adequacy of pulmonary ventilation, and                         response to care were monitored throughout the                         procedure. The physical status of the patient was                         re-assessed after the procedure.                        After obtaining informed consent, the colonoscope was                         passed under direct vision. Throughout the procedure,                         the patient's blood pressure, pulse, and oxygen                         saturations were monitored continuously. The  Colonoscope was introduced through the anus and                         advanced to the the cecum, identified by appendiceal                         orifice and ileocecal valve. The colonoscopy was                         performed without difficulty. The patient tolerated                         the procedure well. The quality of the bowel                         preparation was evaluated using the BBPS [facility] Bowel                         Preparation Scale) with scores of: Right Colon = 3,                         Transverse Colon = 3 and Left Colon = 3 (entire mucosa                          seen well with no residual staining, small fragments                         of stool or opaque liquid). The total BBPS score                         equals 9. The ileocecal valve, appendiceal orifice,                         and rectum were photographed. Findings:      The perianal and digital rectal examinations were normal. Pertinent       negatives include normal sphincter tone and no palpable rectal lesions.      Skin tags were found on perianal exam.      Two sessile polyps were found in the descending colon and cecum. The       polyps were 5 to 6 mm in size. These polyps were removed with a cold       snare. Resection and retrieval were complete. Estimated blood loss: none.      Multiple large-mouthed diverticula were found in the sigmoid colon.      Non-bleeding external hemorrhoids were found during retroflexion. The       hemorrhoids were medium-sized. Impression:            - Perianal skin tags found on perianal exam.                        - Two 5 to 6 mm polyps in the descending colon and in                         the cecum, removed with a cold snare. Resected and  retrieved.                        - Diverticulosis in the sigmoid colon.                        - Non-bleeding external hemorrhoids. Recommendation:        - Repeat colonoscopy in 5 years for surveillance of                         multiple polyps.                        - Discharge patient to home (with escort).                        - Resume previous diet today.                        - Continue present medications.                        - Await pathology results. Procedure Code(s):     --- Professional ---                        [phone], Colonoscopy, flexible; with removal of                         tumor(s), polyp(s), or other lesion(s) by snare                         technique Diagnosis Code(s):     --- Professional ---                        K64.4, Residual hemorrhoidal  skin tags                        Z12.11, Encounter for screening for malignant neoplasm                         of colon                        D12.0, Benign neoplasm of cecum                        D12.4, Benign neoplasm of descending colon                        K57.30, Diverticulosis of large intestine without                         perforation or abscess without bleeding CPT copyright 2022 American Medical Association. All rights reserved. The codes documented in this report are preliminary and upon coder review may  be revised to meet current compliance requirements. Dr. Ulyess Mort Lin Landsman MD, MD 04/30/2022 9:00:46 AM This report has been signed electronically. Number of Addenda: 0 Note Initiated On: 04/30/2022 8:31 AM Scope Withdrawal Time: 0 hours 12 minutes 35 seconds  Total Procedure Duration: 0 hours 15 minutes 6 seconds  Estimated Blood Loss:  Estimated blood loss: none.  [facility]

## 2022-04-30 NOTE — Anesthesia Preprocedure Evaluation (Signed)
Anesthesia Evaluation  Patient identified by MRN, date of birth, ID band Patient awake    Reviewed: Allergy & Precautions, NPO status , Patient's Chart, lab work & pertinent test results  Airway Mallampati: II  TM Distance: >3 FB Neck ROM: Full    Dental  (+) Teeth Intact   Pulmonary neg pulmonary ROS   Pulmonary exam normal  + decreased breath sounds      Cardiovascular Exercise Tolerance: Good hypertension, Pt. on medications negative cardio ROS Normal cardiovascular exam Rhythm:Regular Rate:Normal     Neuro/Psych negative neurological ROS  negative psych ROS   GI/Hepatic negative GI ROS, Neg liver ROS,,,  Endo/Other  negative endocrine ROS  Morbid obesity  Renal/GU negative Renal ROS  negative genitourinary   Musculoskeletal   Abdominal  (+) + obese  Peds negative pediatric ROS (+)  Hematology negative hematology ROS (+)   Anesthesia Other Findings Past Medical History: No date: H/O pelvic inflammatory disease No date: History of recurrent miscarriages, not currently pregnant No date: Hypertension No date: Obesity (BMI 30-39.9) No date: Plantar fasciitis 2011: Reactive hypoglycemia  Past Surgical History: 2007: CHOLECYSTECTOMY     Comment:  [facility] 11/04/2019: FOOT SURGERY  BMI    Body Mass Index: 38.06 kg/m      Reproductive/Obstetrics negative OB ROS                             Anesthesia Physical Anesthesia Plan  ASA: 3  Anesthesia Plan: General   Post-op Pain Management:    Induction: Intravenous  PONV Risk Score and Plan: Propofol infusion and TIVA  Airway Management Planned: Natural Airway  Additional Equipment:   Intra-op Plan:   Post-operative Plan:   Informed Consent: I have reviewed the patients History and Physical, chart, labs and discussed the procedure including the risks, benefits and alternatives for the proposed anesthesia with the patient  or authorized representative who has indicated his/her understanding and acceptance.     Dental Advisory Given  Plan Discussed with: CRNA and Surgeon  Anesthesia Plan Comments:        Anesthesia Quick Evaluation

## 2022-05-01 ENCOUNTER — Encounter: Payer: Self-pay | Admitting: Gastroenterology

## 2022-05-01 ENCOUNTER — Other Ambulatory Visit: Payer: Self-pay

## 2022-05-01 LAB — SURGICAL PATHOLOGY

## 2022-05-06 ENCOUNTER — Other Ambulatory Visit: Payer: Self-pay

## 2022-05-13 ENCOUNTER — Ambulatory Visit
Admission: RE | Admit: 2022-05-13 | Discharge: 2022-05-13 | Disposition: A | Discharge status: Home / Self Care | Payer: No Typology Code available for payment source | Source: Ambulatory Visit | Attending: Obstetrics and Gynecology | Admitting: Obstetrics and Gynecology

## 2022-05-13 DIAGNOSIS — Z01419 Encounter for gynecological examination (general) (routine) without abnormal findings: Secondary | ICD-10-CM | POA: Diagnosis not present

## 2022-05-13 DIAGNOSIS — Z1231 Encounter for screening mammogram for malignant neoplasm of breast: Secondary | ICD-10-CM | POA: Insufficient documentation

## 2022-06-04 ENCOUNTER — Other Ambulatory Visit: Payer: Self-pay

## 2022-06-11 ENCOUNTER — Other Ambulatory Visit (HOSPITAL_COMMUNITY): Payer: Self-pay

## 2022-06-11 ENCOUNTER — Telehealth: Payer: Self-pay

## 2022-06-11 NOTE — Telephone Encounter (Signed)
Pharmacy Patient Advocate Encounter   Received notification from [facility] that prior authorization for Saxenda is required/requested.    PA submitted on 06/11/22 to (ins) [employer] via [place] Status is pending

## 2022-06-11 NOTE — Telephone Encounter (Signed)
NOTED

## 2022-06-19 ENCOUNTER — Other Ambulatory Visit: Payer: Self-pay

## 2022-06-19 ENCOUNTER — Other Ambulatory Visit: Payer: Self-pay | Admitting: Internal Medicine

## 2022-06-19 MED ORDER — SAXENDA 18 MG/3ML ~~LOC~~ SOPN
1.2000 mg | PEN_INJECTOR | Freq: Every day | SUBCUTANEOUS | 0 refills | Status: DC
  Filled 2022-06-19: qty 15, 30d supply, fill #0

## 2022-06-20 ENCOUNTER — Other Ambulatory Visit (HOSPITAL_COMMUNITY): Payer: Self-pay

## 2022-06-20 NOTE — Telephone Encounter (Signed)
Follow up for prior auth for Saxenda.   Test claim showed a refill too soon, last filled 06/19/22.  Melanie Chavez has been approved.

## 2022-06-20 NOTE — Telephone Encounter (Signed)
Mychart sent to pt letting her know.

## 2022-06-21 DIAGNOSIS — L80 Vitiligo: Secondary | ICD-10-CM | POA: Diagnosis not present

## 2022-07-03 ENCOUNTER — Other Ambulatory Visit: Payer: Self-pay

## 2022-07-15 ENCOUNTER — Other Ambulatory Visit: Payer: Self-pay

## 2022-09-01 ENCOUNTER — Other Ambulatory Visit: Payer: Self-pay

## 2022-09-02 ENCOUNTER — Other Ambulatory Visit: Payer: Self-pay

## 2022-09-06 ENCOUNTER — Ambulatory Visit: Payer: No Typology Code available for payment source | Admitting: Internal Medicine

## 2022-09-09 ENCOUNTER — Encounter: Payer: Self-pay | Admitting: Internal Medicine

## 2022-09-09 ENCOUNTER — Other Ambulatory Visit: Payer: Self-pay

## 2022-09-09 ENCOUNTER — Ambulatory Visit (INDEPENDENT_AMBULATORY_CARE_PROVIDER_SITE_OTHER): Payer: 59 | Admitting: Internal Medicine

## 2022-09-09 VITALS — BP 122/86 | HR 70 | Temp 98.0°F | Ht 66.0 in | Wt 240.0 lb

## 2022-09-09 DIAGNOSIS — I10 Essential (primary) hypertension: Secondary | ICD-10-CM | POA: Diagnosis not present

## 2022-09-09 DIAGNOSIS — E785 Hyperlipidemia, unspecified: Secondary | ICD-10-CM

## 2022-09-09 DIAGNOSIS — F330 Major depressive disorder, recurrent, mild: Secondary | ICD-10-CM

## 2022-09-09 DIAGNOSIS — F5104 Psychophysiologic insomnia: Secondary | ICD-10-CM | POA: Diagnosis not present

## 2022-09-09 DIAGNOSIS — R7303 Prediabetes: Secondary | ICD-10-CM

## 2022-09-09 LAB — LIPID PANEL
Cholesterol: 139 mg/dL (ref 0–200)
HDL: 50.6 mg/dL (ref >39.00)
LDL Cholesterol: 77 mg/dL (ref 0–99)
NonHDL: 88.44
Total CHOL/HDL Ratio: 3
Triglycerides: 55 mg/dL (ref 0.0–149.0)
VLDL: 11 mg/dL (ref 0.0–40.0)

## 2022-09-09 LAB — COMPREHENSIVE METABOLIC PANEL
ALT: 12 U/L (ref 0–35)
AST: 15 U/L (ref 0–37)
Albumin: 4.2 g/dL (ref 3.5–5.2)
Alkaline Phosphatase: 60 U/L (ref 39–117)
BUN: 8 mg/dL (ref 6–23)
CO2: 26 mEq/L (ref 19–32)
Calcium: 9.1 mg/dL (ref 8.4–10.5)
Chloride: 105 mEq/L (ref 96–112)
Creatinine, Ser: 0.75 mg/dL (ref 0.40–1.20)
GFR: 92.79 mL/min (ref >60.00)
Glucose, Bld: 88 mg/dL (ref 70–99)
Potassium: 4 mEq/L (ref 3.5–5.1)
Sodium: 136 mEq/L (ref 135–145)
Total Bilirubin: 0.5 mg/dL (ref 0.2–1.2)
Total Protein: 7.1 g/dL (ref 6.0–8.3)

## 2022-09-09 LAB — POCT GLYCOSYLATED HEMOGLOBIN (HGB A1C): Hemoglobin A1C: 5.1 % (ref 4.0–5.6)

## 2022-09-09 LAB — LDL CHOLESTEROL, DIRECT: Direct LDL: 77 mg/dL

## 2022-09-09 MED ORDER — FEXOFENADINE HCL 180 MG PO TABS
180.0000 mg | ORAL_TABLET | Freq: Every day | ORAL | 1 refills | Status: DC
  Filled 2022-09-09: qty 90, 90d supply, fill #0

## 2022-09-09 MED ORDER — METFORMIN HCL 500 MG PO TABS
1000.0000 mg | ORAL_TABLET | Freq: Every day | ORAL | 3 refills | Status: DC
  Filled 2022-09-09: qty 180, 90d supply, fill #0
  Filled 2023-01-20: qty 180, 90d supply, fill #1
  Filled 2023-05-21: qty 180, 90d supply, fill #2
  Filled 2023-08-15: qty 180, 90d supply, fill #3

## 2022-09-09 MED ORDER — ZOLPIDEM TARTRATE 5 MG PO TABS
5.0000 mg | ORAL_TABLET | Freq: Every evening | ORAL | 5 refills | Status: DC | PRN
  Filled 2022-09-09 – 2022-10-01 (×2): qty 30, 30d supply, fill #0
  Filled 2022-10-29: qty 30, 30d supply, fill #1
  Filled 2022-11-28: qty 30, 30d supply, fill #2
  Filled 2022-12-24 – 2022-12-27 (×2): qty 30, 30d supply, fill #3
  Filled 2023-02-02: qty 30, 30d supply, fill #4
  Filled 2023-03-04: qty 30, 30d supply, fill #5

## 2022-09-09 MED ORDER — SAXENDA 18 MG/3ML ~~LOC~~ SOPN
1.2000 mg | PEN_INJECTOR | Freq: Every day | SUBCUTANEOUS | 0 refills | Status: DC
  Filled 2022-09-09: qty 15, 75d supply, fill #0
  Filled 2022-09-19: qty 15, 30d supply, fill #0
  Filled 2022-10-10 – 2022-12-10 (×2): qty 15, 75d supply, fill #0

## 2022-09-09 NOTE — Assessment & Plan Note (Signed)
She has lost 16 lbs over the past year using Saxenda but is about to lose coverage of the medication due to [facility] decision .  She is not exercising regularly and not following a strict diet due to being overworked working 2 jobs.   She is annoyed by her mother's perseverance which she characterizes as obsessiveness.  Encouraged her to enlist her mother's help in preparing meals.

## 2022-09-09 NOTE — Patient Instructions (Signed)
For your allergies ,  You can use Benadryl but you should also consider  the alternative:   generic Allegra , available generically as fexofenadine   Your goal for weight loss is 4 lbs per month

## 2022-09-09 NOTE — Progress Notes (Signed)
Subjective:  Patient ID: Melanie Chavez, female    DOB: July 21, 1970  Age: 52 y.o. MRN: [id number]  CC: The primary encounter diagnosis was Essential hypertension. Diagnoses of Hyperlipidemia, unspecified hyperlipidemia type, Prediabetes, Major depressive disorder, recurrent episode, mild, Morbid obesity, and Psychophysiological insomnia were also pertinent to this visit.   HPI Melanie Chavez presents for follow up on obesity with prediabetes, insomnia, and hypertension Chief Complaint  Patient presents with   6 Month Follow-up    Discuss changing Saxenda to Victoza due to insurance.   1) obesity:  has been taking Saxenda since March 2023 and metformin 1000 mg daily .  She is not exercising regularly due to working two jobs. Following a diet about 75% of the time .  Lives with her mother who is losing weight on Ozempic and following a careful diet .  Annoyed by her mother's focused and unyielding approach to management of her diabetes /wverweight    2) Hypertension: patient checks blood pressure twice weekly at home.  Readings have been for the most part > <140/80 at rest . Patient is following a reduce salt diet most days and is taking amlodipine, telmisartan and metoprolol  as prescribed  3)  Insomnia: she remains ambien dependent  Outpatient Medications Prior to Visit  Medication Sig Dispense Refill   amLODipine (NORVASC) 5 MG tablet TAKE 1&1/2 TABLETS (7.5 MG TOTAL) BY MOUTH DAILY. 135 tablet 3   Insulin Pen Needle (UNIFINE PENTIPS) 31G X 5 MM MISC Use with Saxenda 100 each 0   metoprolol succinate (TOPROL-XL) 50 MG 24 hr tablet TAKE 1 TABLET BY MOUTH DAILY. TAKE WITH OR IMMEDIATELY FOLLOWING A MEAL. 90 tablet 3   telmisartan (MICARDIS) 80 MG tablet Take 1 tablet (80 mg total) by mouth daily. 90 tablet 1   Liraglutide -Weight Management (SAXENDA) 18 MG/3ML SOPN Inject 1.2 mg into the skin daily. 15 mL 0   zolpidem (AMBIEN) 5 MG tablet Take 1 tablet (5 mg total) by mouth at  bedtime as needed. 30 tablet 5   metFORMIN (GLUCOPHAGE) 500 MG tablet Take 2 tablets (1,000 mg total) by mouth daily with supper. 90 tablet 3   No facility-administered medications prior to visit.    Review of Systems;  Patient denies headache, fevers, malaise, unintentional weight loss, skin rash, eye pain, sinus congestion and sinus pain, sore throat, dysphagia,  hemoptysis , cough, dyspnea, wheezing, chest pain, palpitations, orthopnea, edema, abdominal pain, nausea, melena, diarrhea, constipation, flank pain, dysuria, hematuria, urinary  Frequency, nocturia, numbness, tingling, seizures,  Focal weakness, Loss of consciousness,  Tremor, insomnia, depression, anxiety, and suicidal ideation.      Objective:  BP 122/86 (BP Location: Left Arm, Patient Position: Sitting, Cuff Size: Large)   Pulse 70   Temp 98 F (36.7 C)   Ht 5\' 6"  (1.676 m)   Wt 240 lb (108.9 kg)   SpO2 100%   BMI 38.74 kg/m   BP Readings from Last 3 Encounters:  09/09/22 122/86  04/30/22 (!) 145/95  03/07/22 120/82    Wt Readings from Last 3 Encounters:  09/09/22 240 lb (108.9 kg)  04/30/22 243 lb (110.2 kg)  03/07/22 243 lb (110.2 kg)    Physical Exam Vitals reviewed.  Constitutional:      General: She is not in acute distress.    Appearance: Normal appearance. She is normal weight. She is not ill-appearing, toxic-appearing or diaphoretic.  HENT:     Head: Normocephalic.  Eyes:     General: No  scleral icterus.       Right eye: No discharge.        Left eye: No discharge.     Conjunctiva/sclera: Conjunctivae normal.     Comments: Mild exophthalmos (Chronic)  Cardiovascular:     Rate and Rhythm: Normal rate and regular rhythm.     Heart sounds: Normal heart sounds.  Pulmonary:     Effort: Pulmonary effort is normal. No respiratory distress.     Breath sounds: Normal breath sounds.  Musculoskeletal:        General: Normal range of motion.  Skin:    General: Skin is warm and dry.  Neurological:      General: No focal deficit present.     Mental Status: She is alert and oriented to person, place, and time. Mental status is at baseline.  Psychiatric:        Mood and Affect: Mood normal.        Behavior: Behavior normal.        Thought Content: Thought content normal.        Judgment: Judgment normal.    Lab Results  Component Value Date   HGBA1C 5.1 09/09/2022   HGBA1C 5.4 03/07/2022   HGBA1C 5.3 09/03/2021    Lab Results  Component Value Date   CREATININE 0.75 09/09/2022   CREATININE 0.84 03/07/2022   CREATININE 0.80 09/03/2021    Lab Results  Component Value Date   WBC 4.1 03/07/2022   HGB 13.0 03/07/2022   HCT 38.7 03/07/2022   PLT 346.0 03/07/2022   GLUCOSE 88 09/09/2022   CHOL 139 09/09/2022   TRIG 55.0 09/09/2022   HDL 50.60 09/09/2022   LDLDIRECT 77.0 09/09/2022   LDLCALC 77 09/09/2022   ALT 12 09/09/2022   AST 15 09/09/2022   NA 136 09/09/2022   K 4.0 09/09/2022   CL 105 09/09/2022   CREATININE 0.75 09/09/2022   BUN 8 09/09/2022   CO2 26 09/09/2022   TSH 0.90 03/07/2022   HGBA1C 5.1 09/09/2022   MICROALBUR <0.7 03/07/2022    MM 3D SCREEN BREAST BILATERAL  Result Date: 05/14/2022 CLINICAL DATA:  Screening. EXAM: DIGITAL SCREENING BILATERAL MAMMOGRAM WITH TOMOSYNTHESIS AND CAD TECHNIQUE: Bilateral screening digital craniocaudal and mediolateral oblique mammograms were obtained. Bilateral screening digital breast tomosynthesis was performed. The images were evaluated with computer-aided detection. COMPARISON:  Previous exam(s). ACR Breast Density Category b: There are scattered areas of fibroglandular density. FINDINGS: There are no findings suspicious for malignancy. IMPRESSION: No mammographic evidence of malignancy. A result letter of this screening mammogram will be mailed directly to the patient. RECOMMENDATION: Screening mammogram in one year. (Code:SM-B-01Y) BI-RADS CATEGORY  1: Negative. Electronically Signed   By: Kristopher Oppenheim M.D.   On:  05/14/2022 08:38    Assessment & Plan:  .Essential hypertension Assessment & Plan: Well controlled on current 3 drug regimen of telmisartan, amlodipine and losartan . Renal function stable, no changes today.  Lab Results  Component Value Date   CREATININE 0.75 09/09/2022   Lab Results  Component Value Date   NA 136 09/09/2022   K 4.0 09/09/2022   CL 105 09/09/2022   CO2 26 09/09/2022     Orders: -     Comprehensive metabolic panel  Hyperlipidemia, unspecified hyperlipidemia type -     Comprehensive metabolic panel -     Lipid panel -     LDL cholesterol, direct  Prediabetes -     POCT glycosylated hemoglobin (Hb A1C)  Major depressive disorder, recurrent  episode, mild  Morbid obesity Assessment & Plan: She has lost 16 lbs over the past year using Saxenda but is about to lose coverage of the medication due to [facility] decision .  She is not exercising regularly and not following a strict diet due to being overworked working 2 jobs.   She is annoyed by her mother's perseverance which she characterizes as obsessiveness.  Encouraged her to enlist her mother's help in preparing meals.      Psychophysiological insomnia Assessment & Plan: managed with ambien prn. The risks and benefits of hypnotics were reviewed with patient today including excessive sedation leading to respiratory depression,  impaired thinking/driving, and addiction.  Patient was advised to avoid concurrent use with alcohol, to use medication only as needed and not to share with others  .        Other orders -     metFORMIN HCl; Take 2 tablets (1,000 mg total) by mouth daily with supper.  Dispense: 180 tablet; Refill: 3 -     Saxenda; Inject 1.2 mg into the skin daily.  Dispense: 15 mL; Refill: 0 -     Fexofenadine HCl; Take 1 tablet (180 mg total) by mouth daily.  Dispense: 90 tablet; Refill: 1 -     Zolpidem Tartrate; Take 1 tablet (5 mg total) by mouth at bedtime as needed.  Dispense: 30 tablet;  Refill: 5     I provided 43 minutes on the day of  this encounter reviewing patient's last visit with me, patient's ,previous  labs and imaging studies, counseling on  weight management,  and post visit ordering  of diagnostics and therapeutics .   Follow-up: No follow-ups on file.   [name], MD

## 2022-09-09 NOTE — Assessment & Plan Note (Addendum)
Well controlled on current 3 drug regimen of telmisartan, amlodipine and losartan . Renal function stable, no changes today.  Lab Results  Component Value Date   CREATININE 0.75 09/09/2022   Lab Results  Component Value Date   NA 136 09/09/2022   K 4.0 09/09/2022   CL 105 09/09/2022   CO2 26 09/09/2022

## 2022-09-09 NOTE — Assessment & Plan Note (Signed)
managed with ambien prn. The risks and benefits of hypnotics were reviewed with patient today including excessive sedation leading to respiratory depression,  impaired thinking/driving, and addiction.  Patient was advised to avoid concurrent use with alcohol, to use medication only as needed and not to share with others  .      

## 2022-09-19 ENCOUNTER — Other Ambulatory Visit: Payer: Self-pay | Admitting: Internal Medicine

## 2022-09-19 ENCOUNTER — Other Ambulatory Visit: Payer: Self-pay

## 2022-09-19 MED ORDER — TELMISARTAN 80 MG PO TABS
80.0000 mg | ORAL_TABLET | Freq: Every day | ORAL | 1 refills | Status: DC
  Filled 2022-09-19: qty 90, 90d supply, fill #0
  Filled 2023-01-20: qty 90, 90d supply, fill #1

## 2022-09-23 ENCOUNTER — Encounter: Payer: Self-pay | Admitting: Obstetrics and Gynecology

## 2022-10-02 ENCOUNTER — Other Ambulatory Visit: Payer: Self-pay

## 2022-10-09 ENCOUNTER — Encounter: Payer: Self-pay | Admitting: Internal Medicine

## 2022-10-10 ENCOUNTER — Other Ambulatory Visit: Payer: Self-pay

## 2022-10-30 ENCOUNTER — Other Ambulatory Visit: Payer: Self-pay

## 2022-11-29 ENCOUNTER — Other Ambulatory Visit: Payer: Self-pay

## 2022-12-10 ENCOUNTER — Other Ambulatory Visit: Payer: Self-pay

## 2022-12-25 ENCOUNTER — Other Ambulatory Visit: Payer: Self-pay

## 2022-12-26 ENCOUNTER — Other Ambulatory Visit: Payer: Self-pay

## 2022-12-27 ENCOUNTER — Other Ambulatory Visit: Payer: Self-pay

## 2023-01-20 ENCOUNTER — Other Ambulatory Visit: Payer: Self-pay

## 2023-01-20 ENCOUNTER — Other Ambulatory Visit: Payer: Self-pay | Admitting: Internal Medicine

## 2023-01-20 MED ORDER — METOPROLOL SUCCINATE ER 50 MG PO TB24
50.0000 mg | ORAL_TABLET | Freq: Every day | ORAL | 3 refills | Status: DC
  Filled 2023-01-20: qty 90, 90d supply, fill #0
  Filled 2023-05-21: qty 90, 90d supply, fill #1
  Filled 2023-08-15: qty 90, 90d supply, fill #2
  Filled 2024-01-15: qty 90, 90d supply, fill #3

## 2023-02-03 ENCOUNTER — Other Ambulatory Visit: Payer: Self-pay

## 2023-03-05 ENCOUNTER — Other Ambulatory Visit: Payer: Self-pay

## 2023-03-30 ENCOUNTER — Other Ambulatory Visit: Payer: Self-pay

## 2023-03-30 ENCOUNTER — Other Ambulatory Visit: Payer: Self-pay | Admitting: Internal Medicine

## 2023-03-31 ENCOUNTER — Other Ambulatory Visit: Payer: Self-pay

## 2023-03-31 MED FILL — Zolpidem Tartrate Tab 5 MG: ORAL | 30 days supply | Qty: 30 | Fill #0 | Status: CN

## 2023-03-31 NOTE — Telephone Encounter (Signed)
Requesting: Zolpidem Contract: No UDS: No Last Visit: 09/09/2022 Next Visit: 05/07/2023 Last Refill: 09/09/2022  Please Advise

## 2023-04-01 ENCOUNTER — Other Ambulatory Visit: Payer: Self-pay

## 2023-04-01 MED FILL — Zolpidem Tartrate Tab 5 MG: ORAL | 30 days supply | Qty: 30 | Fill #0 | Status: CN

## 2023-04-02 ENCOUNTER — Other Ambulatory Visit: Payer: Self-pay

## 2023-04-02 MED FILL — Zolpidem Tartrate Tab 5 MG: ORAL | 30 days supply | Qty: 30 | Fill #0 | Status: AC

## 2023-04-11 ENCOUNTER — Encounter: Payer: Self-pay | Admitting: Internal Medicine

## 2023-05-03 MED FILL — Zolpidem Tartrate Tab 5 MG: ORAL | 30 days supply | Qty: 30 | Fill #1 | Status: AC

## 2023-05-05 ENCOUNTER — Other Ambulatory Visit: Payer: Self-pay

## 2023-05-07 ENCOUNTER — Ambulatory Visit (INDEPENDENT_AMBULATORY_CARE_PROVIDER_SITE_OTHER): Payer: 59 | Admitting: Internal Medicine

## 2023-05-07 ENCOUNTER — Other Ambulatory Visit: Payer: Self-pay

## 2023-05-07 ENCOUNTER — Telehealth: Payer: Self-pay

## 2023-05-07 VITALS — BP 118/82 | HR 69 | Temp 98.0°F | Resp 16 | Ht 66.0 in | Wt 254.4 lb

## 2023-05-07 DIAGNOSIS — F5104 Psychophysiologic insomnia: Secondary | ICD-10-CM

## 2023-05-07 DIAGNOSIS — Z1231 Encounter for screening mammogram for malignant neoplasm of breast: Secondary | ICD-10-CM

## 2023-05-07 DIAGNOSIS — E785 Hyperlipidemia, unspecified: Secondary | ICD-10-CM

## 2023-05-07 DIAGNOSIS — I10 Essential (primary) hypertension: Secondary | ICD-10-CM | POA: Diagnosis not present

## 2023-05-07 DIAGNOSIS — M79672 Pain in left foot: Secondary | ICD-10-CM | POA: Insufficient documentation

## 2023-05-07 DIAGNOSIS — Z6841 Body Mass Index (BMI) 40.0 and over, adult: Secondary | ICD-10-CM | POA: Diagnosis not present

## 2023-05-07 LAB — MICROALBUMIN / CREATININE URINE RATIO
Creatinine,U: 163.4 mg/dL
Microalb Creat Ratio: 2.2 mg/g (ref 0.0–30.0)
Microalb, Ur: 3.6 mg/dL — ABNORMAL HIGH (ref 0.0–1.9)

## 2023-05-07 LAB — COMPREHENSIVE METABOLIC PANEL
ALT: 19 U/L (ref 0–35)
AST: 21 U/L (ref 0–37)
Albumin: 4.3 g/dL (ref 3.5–5.2)
Alkaline Phosphatase: 57 U/L (ref 39–117)
BUN: 9 mg/dL (ref 6–23)
CO2: 29 meq/L (ref 19–32)
Calcium: 9.3 mg/dL (ref 8.4–10.5)
Chloride: 103 meq/L (ref 96–112)
Creatinine, Ser: 0.76 mg/dL (ref 0.40–1.20)
GFR: 90.91 mL/min (ref >60.00)
Glucose, Bld: 82 mg/dL (ref 70–99)
Potassium: 3.7 meq/L (ref 3.5–5.1)
Sodium: 138 meq/L (ref 135–145)
Total Bilirubin: 0.6 mg/dL (ref 0.2–1.2)
Total Protein: 7.5 g/dL (ref 6.0–8.3)

## 2023-05-07 MED ORDER — TELMISARTAN 80 MG PO TABS
80.0000 mg | ORAL_TABLET | Freq: Every day | ORAL | 1 refills | Status: DC
  Filled 2023-05-07 – 2023-05-21 (×2): qty 90, 90d supply, fill #0
  Filled 2023-08-15: qty 90, 90d supply, fill #1

## 2023-05-07 MED ORDER — OZEMPIC (0.25 OR 0.5 MG/DOSE) 2 MG/3ML ~~LOC~~ SOPN: 0.2500 mg | PEN_INJECTOR | SUBCUTANEOUS | Status: DC

## 2023-05-07 MED ORDER — AMLODIPINE BESYLATE 5 MG PO TABS
7.5000 mg | ORAL_TABLET | Freq: Every day | ORAL | 3 refills | Status: DC
  Filled 2023-05-07 – 2023-05-21 (×2): qty 135, 90d supply, fill #0
  Filled 2023-08-15: qty 135, 90d supply, fill #1
  Filled 2023-12-23 (×2): qty 135, 90d supply, fill #2
  Filled 2024-03-30: qty 135, 90d supply, fill #3

## 2023-05-07 NOTE — Patient Instructions (Signed)
Start the ozempic at the lowest dose 0.25 mg weekly  Continue the 0.25 mg dose for  MINIMUM OF 4 DOSES  BEFORE CONSIDERING THE HIGHER DOSE  YOU CAN ADJUST THE INTERVAL TO LONGER THE EVERY 7 DAYS IF IT WORKS FOR YOU   GET STARTED ON AN EXERCISE PROGRAM BEFORE YOU RUN OUT OF SAMPLES

## 2023-05-07 NOTE — Telephone Encounter (Signed)
Medication Samples have been provided to the patient.  Drug name: Ozempic       Strength: 0.25 mg/ 0.5 mg        Qty: 3 boxes  LOT: [id number]  Exp.Date: 07/10/2024  Dosing instructions: Inject 0.25 mg into skin once weekly.  The patient has been instructed regarding the correct time, dose, and frequency of taking this medication, including desired effects and most common side effects.   Xane Amsden 9:34 AM 05/07/2023

## 2023-05-07 NOTE — Progress Notes (Signed)
Subjective:  Patient ID: Melanie Chavez, female    DOB: 1971-06-09  Age: 52 y.o. MRN: [id number]  CC: The primary encounter diagnosis was Essential hypertension. Diagnoses of Hyperlipidemia, unspecified hyperlipidemia type, Encounter for screening mammogram for malignant neoplasm of breast, Morbid obesity (HCC), and Psychophysiological insomnia were also pertinent to this visit.   HPI Melanie Chavez presents for  Chief Complaint  Patient presents with   Medical Management of Chronic Issues    Follow up   Obesity:  lst 16 lbs last year from 08/2021 to 09/2022,  but has gained it back since she lost coverage for Saxenda wants to resume mediacationnwith ozempic samples   Bilateral buttock pain has returned  worse in the morning upon walking for the first hour or two.    Used to see Filomena Jungling at [facility] had an ESI in [place] c0222 which proven 90% improvement  on waking.  Also had PT.  Taking tylenol and motrin  in the morning.      Hypertension: patient checks blood pressure twice weekly at home.  Readings have been for the most part <130/80 at rest . Patient is following a reduced salt diet most days and is taking medications as prescribed    Outpatient Medications Prior to Visit  Medication Sig Dispense Refill   fexofenadine (ALLEGRA) 180 MG tablet Take 1 tablet (180 mg total) by mouth daily. 90 tablet 1   metFORMIN (GLUCOPHAGE) 500 MG tablet Take 2 tablets (1,000 mg total) by mouth daily with supper. 180 tablet 3   metoprolol succinate (TOPROL-XL) 50 MG 24 hr tablet Take 1 tablet (50 mg total) by mouth daily. Take with or following a meal. 90 tablet 3   zolpidem (AMBIEN) 5 MG tablet Take 1 tablet (5 mg total) by mouth at bedtime as needed. 30 tablet 5   Insulin Pen Needle (UNIFINE PENTIPS) 31G X 5 MM MISC Use with Saxenda 100 each 0   amLODipine (NORVASC) 5 MG tablet TAKE 1&1/2 TABLETS (7.5 MG TOTAL) BY MOUTH DAILY. 135 tablet 3   Liraglutide -Weight Management (SAXENDA) 18  MG/3ML SOPN Inject 1.2 mg into the skin daily. 15 mL 0   telmisartan (MICARDIS) 80 MG tablet Take 1 tablet (80 mg total) by mouth daily. 90 tablet 1   No facility-administered medications prior to visit.    Review of Systems;  Patient denies headache, fevers, malaise, unintentional weight loss, skin rash, eye pain, sinus congestion and sinus pain, sore throat, dysphagia,  hemoptysis , cough, dyspnea, wheezing, chest pain, palpitations, orthopnea, edema, abdominal pain, nausea, melena, diarrhea, constipation, flank pain, dysuria, hematuria, urinary  Frequency, nocturia, numbness, tingling, seizures,  Focal weakness, Loss of consciousness,  Tremor, insomnia, depression, anxiety, and suicidal ideation.      Objective:  BP 118/82   Pulse 69   Temp 98 F (36.7 C)   Resp 16   Ht 5\' 6"  (1.676 m)   Wt 254 lb 6 oz (115.4 kg)   SpO2 99%   BMI 41.06 kg/m   BP Readings from Last 3 Encounters:  05/07/23 118/82  09/09/22 122/86  04/30/22 (!) 145/95    Wt Readings from Last 3 Encounters:  05/07/23 254 lb 6 oz (115.4 kg)  09/09/22 240 lb (108.9 kg)  04/30/22 243 lb (110.2 kg)    Physical Exam Vitals reviewed.  Constitutional:      General: She is not in acute distress.    Appearance: Normal appearance. She is normal weight. She is not ill-appearing, toxic-appearing or  diaphoretic.  HENT:     Head: Normocephalic.  Eyes:     General: No scleral icterus.       Right eye: No discharge.        Left eye: No discharge.     Conjunctiva/sclera: Conjunctivae normal.  Cardiovascular:     Rate and Rhythm: Normal rate and regular rhythm.     Heart sounds: Normal heart sounds.  Pulmonary:     Effort: Pulmonary effort is normal. No respiratory distress.     Breath sounds: Normal breath sounds.  Musculoskeletal:        General: Normal range of motion.  Skin:    General: Skin is warm and dry.  Neurological:     General: No focal deficit present.     Mental Status: She is alert and oriented  to person, place, and time. Mental status is at baseline.  Psychiatric:        Mood and Affect: Mood normal.        Behavior: Behavior normal.        Thought Content: Thought content normal.        Judgment: Judgment normal.    Lab Results  Component Value Date   HGBA1C 5.1 09/09/2022   HGBA1C 5.4 03/07/2022   HGBA1C 5.3 09/03/2021    Lab Results  Component Value Date   CREATININE 0.76 05/07/2023   CREATININE 0.75 09/09/2022   CREATININE 0.84 03/07/2022    Lab Results  Component Value Date   WBC 4.1 03/07/2022   HGB 13.0 03/07/2022   HCT 38.7 03/07/2022   PLT 346.0 03/07/2022   GLUCOSE 82 05/07/2023   CHOL 139 09/09/2022   TRIG 55.0 09/09/2022   HDL 50.60 09/09/2022   LDLDIRECT 77.0 09/09/2022   LDLCALC 77 09/09/2022   ALT 19 05/07/2023   AST 21 05/07/2023   NA 138 05/07/2023   K 3.7 05/07/2023   CL 103 05/07/2023   CREATININE 0.76 05/07/2023   BUN 9 05/07/2023   CO2 29 05/07/2023   TSH 0.90 03/07/2022   HGBA1C 5.1 09/09/2022   MICROALBUR 3.6 (H) 05/07/2023    MM 3D SCREEN BREAST BILATERAL  Result Date: 05/14/2022 CLINICAL DATA:  Screening. EXAM: DIGITAL SCREENING BILATERAL MAMMOGRAM WITH TOMOSYNTHESIS AND CAD TECHNIQUE: Bilateral screening digital craniocaudal and mediolateral oblique mammograms were obtained. Bilateral screening digital breast tomosynthesis was performed. The images were evaluated with computer-aided detection. COMPARISON:  Previous exam(s). ACR Breast Density Category b: There are scattered areas of fibroglandular density. FINDINGS: There are no findings suspicious for malignancy. IMPRESSION: No mammographic evidence of malignancy. A result letter of this screening mammogram will be mailed directly to the patient. RECOMMENDATION: Screening mammogram in one year. (Code:SM-B-01Y) BI-RADS CATEGORY  1: Negative. Electronically Signed   By: Sande Brothers M.D.   On: 05/14/2022 08:38    Assessment & Plan:  .Essential hypertension Assessment &  Plan: Well controlled on current 3 drug regimen of telmisartan, amlodipine and losartan . Renal function stable, no changes today.  Lab Results  Component Value Date   CREATININE 0.76 05/07/2023   Lab Results  Component Value Date   NA 138 05/07/2023   K 3.7 05/07/2023   CL 103 05/07/2023   CO2 29 05/07/2023     Orders: -     Comprehensive metabolic panel -     Microalbumin / creatinine urine ratio -     amLODIPine Besylate; Take 1.5 tablets (7.5 mg total) by mouth daily.  Dispense: 135 tablet; Refill: 3  Hyperlipidemia, unspecified  hyperlipidemia type  Encounter for screening mammogram for malignant neoplasm of breast -     3D Screening Mammogram, Left and Right; Future  Morbid obesity (HCC) Assessment & Plan: Patient has been unable to lose or maintain a healthy weight despite good effort. Encouraged to increase exercise involvement to include more intense aerobic activity for 30 minutes 5 days per week.  Screened for contraindications to use of  GLP 1 agonists for appetite suppression and she has none.  The risks and benefits of pharmacotherapy discussed and she is requesting a trial of therapy .  Samples of ozempic  given      Psychophysiological insomnia Assessment & Plan: managed with ambien prn. The risks and benefits of hypnotics were reviewed with patient today including excessive sedation leading to respiratory depression,  impaired thinking/driving, and addiction.  Patient was advised to avoid concurrent use with alcohol, to use medication only as needed and not to share with others  .        Other orders -     Telmisartan; Take 1 tablet (80 mg total) by mouth daily.  Dispense: 90 tablet; Refill: 1 -     Ozempic (0.25 or 0.5 MG/DOSE); Inject 0.25 mg into the skin once a week.  Dispense: 9 mL    Follow-up: Return in about 6 months (around 11/04/2023).   Sherlene Shams, MD

## 2023-05-09 NOTE — Assessment & Plan Note (Signed)
managed with ambien prn. The risks and benefits of hypnotics were reviewed with patient today including excessive sedation leading to respiratory depression,  impaired thinking/driving, and addiction.  Patient was advised to avoid concurrent use with alcohol, to use medication only as needed and not to share with others  .      

## 2023-05-09 NOTE — Assessment & Plan Note (Signed)
Patient has been unable to lose or maintain a healthy weight despite good effort. Encouraged to increase exercise involvement to include more intense aerobic activity for 30 minutes 5 days per week.  Screened for contraindications to use of  GLP 1 agonists for appetite suppression and she has none.  The risks and benefits of pharmacotherapy discussed and she is requesting a trial of therapy .  Samples of ozempic  given

## 2023-05-09 NOTE — Assessment & Plan Note (Signed)
Well controlled on current 3 drug regimen of telmisartan, amlodipine and losartan . Renal function stable, no changes today.  Lab Results  Component Value Date   CREATININE 0.76 05/07/2023   Lab Results  Component Value Date   NA 138 05/07/2023   K 3.7 05/07/2023   CL 103 05/07/2023   CO2 29 05/07/2023

## 2023-05-20 ENCOUNTER — Other Ambulatory Visit: Payer: Self-pay

## 2023-05-21 ENCOUNTER — Other Ambulatory Visit: Payer: Self-pay

## 2023-05-21 MED FILL — Zolpidem Tartrate Tab 5 MG: ORAL | 30 days supply | Qty: 30 | Fill #2 | Status: CN

## 2023-06-01 MED FILL — Zolpidem Tartrate Tab 5 MG: ORAL | 30 days supply | Qty: 30 | Fill #2 | Status: AC

## 2023-06-02 ENCOUNTER — Other Ambulatory Visit: Payer: Self-pay

## 2023-06-09 ENCOUNTER — Ambulatory Visit: Payer: 59 | Admitting: Physical Therapy

## 2023-06-10 ENCOUNTER — Ambulatory Visit: Payer: 59 | Admitting: Physical Therapy

## 2023-07-01 MED FILL — Zolpidem Tartrate Tab 5 MG: ORAL | 30 days supply | Qty: 30 | Fill #3 | Status: AC

## 2023-07-02 ENCOUNTER — Other Ambulatory Visit: Payer: Self-pay

## 2023-08-03 MED FILL — Zolpidem Tartrate Tab 5 MG: ORAL | 30 days supply | Qty: 30 | Fill #4 | Status: AC

## 2023-08-04 ENCOUNTER — Other Ambulatory Visit: Payer: Self-pay

## 2023-08-31 MED FILL — Zolpidem Tartrate Tab 5 MG: ORAL | 30 days supply | Qty: 30 | Fill #5 | Status: AC

## 2023-09-01 ENCOUNTER — Other Ambulatory Visit: Payer: Self-pay

## 2023-09-29 ENCOUNTER — Other Ambulatory Visit: Payer: Self-pay

## 2023-09-29 ENCOUNTER — Other Ambulatory Visit: Payer: Self-pay | Admitting: Internal Medicine

## 2023-09-29 MED ORDER — OZEMPIC (0.25 OR 0.5 MG/DOSE) 2 MG/3ML ~~LOC~~ SOPN: 0.2500 mg | PEN_INJECTOR | SUBCUTANEOUS | 0 refills | Status: DC

## 2023-09-29 MED FILL — Zolpidem Tartrate Tab 5 MG: ORAL | 30 days supply | Qty: 30 | Fill #0 | Status: CN

## 2023-09-30 ENCOUNTER — Other Ambulatory Visit: Payer: Self-pay

## 2023-10-01 ENCOUNTER — Other Ambulatory Visit: Payer: Self-pay

## 2023-10-01 MED FILL — Zolpidem Tartrate Tab 5 MG: ORAL | 30 days supply | Qty: 30 | Fill #0 | Status: AC

## 2023-10-29 MED FILL — Zolpidem Tartrate Tab 5 MG: ORAL | 30 days supply | Qty: 30 | Fill #1 | Status: AC

## 2023-10-30 ENCOUNTER — Other Ambulatory Visit: Payer: Self-pay

## 2023-11-10 ENCOUNTER — Other Ambulatory Visit: Payer: Self-pay

## 2023-11-10 ENCOUNTER — Encounter: Payer: Self-pay | Admitting: Internal Medicine

## 2023-11-10 ENCOUNTER — Ambulatory Visit: Payer: 59 | Admitting: Internal Medicine

## 2023-11-10 VITALS — BP 122/82 | HR 80 | Ht 66.0 in | Wt 252.4 lb

## 2023-11-10 DIAGNOSIS — I10 Essential (primary) hypertension: Secondary | ICD-10-CM | POA: Diagnosis not present

## 2023-11-10 DIAGNOSIS — R7303 Prediabetes: Secondary | ICD-10-CM

## 2023-11-10 DIAGNOSIS — E785 Hyperlipidemia, unspecified: Secondary | ICD-10-CM | POA: Diagnosis not present

## 2023-11-10 DIAGNOSIS — F330 Major depressive disorder, recurrent, mild: Secondary | ICD-10-CM

## 2023-11-10 DIAGNOSIS — R5383 Other fatigue: Secondary | ICD-10-CM

## 2023-11-10 LAB — COMPREHENSIVE METABOLIC PANEL WITH GFR
ALT: 18 U/L (ref 0–35)
AST: 19 U/L (ref 0–37)
Albumin: 4.2 g/dL (ref 3.5–5.2)
Alkaline Phosphatase: 50 U/L (ref 39–117)
BUN: 8 mg/dL (ref 6–23)
CO2: 25 meq/L (ref 19–32)
Calcium: 9.2 mg/dL (ref 8.4–10.5)
Chloride: 105 meq/L (ref 96–112)
Creatinine, Ser: 0.85 mg/dL (ref 0.40–1.20)
GFR: 79.19 mL/min (ref >60.00)
Glucose, Bld: 95 mg/dL (ref 70–99)
Potassium: 4.4 meq/L (ref 3.5–5.1)
Sodium: 138 meq/L (ref 135–145)
Total Bilirubin: 0.5 mg/dL (ref 0.2–1.2)
Total Protein: 7.3 g/dL (ref 6.0–8.3)

## 2023-11-10 LAB — CBC WITH DIFFERENTIAL/PLATELET
Basophils Absolute: 0 10*3/uL (ref 0.0–0.1)
Basophils Relative: 0.8 % (ref 0.0–3.0)
Eosinophils Absolute: 0.1 10*3/uL (ref 0.0–0.7)
Eosinophils Relative: 2.8 % (ref 0.0–5.0)
HCT: 39.7 % (ref 36.0–46.0)
Hemoglobin: 13.3 g/dL (ref 12.0–15.0)
Lymphocytes Relative: 42.1 % (ref 12.0–46.0)
Lymphs Abs: 1.7 10*3/uL (ref 0.7–4.0)
MCHC: 33.4 g/dL (ref 30.0–36.0)
MCV: 93.8 fl (ref 78.0–100.0)
Monocytes Absolute: 0.5 10*3/uL (ref 0.1–1.0)
Monocytes Relative: 13.4 % — ABNORMAL HIGH (ref 3.0–12.0)
Neutro Abs: 1.6 10*3/uL (ref 1.4–7.7)
Neutrophils Relative %: 40.9 % — ABNORMAL LOW (ref 43.0–77.0)
Platelets: 338 10*3/uL (ref 150.0–400.0)
RBC: 4.23 Mil/uL (ref 3.87–5.11)
RDW: 12.7 % (ref 11.5–15.5)
WBC: 3.9 10*3/uL — ABNORMAL LOW (ref 4.0–10.5)

## 2023-11-10 LAB — LDL CHOLESTEROL, DIRECT: Direct LDL: 74 mg/dL

## 2023-11-10 LAB — LIPID PANEL
Cholesterol: 127 mg/dL (ref 0–200)
HDL: 46.6 mg/dL (ref >39.00)
LDL Cholesterol: 64 mg/dL (ref 0–99)
NonHDL: 80.76
Total CHOL/HDL Ratio: 3
Triglycerides: 86 mg/dL (ref 0.0–149.0)
VLDL: 17.2 mg/dL (ref 0.0–40.0)

## 2023-11-10 LAB — MICROALBUMIN / CREATININE URINE RATIO
Creatinine,U: 109.1 mg/dL
Microalb Creat Ratio: 8.9 mg/g (ref 0.0–30.0)
Microalb, Ur: 1 mg/dL (ref 0.0–1.9)

## 2023-11-10 LAB — HEMOGLOBIN A1C: Hgb A1c MFr Bld: 5.4 % (ref 4.6–6.5)

## 2023-11-10 LAB — TSH: TSH: 1.46 u[IU]/mL (ref 0.35–5.50)

## 2023-11-10 MED ORDER — TELMISARTAN 80 MG PO TABS
80.0000 mg | ORAL_TABLET | Freq: Every day | ORAL | 1 refills | Status: DC
  Filled 2023-11-10: qty 90, 90d supply, fill #0
  Filled 2024-02-12 – 2024-03-30 (×2): qty 90, 90d supply, fill #1

## 2023-11-10 MED ORDER — BUPROPION HCL ER (XL) 150 MG PO TB24
150.0000 mg | ORAL_TABLET | Freq: Every day | ORAL | 2 refills | Status: DC
  Filled 2023-11-10: qty 30, 30d supply, fill #0

## 2023-11-10 MED ORDER — METFORMIN HCL 500 MG PO TABS
1000.0000 mg | ORAL_TABLET | Freq: Every day | ORAL | 3 refills | Status: AC
  Filled 2023-11-10: qty 180, 90d supply, fill #0
  Filled 2024-03-30: qty 180, 90d supply, fill #1

## 2023-11-10 NOTE — Patient Instructions (Signed)
 I RECOMMEND TREATING YOUR DEPRESSION  .  THIS REQUIRES 2 ACTIONS:   1) TAKE THE WELLBUTRIN  ONCE DAILY IN THE MORNING OR AT LUNCH.  NOT LATER   2)  TALK TO YOUR MOTHER ABOUT YOUR NEED FOR YOUR OWN TIME.    DON'T ASK!!   IT'S YOUR LIFE   AND IT SHOULD BE YOUR CHOICE HOW YOU SPEND YOUR SPARE TIME.  YOU WILL HAVE TO TAKE YOUR SPARE TIME BACK,  BECAUSE SHE HAS GOTTEN USED TO HAVING THINGS HER WAY .  THIS IS PARTLY YOUR FAULT FOR ALLOWING HER TO SET THE SCHEDULE EVERY WEEKEN

## 2023-11-10 NOTE — Progress Notes (Signed)
 Subjective:  Patient ID: Melanie Chavez, female    DOB: 1971-01-19  Age: 53 y.o. MRN: [id number]  CC: The primary encounter diagnosis was Essential hypertension. Diagnoses of Hyperlipidemia, unspecified hyperlipidemia type, Prediabetes, Other fatigue, and Major depressive disorder, recurrent episode, mild (HCC) were also pertinent to this visit.   HPI Jleigh L Paternostro presents for  Chief Complaint  Patient presents with   Medical Management of Chronic Issues    6 month follow up   1) HTN:  Patient is taking her medications as prescribed and notes no adverse effects.  Home BP readings have been done about once per week and are  generally < 130/80 .  She is avoiding added salt in her diet BUT NOT  walking regularly about 3 times per week for exercise  .   2) MORBID OBESITY :she has been unable to lose weight .  She is working 2 jobs.  And all of her spare time is usurped by taking care of her  mother   3) DEPRESSION she is proud of and happy for her daughter Bud Care who has graduated  from [employer], but she is tired of not having her own life,  having friends and having time to her self because her mother monopolizes hte   Outpatient Medications Prior to Visit  Medication Sig Dispense Refill   amLODipine  (NORVASC ) 5 MG tablet Take 1.5 tablets (7.5 mg total) by mouth daily. 135 tablet 3   fexofenadine  (ALLEGRA ) 180 MG tablet Take 1 tablet (180 mg total) by mouth daily. 90 tablet 1   metoprolol  succinate (TOPROL -XL) 50 MG 24 hr tablet Take 1 tablet (50 mg total) by mouth daily. Take with or following a meal. 90 tablet 3   zolpidem  (AMBIEN ) 5 MG tablet Take 1 tablet (5 mg total) by mouth at bedtime as needed. 30 tablet 1   metFORMIN  (GLUCOPHAGE ) 500 MG tablet Take 2 tablets (1,000 mg total) by mouth daily with supper. 180 tablet 3   telmisartan  (MICARDIS ) 80 MG tablet Take 1 tablet (80 mg total) by mouth daily. 90 tablet 1   Semaglutide ,0.25 or 0.5MG /DOS, (OZEMPIC , 0.25 OR 0.5 MG/DOSE,) 2  MG/3ML SOPN Inject 0.25 mg into the skin once a week. (Patient not taking: Reported on 11/10/2023) 9 mL 0   No facility-administered medications prior to visit.    Review of Systems;  Patient denies headache, fevers, malaise, unintentional weight loss, skin rash, eye pain, sinus congestion and sinus pain, sore throat, dysphagia,  hemoptysis , cough, dyspnea, wheezing, chest pain, palpitations, orthopnea, edema, abdominal pain, nausea, melena, diarrhea, constipation, flank pain, dysuria, hematuria, urinary  Frequency, nocturia, numbness, tingling, seizures,  Focal weakness, Loss of consciousness,  Tremor, insomnia, depression, anxiety, and suicidal ideation.      Objective:  BP 122/82   Pulse 80   Ht 5\' 6"  (1.676 m)   Wt 252 lb 6.4 oz (114.5 kg)   SpO2 98%   BMI 40.74 kg/m   BP Readings from Last 3 Encounters:  11/10/23 122/82  05/07/23 118/82  09/09/22 122/86    Wt Readings from Last 3 Encounters:  11/10/23 252 lb 6.4 oz (114.5 kg)  05/07/23 254 lb 6 oz (115.4 kg)  09/09/22 240 lb (108.9 kg)    Physical Exam Vitals reviewed.  Constitutional:      General: She is not in acute distress.    Appearance: Normal appearance. She is normal weight. She is not ill-appearing, toxic-appearing or diaphoretic.  HENT:     Head: Normocephalic.  Eyes:     General: No scleral icterus.       Right eye: No discharge.        Left eye: No discharge.     Conjunctiva/sclera: Conjunctivae normal.  Cardiovascular:     Rate and Rhythm: Normal rate and regular rhythm.     Heart sounds: Normal heart sounds.  Pulmonary:     Effort: Pulmonary effort is normal. No respiratory distress.     Breath sounds: Normal breath sounds.  Musculoskeletal:        General: Normal range of motion.  Skin:    General: Skin is warm and dry.  Neurological:     General: No focal deficit present.     Mental Status: She is alert and oriented to person, place, and time. Mental status is at baseline.  Psychiatric:         Mood and Affect: Mood normal.        Behavior: Behavior normal.        Thought Content: Thought content normal.        Judgment: Judgment normal.    Lab Results  Component Value Date   HGBA1C 5.4 11/10/2023   HGBA1C 5.1 09/09/2022   HGBA1C 5.4 03/07/2022    Lab Results  Component Value Date   CREATININE 0.85 11/10/2023   CREATININE 0.76 05/07/2023   CREATININE 0.75 09/09/2022    Lab Results  Component Value Date   WBC 3.9 (L) 11/10/2023   HGB 13.3 11/10/2023   HCT 39.7 11/10/2023   PLT 338.0 11/10/2023   GLUCOSE 95 11/10/2023   CHOL 127 11/10/2023   TRIG 86.0 11/10/2023   HDL 46.60 11/10/2023   LDLDIRECT 74.0 11/10/2023   LDLCALC 64 11/10/2023   ALT 18 11/10/2023   AST 19 11/10/2023   NA 138 11/10/2023   K 4.4 11/10/2023   CL 105 11/10/2023   CREATININE 0.85 11/10/2023   BUN 8 11/10/2023   CO2 25 11/10/2023   TSH 1.46 11/10/2023   HGBA1C 5.4 11/10/2023   MICROALBUR 1.0 11/10/2023    MM 3D SCREEN BREAST BILATERAL Result Date: 05/14/2022 CLINICAL DATA:  Screening. EXAM: DIGITAL SCREENING BILATERAL MAMMOGRAM WITH TOMOSYNTHESIS AND CAD TECHNIQUE: Bilateral screening digital craniocaudal and mediolateral oblique mammograms were obtained. Bilateral screening digital breast tomosynthesis was performed. The images were evaluated with computer-aided detection. COMPARISON:  Previous exam(s). ACR Breast Density Category b: There are scattered areas of fibroglandular density. FINDINGS: There are no findings suspicious for malignancy. IMPRESSION: No mammographic evidence of malignancy. A result letter of this screening mammogram will be mailed directly to the patient. RECOMMENDATION: Screening mammogram in one year. (Code:[id number]-B-01Y) BI-RADS CATEGORY  1: Negative. Electronically Signed   By: Serena  Chacko M.D.   On: 05/14/2022 08:38    Assessment & Plan:  .Essential hypertension -     Comprehensive metabolic panel with GFR -     Microalbumin / creatinine urine  ratio  Hyperlipidemia, unspecified hyperlipidemia type -     Lipid panel -     LDL cholesterol, direct  Prediabetes -     Comprehensive metabolic panel with GFR -     Hemoglobin A1c  Other fatigue -     TSH -     CBC with Differential/Platelet  Major depressive disorder, recurrent episode, mild (HCC) Assessment & Plan: Symptoms exploredtoday and counselling given.  Trial of wellbutrin.  ETC on e month   Other orders -     metFORMIN  HCl; Take 2 tablets (1,000 mg total) by mouth daily  with supper.  Dispense: 180 tablet; Refill: 3 -     Telmisartan ; Take 1 tablet (80 mg total) by mouth daily.  Dispense: 90 tablet; Refill: 1 -     buPROPion HCl ER (XL); Take 1 tablet (150 mg total) by mouth daily.  Dispense: 30 tablet; Refill: 2     I spent 44 minutes on the day of this face to face encounter reviewing patient's  most recent  labs and imaging studies, counseling on  managing her conflict with her mother ,  reviewing the assessment and plan with patient, and post visit ordering and reviewing of  diagnostics and therapeutics with patient  .   Follow-up: Return in about 4 weeks (around 12/08/2023) for DEPRESSION .   Thersia Flax, MD

## 2023-11-10 NOTE — Assessment & Plan Note (Addendum)
 Symptoms exploredtoday and counselling given.  Trial of wellbutrin.  ETC on e month

## 2023-11-11 ENCOUNTER — Ambulatory Visit: Payer: Self-pay | Admitting: Internal Medicine

## 2023-11-30 ENCOUNTER — Other Ambulatory Visit: Payer: Self-pay | Admitting: Internal Medicine

## 2023-11-30 ENCOUNTER — Other Ambulatory Visit: Payer: Self-pay

## 2023-12-01 ENCOUNTER — Other Ambulatory Visit: Payer: Self-pay

## 2023-12-01 NOTE — Telephone Encounter (Signed)
 Refilled: 09/29/2023 Last OV: 11/10/2023 Next OV: not scheduled

## 2023-12-02 ENCOUNTER — Other Ambulatory Visit: Payer: Self-pay

## 2023-12-02 MED FILL — Zolpidem Tartrate Tab 5 MG: ORAL | 30 days supply | Qty: 30 | Fill #0 | Status: CN

## 2023-12-03 ENCOUNTER — Other Ambulatory Visit: Payer: Self-pay

## 2023-12-03 MED FILL — Zolpidem Tartrate Tab 5 MG: ORAL | 30 days supply | Qty: 30 | Fill #0 | Status: AC

## 2023-12-22 ENCOUNTER — Other Ambulatory Visit: Payer: Self-pay

## 2023-12-22 MED FILL — Zolpidem Tartrate Tab 5 MG: ORAL | 30 days supply | Qty: 30 | Fill #1 | Status: CN

## 2023-12-23 ENCOUNTER — Other Ambulatory Visit: Payer: Self-pay

## 2023-12-23 MED FILL — Zolpidem Tartrate Tab 5 MG: ORAL | 30 days supply | Qty: 30 | Fill #1 | Status: CN

## 2023-12-25 ENCOUNTER — Other Ambulatory Visit: Payer: Self-pay

## 2023-12-31 ENCOUNTER — Other Ambulatory Visit: Payer: Self-pay

## 2024-01-01 ENCOUNTER — Other Ambulatory Visit: Payer: Self-pay

## 2024-01-01 MED FILL — Zolpidem Tartrate Tab 5 MG: ORAL | 30 days supply | Qty: 30 | Fill #1 | Status: AC

## 2024-01-29 ENCOUNTER — Other Ambulatory Visit: Payer: Self-pay

## 2024-01-29 MED FILL — Zolpidem Tartrate Tab 5 MG: ORAL | 30 days supply | Qty: 30 | Fill #2 | Status: AC

## 2024-02-11 ENCOUNTER — Ambulatory Visit: Admitting: Obstetrics and Gynecology

## 2024-02-23 ENCOUNTER — Other Ambulatory Visit: Payer: Self-pay

## 2024-02-27 NOTE — Patient Instructions (Signed)
 Preventive Care 52-52 Years Old, Female Preventive care refers to lifestyle choices and visits with your health care provider that can promote health and wellness. Preventive care visits are also called wellness exams. What can I expect for my preventive care visit? Counseling Your health care provider may ask you questions about your: Medical history, including: Past medical problems. Family medical history. Pregnancy history. Current health, including: Menstrual cycle. Method of birth control. Emotional well-being. Home life and relationship well-being. Sexual activity and sexual health. Lifestyle, including: Alcohol, nicotine or tobacco, and drug use. Access to firearms. Diet, exercise, and sleep habits. 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 Sunscreen use. Safety issues such as seatbelt and bike helmet use. Physical exam Your health care provider will check your: Height and weight. These may be used to calculate your BMI (body mass index). BMI is a measurement that tells if you are at a healthy weight. Waist circumference. This measures the distance around your waistline. This measurement also tells if you are at a healthy weight and may help predict your risk of certain diseases, such as type 2 diabetes and high blood pressure. Heart rate and blood pressure. Body temperature. Skin for abnormal spots. What immunizations do I need?  Vaccines are usually given at various ages, according to a schedule. Your health care provider will recommend vaccines for you based on your age, medical history, and lifestyle or other factors, such as travel or where you work. What tests do I need? Screening Your health care provider may recommend screening tests for certain conditions. This may include: Lipid and cholesterol levels. Diabetes screening. This is done by checking your blood sugar (glucose) after you have not eaten for a while (fasting). Pelvic exam and Pap test. Hepatitis B test. Hepatitis C  test. HIV (human immunodeficiency virus) test. STI (sexually transmitted infection) testing, if you are at risk. Lung cancer screening. Colorectal cancer screening. Mammogram. Talk with your health care provider about when you should start having regular mammograms. This may depend on whether you have a family history of breast cancer. BRCA-related cancer screening. This may be done if you have a family history of breast, ovarian, tubal, or peritoneal cancers. Bone density scan. This is done to screen for osteoporosis. Talk with your health care provider about your test results, treatment options, and if necessary, the need for more tests. Follow these instructions at home: Eating and drinking  Eat a diet that includes fresh fruits and vegetables, whole grains, lean protein, and low-fat dairy products. Take vitamin and mineral supplements as recommended by your health care provider. Do not drink alcohol if: Your health care provider tells you not to drink. You are pregnant, may be pregnant, or are planning to become pregnant. If you drink alcohol: Limit how much you have to 0-1 drink a day. Know how much alcohol is in your drink. In the [place] one drink equals one 12 oz bottle of beer (355 mL), one 5 oz glass of wine (148 mL), or one 1 oz glass of hard liquor (44 mL). Lifestyle Brush your teeth every morning and night with fluoride toothpaste. Floss one time each day. Exercise for at least 30 minutes 5 or more days each week. Do not use any products that contain nicotine or tobacco. These products include cigarettes, chewing tobacco, and vaping devices, such as e-cigarettes. If you need help quitting, ask your health care provider. Do not use drugs. If you are sexually active, practice safe sex. Use a condom or other form of protection to  prevent STIs. If you do not wish to become pregnant, use a form of birth control. If you plan to become pregnant, see your health care provider for a  prepregnancy visit. Take aspirin only as told by your health care provider. Make sure that you understand how much to take and what form to take. Work with your health care provider to find out whether it is safe and beneficial for you to take aspirin daily. Find healthy ways to manage stress, such as: Meditation, yoga, or listening to music. Journaling. Talking to a trusted person. Spending time with friends and family. Minimize exposure to UV radiation to reduce your risk of skin cancer. Safety Always wear your seat belt while driving or riding in a vehicle. Do not drive: If you have been drinking alcohol. Do not ride with someone who has been drinking. When you are tired or distracted. While texting. If you have been using any mind-altering substances or drugs. Wear a helmet and other protective equipment during sports activities. If you have firearms in your house, make sure you follow all gun safety procedures. Seek help if you have been physically or sexually abused. What's next? Visit your health care provider once a year for an annual wellness visit. Ask your health care provider how often you should have your eyes and teeth checked. Stay up to date on all vaccines. This information is not intended to replace advice given to you by your health care provider. Make sure you discuss any questions you have with your health care provider. Document Revised: 11/22/2020 Document Reviewed: 11/22/2020 Elsevier Patient Education  2024 [employer] How to Do a Breast Self-Exam Doing breast self-exams can help you stay healthy. They're one way to know what's normal for your breasts. They can help you catch a problem while it's still small and can be treated. You need to: Check your breasts often. Tell your doctor about any changes. You should do breast self-exams even if you have breast implants. What you need: A mirror. A well-lit room. A pillow or other soft object. How to do a  breast self-exam Look for changes  Take off all the clothes above your waist. Stand in front of a mirror in a room with good lighting. Put your hands down at your sides. Compare your breasts in the mirror. Look for difference between them, such as: Differences in shape. Differences in size. Wrinkles, dips, and bumps in one breast and not the other. Look at each breast for skin changes, such as: Redness. Scaly spots. Spots where your skin is thicker. Dimpling. Open sores. Look for changes in your nipples, such as: Fluid coming out of a nipple. Fluid around a nipple. Bleeding. Dimpling. Redness. A nipple that looks pushed in or that has changed position. Feel for changes Lie on your back. Feel each breast. To do this: Pick a breast to feel. Place a pillow under the shoulder closest to that breast. Put the arm closest to that breast behind your head. Feel the breast using the hand of your other arm. Use the pads of your three middle fingers to make small circles starting near the nipple. Use light, medium, and firm pressure. Keep making circles, moving down over the breast. Stop when you feel your ribs. Start making circles with your fingers again, this time going up until you reach your collarbone. Then, make circles out across your breast and into your armpit area. Squeeze your nipple. Check for fluid and lumps. Do these steps again  to check your other breast. Sit or stand in the tub or shower. With soapy water on your skin, feel each breast the same way you did when you were lying down. Write down what you find Writing down what you find can help you keep track of what you want to tell your doctor. Write down: What's normal for each breast. Any changes you find. Write down: The kind of change. If your breast feels tender or painful. Any lump you find. Write down its size and where it is. When you last had your period. General tips If you're breastfeeding, the best time  to check your breasts is after you feed your baby or after you use a breast pump. If you get a period, the best time to check your breasts is 5-7 days after your period ends. With time, you'll get more used to doing the self-exam. You'll also start to know if there are changes in your breasts. Contact a doctor if: You see a change in the shape or size of your breasts or nipples. You see a change in the skin of your breast or nipples. You have fluid coming from your nipples that isn't normal. You find a new lump or thick area. You have breast pain. You have any concerns about your breast health. This information is not intended to replace advice given to you by your health care provider. Make sure you discuss any questions you have with your health care provider. Document Revised: 08/06/2023 Document Reviewed: 08/06/2023 Elsevier Patient Education  2025 [employer].

## 2024-02-27 NOTE — Progress Notes (Signed)
 GYNECOLOGY ANNUAL PHYSICAL EXAM PROGRESS NOTE  Subjective:    Melanie Chavez is a 53 y.o. G91P1011 female who presents for an annual exam.  The patient is not currently sexually active. The patient participates in regular exercise: no. Has the patient ever been transfused or tattooed?: no. The patient reports that there is not domestic violence in her life.   The patient has the following complaints today: She has no new concerns today.  Menstrual History: Menarche age: 54 No LMP recorded. (Menstrual status: IUD).     Gynecologic History:  Contraception: IUD History of STI's: Denies Last Pap: 01/20/2022. Results were: normal. Notes h/o abnormal pap smears. Last mammogram: 05/13/2022. Results were: normal       OB History  Gravida Para Term Preterm AB Living  2 1 1  0 1 1  SAB IAB Ectopic Multiple Live Births  1 0 0 0 1    # Outcome Date GA Lbr Len/2nd Weight Sex Type Anes PTL Lv  2 Term 2003     Vag-Spont   LIV  1 SAB 2000            Past Medical History:  Diagnosis Date   H/O pelvic inflammatory disease    History of recurrent miscarriages, not currently pregnant    Hypertension    Obesity (BMI 30-39.9)    Plantar fasciitis    Reactive hypoglycemia 2011    Past Surgical History:  Procedure Laterality Date   CHOLECYSTECTOMY  2007   [place]   COLONOSCOPY WITH PROPOFOL  N/A 04/30/2022   Procedure: COLONOSCOPY WITH PROPOFOL ;  Surgeon: Unk Corinn Skiff, MD;  Location: [facility] ENDOSCOPY;  Service: Gastroenterology;  Laterality: N/A;   FOOT SURGERY  11/04/2019    Family History  Problem Relation Age of Onset   Diabetes Mother    Heart disease Father        CAD tobacco    Cancer Maternal Grandmother        throat CA (snuff)   Heart disease Paternal Aunt    Heart disease Paternal Uncle    Birth defects Paternal Uncle    Heart disease Paternal Grandfather    Breast cancer Neg Hx     Social History   Socioeconomic History   Marital status: Single     Spouse name: Not on file   Number of children: Not on file   Years of education: Not on file   Highest education level: Associate degree: academic program  Occupational History   Not on file  Tobacco Use   Smoking status: Never   Smokeless tobacco: Never  Vaping Use   Vaping status: Never Used  Substance and Sexual Activity   Alcohol use: Not Currently    Comment: Occasionally   Drug use: Never   Sexual activity: Not Currently    Birth control/protection: I.U.D.  Other Topics Concern   Not on file  Social History Narrative   Not on file   Social Drivers of Health   Financial Resource Strain: Medium Risk (05/07/2023)   Overall Financial Resource Strain (CARDIA)    Difficulty of Paying Living Expenses: Somewhat hard  Food Insecurity: No Food Insecurity (05/07/2023)   Hunger Vital Sign    Worried About Running Out of Food in the Last Year: Never true    Ran Out of Food in the Last Year: Never true  Transportation Needs: No Transportation Needs (05/07/2023)   PRAPARE - Transportation    Lack of Transportation (Medical): No    Lack of  Transportation (Non-Medical): No  Physical Activity: Insufficiently Active (05/07/2023)   Exercise Vital Sign    Days of Exercise per Week: 2 days    Minutes of Exercise per Session: 20 min  Stress: No Stress Concern Present (05/07/2023)   [employer] of Occupational Health - Occupational Stress Questionnaire    Feeling of Stress : Only a little  Social Connections: Moderately Integrated (05/07/2023)   Social Connection and Isolation Panel    Frequency of Communication with Friends and Family: More than three times a week    Frequency of Social Gatherings with Friends and Family: Once a week    Attends Religious Services: More than 4 times per year    Active Member of [employer] or Organizations: Yes    Attends Engineer, structural: More than 4 times per year    Marital Status: Divorced  Catering manager Violence: Not on file     Current Outpatient Medications on File Prior to Visit  Medication Sig Dispense Refill   amLODipine  (NORVASC ) 5 MG tablet Take 1.5 tablets (7.5 mg total) by mouth daily. 135 tablet 3   buPROPion  (WELLBUTRIN  XL) 150 MG 24 hr tablet Take 1 tablet (150 mg total) by mouth daily. 30 tablet 2   fexofenadine  (ALLEGRA ) 180 MG tablet Take 1 tablet (180 mg total) by mouth daily. 90 tablet 1   metFORMIN  (GLUCOPHAGE ) 500 MG tablet Take 2 tablets (1,000 mg total) by mouth daily with supper. 180 tablet 3   metoprolol  succinate (TOPROL -XL) 50 MG 24 hr tablet Take 1 tablet (50 mg total) by mouth daily. Take with or following a meal. 90 tablet 3   telmisartan  (MICARDIS ) 80 MG tablet Take 1 tablet (80 mg total) by mouth daily. 90 tablet 1   zolpidem  (AMBIEN ) 5 MG tablet Take 1 tablet (5 mg total) by mouth at bedtime as needed. 30 tablet 5   [DISCONTINUED] hydrochlorothiazide  (MICROZIDE ) 12.5 MG capsule Take 2 capsules (25 mg total) by mouth daily. 90 capsule 3   [DISCONTINUED] losartan  (COZAAR ) 100 MG tablet TAKE 1 TABLET BY MOUTH ONCE DAILY WITH HCTZ 90 tablet 3   No current facility-administered medications on file prior to visit.    Allergies  Allergen Reactions   Oyster Extract      Review of Systems Constitutional: negative for chills, fatigue, fevers and sweats Eyes: negative for irritation, redness and visual disturbance Ears, nose, mouth, throat, and face: negative for hearing loss, nasal congestion, snoring and tinnitus Respiratory: negative for asthma, cough, sputum Cardiovascular: negative for chest pain, dyspnea, exertional chest pressure/discomfort, irregular heart beat, palpitations and syncope Gastrointestinal: negative for abdominal pain, change in bowel habits, nausea and vomiting Genitourinary: negative for abnormal menstrual periods, genital lesions, sexual problems and vaginal discharge, dysuria and urinary incontinence Integument/breast: negative for breast lump, breast  tenderness and nipple discharge Hematologic/lymphatic: negative for bleeding and easy bruising Musculoskeletal:negative for back pain and muscle weakness Neurological: negative for dizziness, headaches, vertigo and weakness Endocrine: negative for diabetic symptoms including polydipsia, polyuria and skin dryness Allergic/Immunologic: negative for hay fever and urticaria      Objective:  Blood pressure 111/80, pulse 74, resp. rate 16, height 5' 6 (1.676 m), weight 258 lb 4.8 oz (117.2 kg). Body mass index is 41.69 kg/m.    General Appearance:    Alert, cooperative, no distress, appears stated age  Head:    Normocephalic, without obvious abnormality, atraumatic  Eyes:    PERRL, conjunctiva/corneas clear, EOM's intact, both eyes  Ears:    Normal external  ear canals, both ears  Nose:   Nares normal, septum midline, mucosa normal, no drainage or sinus tenderness  Throat:   Lips, mucosa, and tongue normal; teeth and gums normal  Neck:   Supple, symmetrical, trachea midline, no adenopathy; thyroid : no enlargement/tenderness/nodules; no carotid bruit or JVD  Back:     Symmetric, no curvature, ROM normal, no CVA tenderness  Lungs:     Clear to auscultation bilaterally, respirations unlabored  Chest Wall:    No tenderness or deformity   Heart:    Regular rate and rhythm, S1 and S2 normal, no murmur, rub or gallop  Breast Exam:    No tenderness, masses, or nipple abnormality  Abdomen:     Soft, non-tender, bowel sounds active all four quadrants, no masses, no organomegaly.    Genitalia:    Pelvic:external genitalia normal, vagina without lesions, discharge, or tenderness, rectovaginal septum  normal. Cervix normal in appearance, no cervical motion tenderness, no adnexal masses or tenderness.  Uterus normal size, shape, mobile, regular contours, nontender.  Rectal:    Normal external sphincter.  No hemorrhoids appreciated. Internal exam not done.   Extremities:   Extremities normal, atraumatic, no  cyanosis or edema  Pulses:   2+ and symmetric all extremities  Skin:   Skin color, texture, turgor normal, no rashes or lesions  Lymph nodes:   Cervical, supraclavicular, and axillary nodes normal  Neurologic:   CNII-XII intact, normal strength, sensation and reflexes throughout   .  Labs:  Lab Results  Component Value Date   WBC 3.9 (L) 11/10/2023   HGB 13.3 11/10/2023   HCT 39.7 11/10/2023   MCV 93.8 11/10/2023   PLT 338.0 11/10/2023    Lab Results  Component Value Date   CREATININE 0.85 11/10/2023   BUN 8 11/10/2023   NA 138 11/10/2023   K 4.4 11/10/2023   CL 105 11/10/2023   CO2 25 11/10/2023    Lab Results  Component Value Date   ALT 18 11/10/2023   AST 19 11/10/2023   ALKPHOS 50 11/10/2023   BILITOT 0.5 11/10/2023    Lab Results  Component Value Date   TSH 1.46 11/10/2023     Assessment:   1. Encounter for well woman exam with routine gynecological exam   2. Encounter for screening mammogram for malignant neoplasm of breast      Plan:  Blood tests: UTD. Breast self exam technique reviewed and patient encouraged to perform self-exam monthly. Contraception: IUD. Discussed healthy lifestyle modifications. Mammogram ordered Pap smear UTD. Flu vaccine: At work Follow up in 1 year for annual exam   Damien Parsley, CNM [facility]

## 2024-02-29 MED FILL — Zolpidem Tartrate Tab 5 MG: ORAL | 30 days supply | Qty: 30 | Fill #3 | Status: AC

## 2024-03-01 ENCOUNTER — Other Ambulatory Visit: Payer: Self-pay

## 2024-03-01 ENCOUNTER — Ambulatory Visit: Admitting: Certified Nurse Midwife

## 2024-03-01 ENCOUNTER — Encounter: Payer: Self-pay | Admitting: Certified Nurse Midwife

## 2024-03-01 VITALS — BP 111/80 | HR 74 | Resp 16 | Ht 66.0 in | Wt 258.3 lb

## 2024-03-01 DIAGNOSIS — Z01419 Encounter for gynecological examination (general) (routine) without abnormal findings: Secondary | ICD-10-CM

## 2024-03-01 DIAGNOSIS — Z1231 Encounter for screening mammogram for malignant neoplasm of breast: Secondary | ICD-10-CM

## 2024-03-30 ENCOUNTER — Other Ambulatory Visit: Payer: Self-pay

## 2024-03-30 MED FILL — Zolpidem Tartrate Tab 5 MG: ORAL | 30 days supply | Qty: 30 | Fill #4 | Status: AC

## 2024-04-12 ENCOUNTER — Other Ambulatory Visit: Payer: Self-pay

## 2024-04-12 MED ORDER — TRIAMCINOLONE ACETONIDE 0.1 % MT PSTE
PASTE | Freq: Three times a day (TID) | OROMUCOSAL | 0 refills | Status: AC
Start: 2024-04-12 — End: ?
  Filled 2024-04-12: qty 5, 10d supply, fill #0

## 2024-04-13 ENCOUNTER — Other Ambulatory Visit: Payer: Self-pay

## 2024-04-27 MED FILL — Zolpidem Tartrate Tab 5 MG: ORAL | 30 days supply | Qty: 30 | Fill #5 | Status: CN

## 2024-04-28 ENCOUNTER — Other Ambulatory Visit: Payer: Self-pay

## 2024-04-30 MED FILL — Zolpidem Tartrate Tab 5 MG: ORAL | 30 days supply | Qty: 30 | Fill #5 | Status: AC

## 2024-05-26 ENCOUNTER — Other Ambulatory Visit: Payer: Self-pay | Admitting: Internal Medicine

## 2024-05-27 ENCOUNTER — Other Ambulatory Visit: Payer: Self-pay

## 2024-05-27 MED ORDER — ZOLPIDEM TARTRATE 5 MG PO TABS
5.0000 mg | ORAL_TABLET | Freq: Every evening | ORAL | 5 refills | Status: AC | PRN
  Filled 2024-05-27: qty 30, 30d supply, fill #0
  Filled 2024-06-27: qty 30, 30d supply, fill #1
  Filled ????-??-??: fill #0

## 2024-05-28 ENCOUNTER — Other Ambulatory Visit: Payer: Self-pay

## 2024-06-07 ENCOUNTER — Other Ambulatory Visit: Payer: Self-pay | Admitting: Internal Medicine

## 2024-06-08 ENCOUNTER — Other Ambulatory Visit: Payer: Self-pay

## 2024-06-08 MED ORDER — METOPROLOL SUCCINATE ER 50 MG PO TB24
50.0000 mg | ORAL_TABLET | Freq: Every day | ORAL | 3 refills | Status: AC
  Filled 2024-06-08: qty 90, 90d supply, fill #0

## 2024-06-28 ENCOUNTER — Other Ambulatory Visit: Payer: Self-pay

## 2024-06-29 ENCOUNTER — Ambulatory Visit: Admitting: Internal Medicine

## 2024-06-29 ENCOUNTER — Other Ambulatory Visit: Payer: Self-pay

## 2024-06-29 ENCOUNTER — Encounter: Payer: Self-pay | Admitting: Internal Medicine

## 2024-06-29 ENCOUNTER — Ambulatory Visit: Attending: Internal Medicine

## 2024-06-29 VITALS — BP 120/76 | HR 61 | Ht 66.0 in | Wt 257.0 lb

## 2024-06-29 DIAGNOSIS — E161 Other hypoglycemia: Secondary | ICD-10-CM

## 2024-06-29 DIAGNOSIS — E876 Hypokalemia: Secondary | ICD-10-CM

## 2024-06-29 DIAGNOSIS — M47816 Spondylosis without myelopathy or radiculopathy, lumbar region: Secondary | ICD-10-CM | POA: Diagnosis not present

## 2024-06-29 DIAGNOSIS — M25552 Pain in left hip: Secondary | ICD-10-CM

## 2024-06-29 DIAGNOSIS — T502X5A Adverse effect of carbonic-anhydrase inhibitors, benzothiadiazides and other diuretics, initial encounter: Secondary | ICD-10-CM | POA: Diagnosis not present

## 2024-06-29 DIAGNOSIS — M5442 Lumbago with sciatica, left side: Secondary | ICD-10-CM

## 2024-06-29 DIAGNOSIS — I10 Essential (primary) hypertension: Secondary | ICD-10-CM | POA: Diagnosis not present

## 2024-06-29 DIAGNOSIS — G8929 Other chronic pain: Secondary | ICD-10-CM

## 2024-06-29 DIAGNOSIS — M5416 Radiculopathy, lumbar region: Secondary | ICD-10-CM

## 2024-06-29 DIAGNOSIS — R002 Palpitations: Secondary | ICD-10-CM

## 2024-06-29 MED ORDER — TELMISARTAN 80 MG PO TABS
80.0000 mg | ORAL_TABLET | Freq: Every day | ORAL | 1 refills | Status: AC
  Filled 2024-06-29: qty 90, 90d supply, fill #0

## 2024-06-29 MED ORDER — CELECOXIB 200 MG PO CAPS
200.0000 mg | ORAL_CAPSULE | Freq: Every day | ORAL | 1 refills | Status: AC
  Filled 2024-06-29: qty 90, 90d supply, fill #0

## 2024-06-29 MED ORDER — AMLODIPINE BESYLATE 5 MG PO TABS
7.5000 mg | ORAL_TABLET | Freq: Every day | ORAL | 3 refills | Status: AC
  Filled 2024-06-29: qty 135, 90d supply, fill #0

## 2024-06-29 NOTE — Patient Instructions (Addendum)
 For your leg/hip pain:  You can take 3000 mg of acetominophen (tylenol) every day safely  In divided doses (1000 mg every 8 hours.) start with 1000 mg every 12 hours.  Add a bedtime dose if needed   Instead of aleve and advil  , use celebrex  once daily  at bedtime  .  A second dose can be added 12 hours later if needed   Referral to Delon Gins in process  I am ordering a ZIO monitor for you to wear for 2 weeks to monitor your heart rhythms .  It will come to your house and you can apply it to your chest wall.  If you have any trouble applying it you can schedule a RN visit in our office and we will help you

## 2024-06-29 NOTE — Progress Notes (Unsigned)
 "  Subjective:  Patient ID: Melanie Chavez, female    DOB: 08/15/1970  Age: 54 y.o. MRN: [id number]  CC: The primary encounter diagnosis was Chronic left hip pain. Diagnoses of Essential hypertension, Palpitations, Chronic left-sided low back pain with left-sided sciatica, Lumbar spondylosis, Lumbar radiculopathy, Diuretic-induced hypokalemia, Reactive hypoglycemia, and Intermittent palpitations were also pertinent to this visit.   HPI Melanie Chavez presents for  Chief Complaint  Patient presents with   Hip Pain   1) bilateral hip pain with pain radiating down left leg .  Pain has been present for he past year . Struggles to get out of bed.  Taks aspercreme in theam and Icy Hot in the pm.  Also taking ibuprofen  400 to 600 mg not more than twice daily.  Has not tried tylenol .  Melanie Chavez has lumbar spondylosis with history of ESI providing relef in April 2022.   2) recurrent episodes of palpitations occurring at rest  pulse of 100 noted ; no chest pain ,  no dyspnea or presyncope. Lasts a few minutes  per episode , several times per day,  but not every day      Outpatient Medications Prior to Visit  Medication Sig Dispense Refill   metFORMIN  (GLUCOPHAGE ) 500 MG tablet Take 2 tablets (1,000 mg total) by mouth daily with supper. 180 tablet 3   metoprolol  succinate (TOPROL -XL) 50 MG 24 hr tablet Take 1 tablet (50 mg total) by mouth daily. Take with or following a meal. 90 tablet 3   triamcinolone  (KENALOG ) 0.1 % paste Apply to lesions 4 (four) times daily - after meals and at bedtime until healed. 5 g 0   zolpidem  (AMBIEN ) 5 MG tablet Take 1 tablet (5 mg total) by mouth at bedtime as needed. 30 tablet 5   amLODipine  (NORVASC ) 5 MG tablet Take 1.5 tablets (7.5 mg total) by mouth daily. 135 tablet 3   telmisartan  (MICARDIS ) 80 MG tablet Take 1 tablet (80 mg total) by mouth daily. 90 tablet 1   buPROPion  (WELLBUTRIN  XL) 150 MG 24 hr tablet Take 1 tablet (150 mg total) by mouth daily. 30  tablet 2   fexofenadine  (ALLEGRA ) 180 MG tablet Take 1 tablet (180 mg total) by mouth daily. 90 tablet 1   hydrochlorothiazide  (MICROZIDE ) 12.5 MG capsule Take 2 capsules (25 mg total) by mouth daily. 90 capsule 3   No facility-administered medications prior to visit.    Review of Systems;  Patient denies headache, fevers, malaise, unintentional weight loss, skin rash, eye pain, sinus congestion and sinus pain, sore throat, dysphagia,  hemoptysis , cough, dyspnea, wheezing, chest pain, palpitations, orthopnea, edema, abdominal pain, nausea, melena, diarrhea, constipation, flank pain, dysuria, hematuria, urinary  Frequency, nocturia, numbness, tingling, seizures,  Focal weakness, Loss of consciousness,  Tremor, insomnia, depression, anxiety, and suicidal ideation.      Objective:  BP 120/76   Pulse 61   Ht 5' 6 (1.676 m)   Wt 257 lb (116.6 kg)   SpO2 99%   BMI 41.48 kg/m   BP Readings from Last 3 Encounters:  06/29/24 120/76  03/01/24 111/80  11/10/23 122/82    Wt Readings from Last 3 Encounters:  06/29/24 257 lb (116.6 kg)  03/01/24 258 lb 4.8 oz (117.2 kg)  11/10/23 252 lb 6.4 oz (114.5 kg)    Physical Exam Vitals reviewed.  Constitutional:      General: She is not in acute distress.    Appearance: Normal appearance. She is normal weight. She is  not ill-appearing, toxic-appearing or diaphoretic.  HENT:     Head: Normocephalic.  Eyes:     General: No scleral icterus.       Right eye: No discharge.        Left eye: No discharge.     Conjunctiva/sclera: Conjunctivae normal.  Cardiovascular:     Rate and Rhythm: Normal rate and regular rhythm.     Heart sounds: Normal heart sounds.  Pulmonary:     Effort: Pulmonary effort is normal. No respiratory distress.     Breath sounds: Normal breath sounds.  Musculoskeletal:        General: Normal range of motion.     Right hip: Normal. Normal range of motion.     Left hip: Normal. Normal range of motion.  Skin:     General: Skin is warm and dry.  Neurological:     General: No focal deficit present.     Mental Status: She is alert and oriented to person, place, and time. Mental status is at baseline.     Motor: Motor function is intact. No weakness, tremor, atrophy or abnormal muscle tone.     Comments: Positive straight leg lift , left side   Psychiatric:        Mood and Affect: Mood normal.        Behavior: Behavior normal.        Thought Content: Thought content normal.        Judgment: Judgment normal.     Lab Results  Component Value Date   HGBA1C 5.3 06/29/2024   HGBA1C 5.4 11/10/2023   HGBA1C 5.1 09/09/2022    Lab Results  Component Value Date   CREATININE 1.04 06/29/2024   CREATININE 0.85 11/10/2023   CREATININE 0.76 05/07/2023    Lab Results  Component Value Date   WBC 5.1 06/29/2024   HGB 13.0 06/29/2024   HCT 38.4 06/29/2024   PLT 378.0 06/29/2024   GLUCOSE 88 06/29/2024   CHOL 127 11/10/2023   TRIG 86.0 11/10/2023   HDL 46.60 11/10/2023   LDLDIRECT 74.0 11/10/2023   LDLCALC 64 11/10/2023   ALT 26 06/29/2024   AST 24 06/29/2024   NA 138 06/29/2024   K 3.9 06/29/2024   CL 105 06/29/2024   CREATININE 1.04 06/29/2024   BUN 11 06/29/2024   CO2 26 06/29/2024   TSH 1.50 06/29/2024   HGBA1C 5.3 06/29/2024   MICROALBUR 1.0 11/10/2023    MM 3D SCREEN BREAST BILATERAL Result Date: 05/14/2022 CLINICAL DATA:  Screening. EXAM: DIGITAL SCREENING BILATERAL MAMMOGRAM WITH TOMOSYNTHESIS AND CAD TECHNIQUE: Bilateral screening digital craniocaudal and mediolateral oblique mammograms were obtained. Bilateral screening digital breast tomosynthesis was performed. The images were evaluated with computer-aided detection. COMPARISON:  Previous exam(s). ACR Breast Density Category b: There are scattered areas of fibroglandular density. FINDINGS: There are no findings suspicious for malignancy. IMPRESSION: No mammographic evidence of malignancy. A result letter of this screening mammogram  will be mailed directly to the patient. RECOMMENDATION: Screening mammogram in one year. (Code:SM-B-01Y) BI-RADS CATEGORY  1: Negative. Electronically Signed   By: Serena  Chacko M.D.   On: 05/14/2022 08:38    Assessment & Plan:  .Chronic left hip pain Assessment & Plan: ROM is normal.  Plain films ordered to rule out DJd  Orders: -     Celecoxib ; Take 1 capsule (200 mg total) by mouth at bedtime.  Dispense: 90 capsule; Refill: 1 -     DG HIP UNILAT W OR W/O PELVIS 2-3 VIEWS LEFT;  Future  Essential hypertension -     amLODIPine  Besylate; Take 1.5 tablets (7.5 mg total) by mouth daily.  Dispense: 135 tablet; Refill: 3 -     Telmisartan ; Take 1 tablet (80 mg total) by mouth daily.  Dispense: 90 tablet; Refill: 1  Palpitations -     EKG 12-Lead -     LONG TERM MONITOR (3-14 DAYS); Future -     TSH -     CBC with Differential/Platelet  Chronic left-sided low back pain with left-sided sciatica -     Ambulatory referral to Sports Medicine  Lumbar spondylosis -     Ambulatory referral to Sports Medicine  Lumbar radiculopathy Assessment & Plan: With left sided sciatica.  Referring to Delon Gins for [facility]  Orders: -     Ambulatory referral to Sports Medicine  Diuretic-induced hypokalemia  Reactive hypoglycemia -     Comprehensive metabolic panel with GFR -     Hemoglobin A1c  Intermittent palpitations Assessment & Plan: I have ordered and reviewed a 12 lead EKG and find that there are no acute changes and patient is in sinus rhythm.    ZIO monitor ordered tp rule out arrhythmia.  Lytes  CBC and thyroid  function are normal  Lab Results  Component Value Date   TSH 1.50 06/29/2024   Lab Results  Component Value Date   NA 138 06/29/2024   K 3.9 06/29/2024   CL 105 06/29/2024   CO2 26 06/29/2024   This SmartLink has not been configured with any valid records.    Lab Results  Component Value Date   WBC 5.1 06/29/2024   HGB 13.0 06/29/2024   HCT 38.4 06/29/2024   MCV  94.6 06/29/2024   PLT 378.0 06/29/2024       Follow-up: Return in about 6 months (around 12/27/2024).   Melanie LITTIE Kettering, MD "

## 2024-06-30 DIAGNOSIS — R002 Palpitations: Secondary | ICD-10-CM | POA: Insufficient documentation

## 2024-06-30 DIAGNOSIS — G8929 Other chronic pain: Secondary | ICD-10-CM | POA: Insufficient documentation

## 2024-06-30 LAB — CBC WITH DIFFERENTIAL/PLATELET
Basophils Absolute: 0 K/uL (ref 0.0–0.1)
Basophils Relative: 0.8 % (ref 0.0–3.0)
Eosinophils Absolute: 0.1 K/uL (ref 0.0–0.7)
Eosinophils Relative: 2.9 % (ref 0.0–5.0)
HCT: 38.4 % (ref 36.0–46.0)
Hemoglobin: 13 g/dL (ref 12.0–15.0)
Lymphocytes Relative: 42.4 % (ref 12.0–46.0)
Lymphs Abs: 2.2 K/uL (ref 0.7–4.0)
MCHC: 33.9 g/dL (ref 30.0–36.0)
MCV: 94.6 fl (ref 78.0–100.0)
Monocytes Absolute: 0.6 K/uL (ref 0.1–1.0)
Monocytes Relative: 12.3 % — ABNORMAL HIGH (ref 3.0–12.0)
Neutro Abs: 2.1 K/uL (ref 1.4–7.7)
Neutrophils Relative %: 41.6 % — ABNORMAL LOW (ref 43.0–77.0)
Platelets: 378 K/uL (ref 150.0–400.0)
RBC: 4.06 Mil/uL (ref 3.87–5.11)
RDW: 13 % (ref 11.5–15.5)
WBC: 5.1 K/uL (ref 4.0–10.5)

## 2024-06-30 LAB — COMPREHENSIVE METABOLIC PANEL WITH GFR
ALT: 26 U/L (ref 3–35)
AST: 24 U/L (ref 5–37)
Albumin: 4.4 g/dL (ref 3.5–5.2)
Alkaline Phosphatase: 55 U/L (ref 39–117)
BUN: 11 mg/dL (ref 6–23)
CO2: 26 meq/L (ref 19–32)
Calcium: 9.4 mg/dL (ref 8.4–10.5)
Chloride: 105 meq/L (ref 96–112)
Creatinine, Ser: 1.04 mg/dL (ref 0.40–1.20)
GFR: 61.89 mL/min
Glucose, Bld: 88 mg/dL (ref 70–99)
Potassium: 3.9 meq/L (ref 3.5–5.1)
Sodium: 138 meq/L (ref 135–145)
Total Bilirubin: 0.4 mg/dL (ref 0.2–1.2)
Total Protein: 7.5 g/dL (ref 6.0–8.3)

## 2024-06-30 LAB — HEMOGLOBIN A1C: Hgb A1c MFr Bld: 5.3 % (ref 4.6–6.5)

## 2024-06-30 LAB — TSH: TSH: 1.5 u[IU]/mL (ref 0.35–5.50)

## 2024-06-30 NOTE — Assessment & Plan Note (Signed)
 With left sided sciatica.  Referring to Delon Gins for [facility]

## 2024-06-30 NOTE — Assessment & Plan Note (Addendum)
 I have ordered and reviewed a 12 lead EKG and find that there are no acute changes and patient is in sinus rhythm.    ZIO monitor ordered tp rule out arrhythmia.  Lytes  CBC and thyroid  function are normal  Lab Results  Component Value Date   TSH 1.50 06/29/2024   Lab Results  Component Value Date   NA 138 06/29/2024   K 3.9 06/29/2024   CL 105 06/29/2024   CO2 26 06/29/2024   This SmartLink has not been configured with any valid records.    Lab Results  Component Value Date   WBC 5.1 06/29/2024   HGB 13.0 06/29/2024   HCT 38.4 06/29/2024   MCV 94.6 06/29/2024   PLT 378.0 06/29/2024

## 2024-06-30 NOTE — Assessment & Plan Note (Signed)
 ROM is normal.  Plain films ordered to rule out DJd

## 2024-07-03 ENCOUNTER — Ambulatory Visit: Payer: Self-pay | Admitting: Internal Medicine

## 2024-07-14 ENCOUNTER — Other Ambulatory Visit: Payer: Self-pay

## 2024-07-14 MED ORDER — GABAPENTIN 100 MG PO CAPS
ORAL_CAPSULE | ORAL | 0 refills | Status: AC
  Filled 2024-07-14: qty 90, 34d supply, fill #0

## 2024-07-15 ENCOUNTER — Encounter: Payer: Self-pay | Admitting: Physical Medicine & Rehabilitation

## 2024-07-15 ENCOUNTER — Other Ambulatory Visit: Payer: Self-pay | Admitting: Physical Medicine & Rehabilitation

## 2024-07-15 DIAGNOSIS — R29898 Other symptoms and signs involving the musculoskeletal system: Secondary | ICD-10-CM

## 2024-07-15 DIAGNOSIS — M5416 Radiculopathy, lumbar region: Secondary | ICD-10-CM
